# Patient Record
Sex: Female | Born: 2000 | Race: White | Hispanic: No | Marital: Married | Smoking: Never smoker
Health system: Southern US, Community
[De-identification: ages and names within clinical notes are randomized; demographics above are authoritative.]

## PROBLEM LIST (undated history)

## (undated) ENCOUNTER — Inpatient Hospital Stay (HOSPITAL_COMMUNITY): Payer: Self-pay

## (undated) DIAGNOSIS — J45909 Unspecified asthma, uncomplicated: Secondary | ICD-10-CM

## (undated) DIAGNOSIS — K219 Gastro-esophageal reflux disease without esophagitis: Secondary | ICD-10-CM

## (undated) DIAGNOSIS — Z87440 Personal history of urinary (tract) infections: Secondary | ICD-10-CM

## (undated) DIAGNOSIS — B9689 Other specified bacterial agents as the cause of diseases classified elsewhere: Secondary | ICD-10-CM

## (undated) DIAGNOSIS — N760 Acute vaginitis: Secondary | ICD-10-CM

## (undated) HISTORY — DX: Other specified bacterial agents as the cause of diseases classified elsewhere: N76.0

## (undated) HISTORY — DX: Other specified bacterial agents as the cause of diseases classified elsewhere: B96.89

## (undated) HISTORY — DX: Gastro-esophageal reflux disease without esophagitis: K21.9

## (undated) HISTORY — DX: Unspecified asthma, uncomplicated: J45.909

## (undated) HISTORY — PX: NO PAST SURGERIES: SHX2092

## (undated) HISTORY — PX: WISDOM TOOTH EXTRACTION: SHX21

---
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
## 2020-01-03 ENCOUNTER — Telehealth: Payer: Self-pay

## 2020-01-03 NOTE — Telephone Encounter (Signed)
Called and spoke with patient.  She states that for the last 2 days, she has been feeling overall weak and having body aches.  Also reports that she has been feeling very warm and waking up around 4 in the morning drenched in sweat for the last 2 days.  No known sick contacts, no other symptoms.  States that she has been breathing well, able to eat and drink normally.  She does not have any known Covid exposure.  She has been vaccinated for Covid.  Advised that given current COVID-19 pandemic, it would be important to rule out Covid as the diagnosis.  We will have her get tested for this.  Offered her the information through [facility], but she states that she has her own test that she will be using.  Also advised that she can continue to take Motrin 400 mg every 6 hours and can alternate with Tylenol.  ED/return precautions given including worsening symptoms, no improvement over the next week, development of shortness of breath, decreased p.o. intake.  She voiced understanding.  She will let us know if her symptoms do not improve.

## 2020-01-03 NOTE — Telephone Encounter (Signed)
Patient calls nurse line reporting back pain for the past 2 days. Patients reports she has been feeling weak and has been waking up "hot," however unsure if she has a fever. Patient denies any cough, congestion, or SOB. Patient denies any sick contacts or exposures. Patient has been taking (2) 200mg  Motrin 2x daily with no relief and using a heating pad with minimal relief. I advised patient to call back if she develops a fever or any other symptoms. In the meantime, she is wondering if she can increase Motrin or take something else specifically for her back pain. Please advise.

## 2020-01-28 ENCOUNTER — Ambulatory Visit (INDEPENDENT_AMBULATORY_CARE_PROVIDER_SITE_OTHER): Payer: Medicaid Other | Admitting: Family Medicine

## 2020-01-28 ENCOUNTER — Other Ambulatory Visit: Payer: Self-pay

## 2020-01-28 VITALS — BP 100/68 | HR 77 | Ht 66.0 in | Wt 165.8 lb

## 2020-01-28 DIAGNOSIS — F32A Depression, unspecified: Secondary | ICD-10-CM

## 2020-01-28 DIAGNOSIS — F419 Anxiety disorder, unspecified: Secondary | ICD-10-CM | POA: Diagnosis not present

## 2020-01-28 DIAGNOSIS — E611 Iron deficiency: Secondary | ICD-10-CM | POA: Diagnosis not present

## 2020-01-28 DIAGNOSIS — M545 Low back pain, unspecified: Secondary | ICD-10-CM | POA: Insufficient documentation

## 2020-01-28 MED ORDER — PRENATAL/IRON PO TABS: 1.0000 | ORAL_TABLET | Freq: Every day | ORAL | 3 refills | Status: DC

## 2020-01-28 NOTE — Assessment & Plan Note (Signed)
Given that she has not been able to tolerate ferrous sulfate by mouth even when separated out and taken with food, will go ahead and change her to prenatal vitamins with iron.  When she is able to tolerate for at least 6 weeks, then can recheck levels.

## 2020-01-28 NOTE — Assessment & Plan Note (Signed)
No red flag symptoms.  Does not seem to have any red flag symptoms on exam.  This seems to be muscular pain.  Discussed strengthening of core muscles and continued stretching.  She will work on this and follow-up as needed.  Can continue to use heat.  Can also use ibuprofen as needed.

## 2020-01-28 NOTE — Assessment & Plan Note (Signed)
Overall, her symptoms do seem to be improving, however she is having a grief reaction secondary to the loss of her uncle.  No SI.  She does not want to make changes to her medications at this time.  She is thinking about therapy, continue to encourage this.  She reports that she has a list on her my chart and will use this.  Follow-up in 1 month.

## 2020-01-28 NOTE — Patient Instructions (Signed)
Thank you for coming to see me today. It was a pleasure. Today we talked about:   Keep taking your medications for anxiety.  You can take Buspar twice daily as needed, you don't have to schedule it every day.  Please call a therapist to schedule and appointment.  Take the prenatal vitamins for your iron.  Stretch your back every day and work on core strengthening.  Please follow-up with me in 1 month.  If you have any questions or concerns, please do not hesitate to call the office at [phone].  Best,   Luis Abed, DO

## 2020-01-28 NOTE — Progress Notes (Addendum)
SUBJECTIVE:   CHIEF COMPLAINT / HPI:   Follow-up anxiety Last seen on 12/25/2019 Has been on Zoloft 50 mg daily, added BuSpar 5 mg twice daily on 9/28 She reports that overall she feels that the medication is working, however she is having a grief reaction due to the loss of her uncle 2 months ago She states that she thought she would be better by now, but is starting to think about therapy given that this continues to be a problem for her and she continues to struggle with the loss She denies SI GAD 7 : Generalized Anxiety Score 01/28/2020 12/25/2019 11/12/2019 10/03/2019  Nervous, Anxious, on Edge 1 2 2 1   Control/stop worrying 1 2 3 3   Worry too much - different things 1 2 3 3   Trouble relaxing 1 2 2 2   Restless 0 0 0 0  Easily annoyed or irritable 2 2 1 1   Afraid - awful might happen 1 1 2 2   Total GAD 7 Score 7 11 13 12   Anxiety Difficulty - Very difficult Very difficult Extremely difficult      Office Visit from 01/28/2020 in [facility]  PHQ-9 Total Score 7     Iron deficiency Patient has been attempting to take ferrous sulfate every other day but continues to have abdominal pain and discomfort from this  Back Pain 1.5 months Feels tight on both sides of her back No radiation of pain, numbness and tingling in her legs, saddle anesthesias, bowel or bladder habit changes, fevers Reports that the pain feels very tight and sore, sometimes a stabbing pain in her back No injury that she can think of Tries to work on stretching, sometimes makes the pain feel a little bit worse when she stretches forward Has been using a heating pad which helps for a little bit, then the pain comes back  PERTINENT  PMH / PSH: Hashimoto's thyroiditis, chronic enteritis, vitamin D deficiency, anxiety and depression, iron deficiency  OBJECTIVE:   BP 100/68   Pulse 77   Ht 5\' 6"  (1.676 m)   Wt 165 lb 12.8 oz (75.2 kg)   LMP 01/08/2020   SpO2 99%   BMI 26.76 kg/m     Physical Exam:  General: 19 y.o. female in NAD Lungs: Breathing comfortably on room air Skin: warm and dry Psych: Intermittently tearful throughout exam when speaking about the loss of her uncle, appropriate dress, no SI, thought process linear and logical  Back exam: Inspection: No gross deformities of lumbar spine noted, no overlying skin changes. Palpation: No tenderness palpation along lumbar spinous processes, no step-off noted.  Mild tenderness to palpation of paraspinal musculature bilaterally in lumbar region. ROM: Full ROM in all fields of low back, some tightness with flexion, mild pain with extension, no radiating symptoms Strength: 5/5 strength BLE Special Tests: Negative seated straight leg raise and straight leg raise bilaterally, equivocal stork sign Neurovascular: Intact distally bilaterally    ASSESSMENT/PLAN:   Anxiety and depression Overall, her symptoms do seem to be improving, however she is having a grief reaction secondary to the loss of her uncle.  No SI.  She does not want to make changes to her medications at this time.  She is thinking about therapy, continue to encourage this.  She reports that she has a list on her my chart and will use this.  Follow-up in 1 month.  Iron deficiency Given that she has not been able to tolerate ferrous sulfate by  mouth even when separated out and taken with food, will go ahead and change her to prenatal vitamins with iron.  When she is able to tolerate for at least 6 weeks, then can recheck levels.  Acute bilateral low back pain without sciatica No red flag symptoms.  Does not seem to have any red flag symptoms on exam.  This seems to be muscular pain.  Discussed strengthening of core muscles and continued stretching.  She will work on this and follow-up as needed.  Can continue to use heat.  Can also use ibuprofen as needed.     Unknown Jim, DO [facility]

## 2020-01-29 ENCOUNTER — Other Ambulatory Visit: Payer: Self-pay | Admitting: *Deleted

## 2020-01-29 DIAGNOSIS — F32A Depression, unspecified: Secondary | ICD-10-CM

## 2020-01-29 DIAGNOSIS — F419 Anxiety disorder, unspecified: Secondary | ICD-10-CM

## 2020-01-29 MED ORDER — SERTRALINE HCL 50 MG PO TABS: 50.0000 mg | ORAL_TABLET | Freq: Every day | ORAL | 2 refills | Status: DC

## 2020-02-18 ENCOUNTER — Other Ambulatory Visit: Payer: Self-pay

## 2020-02-18 ENCOUNTER — Encounter: Payer: Self-pay | Admitting: Family Medicine

## 2020-02-18 ENCOUNTER — Ambulatory Visit (INDEPENDENT_AMBULATORY_CARE_PROVIDER_SITE_OTHER): Payer: Medicaid Other | Admitting: Family Medicine

## 2020-02-18 VITALS — BP 106/68 | HR 77 | Ht 66.0 in | Wt 168.2 lb

## 2020-02-18 DIAGNOSIS — F32A Depression, unspecified: Secondary | ICD-10-CM | POA: Diagnosis not present

## 2020-02-18 DIAGNOSIS — F419 Anxiety disorder, unspecified: Secondary | ICD-10-CM

## 2020-02-18 DIAGNOSIS — E611 Iron deficiency: Secondary | ICD-10-CM

## 2020-02-18 MED ORDER — BUSPIRONE HCL 5 MG PO TABS: 5.0000 mg | ORAL_TABLET | Freq: Two times a day (BID) | ORAL | 1 refills | Status: DC

## 2020-02-18 NOTE — Patient Instructions (Signed)
Thank you for coming to see me today. It was a pleasure. Today we talked about:   [facility] would likely be a good option.  Keep taking zoloft.  You can use buspar as needed.  We will recheck your blood levels at the next visit.  Please follow-up with me in 4-6 weeks.  If you have any questions or concerns, please do not hesitate to call the office at [phone].  Best,   Luis Abed, DO   Therapy and Counseling Resources Most providers on this list will take Medicaid. Patients with commercial insurance or Medicare should contact their insurance company to get a list of in network providers.  BestDay:Psychiatry and Counseling [address] [place] [place], [place] [address] [phone]  [facility]  [address] [place], [place] [address]      [phone]  [facility] [address]  [place], [place] [address] [phone]  [facility] [address], [address]  [place], [place] [address]       [phone]      Jovita Kussmaul [facility] [address] [place], [place] [phone]  [facility]:  [address] [place] [phone]  [facility]:  [address] [place]ie Diener [phone]  [facility] (under & uninsured) [address]   [place] [phone]    [email or web address]    [facility] [address] . [place]    [phone]  [facility] -[address]     Phone:  [phone]     [facility]-  [address]  [phone]   [facility] [address]      [place], [place] [phone]  [facility]: [address] [place], [place], [place]  [phone]   [facility] (Spanish therapist) [email or web address]  [address] [place]  [address]   [place]  [place] [address]    [phone]    [facility]   [address],  [place], [place]  [phone]   [facility]  [address] [place]  [phone]  [facility]  [address] [place], [place]        [phone]  Open Access/Walk In Clinic under & uninsured  [facility]  [address] [place], [place] [phone] Crisis [phone]  [facility] [place],  (Spanish)   [address], [place]: [phone]) 8:30 - 12; 1 - 2:30  [facility] [employer],  [address] [place], [place]    [phone]):8:30 - 12; 2 - 3PM  [facility] [employer],  [address]  [place]; [phone]):   Mon - Fri 8 AM - 5 PM  [facility] [address] [place]  MWF 12:30 to 3:00 or call to schedule an appointment  [phone]  Specific Provider options Psychology Today  [email or web address] 1. click on find a therapist  2. enter your zip code 3. left side and select or tailor a therapist for your specific need.   [facility] Provider Directory [email or web address]  (Medicaid)   Follow all drop down to find a provider  Social Support program [facility] [phone] or PhotoSolver.pl [address], [place], [place] Recovery support and educational   24- Hour  Availability:  .  Marland Kitchen [facility]  . [address] [place], [place] [employer] [phone] Crisis [phone]  . [facility] [employer] [phone]  [facility] Service  [phone]   . [facility] [employer]  [phone] (after hours)  . Therapeutic Alternative/Mobile Crisis   [phone]  . [place] National Suicide Hotline  430-533-8144 (TALK)  . Call 911 or go to emergency room  . [employer]  [phone]);  [place] and [employer]    . [facility]  [phone]); [place], [place], [place], [place], [place], [place], [place]

## 2020-02-18 NOTE — Progress Notes (Signed)
    SUBJECTIVE:   CHIEF COMPLAINT / HPI:   Follow-up anxiety and depression Seen on 11/1 Current regimen: Zoloft 50 mg daily, BuSpar 5 mg has used as needed She has used Buspar about 5 times and it helps in the moment when she gets anxious Wanted to schedule therapy, but forgot the list Overall, feels like she is improving No SI    Office Visit from 02/18/2020 in [facility]  PHQ-9 Total Score 6     GAD 7 : Generalized Anxiety Score 02/18/2020 01/28/2020 12/25/2019 11/12/2019  Nervous, Anxious, on Edge 1 1 2 2   Control/stop worrying 1 1 2 3   Worry too much - different things 1 1 2 3   Trouble relaxing 2 1 2 2   Restless 0 0 0 0  Easily annoyed or irritable 1 2 2 1   Afraid - awful might happen 1 1 1 2   Total GAD 7 Score 7 7 11 13   Anxiety Difficulty - - Very difficult Very difficult      Iron deficiency At last visit, she was advised to start prenatal vitamins with iron given that she has been unable to tolerate ferrous sulfate even at every other day dosing She is tolerating this better   PERTINENT  PMH / PSH: Hashimoto's thyroiditis, vitamin D deficiency, anxiety depression, iron deficiency  OBJECTIVE:   BP 106/68   Pulse 77   Ht 5\' 6"  (1.676 m)   Wt 168 lb 3.2 oz (76.3 kg)   LMP 02/03/2020 (Exact Date)   SpO2 100%   BMI 27.15 kg/m    Physical Exam:  General: 19 y.o. female in NAD Lungs: Breathing comfortably on room air Skin: warm and dry Extremities: No edema Psych: Mood and affect appropriate for circumstance, thought process linear and logical, no SI   ASSESSMENT/PLAN:   Anxiety and depression Overall, patient is doing well. She has been using BuSpar as needed sparingly and does well with this. Continue with Zoloft and BuSpar as needed. We will have her call to get scheduled for therapy as well, as she is now open to this. No SI. We will have her follow-up in 4 to 6 weeks.  Iron deficiency Now able to tolerate prenatal vitamins with  iron. When she has been on this for 6 weeks, recheck ferritin.     , DO [facility]

## 2020-02-18 NOTE — Assessment & Plan Note (Signed)
Now able to tolerate prenatal vitamins with iron. When she has been on this for 6 weeks, recheck ferritin.

## 2020-02-18 NOTE — Assessment & Plan Note (Signed)
Overall, patient is doing well. She has been using BuSpar as needed sparingly and does well with this. Continue with Zoloft and BuSpar as needed. We will have her call to get scheduled for therapy as well, as she is now open to this. No SI. We will have her follow-up in 4 to 6 weeks.

## 2020-03-26 ENCOUNTER — Telehealth: Payer: Medicaid Other | Admitting: Family Medicine

## 2020-04-09 ENCOUNTER — Ambulatory Visit: Payer: Medicaid Other | Admitting: Family Medicine

## 2020-04-22 NOTE — Progress Notes (Signed)
SUBJECTIVE:   CHIEF COMPLAINT / HPI:   Anxiety and depression Current regimen: Zoloft 50 mg daily, BuSpar 5 mg as needed Had talked about therapy at last visit and forgot about scheduling Feels like she has been tired the last few weeks and struggles to get out of bed Is working at [employer] for the last few months Feels like mood is doing okay and happy with medicine where it is No SI  Back Pain Doing heavy lifting at work, started in the last few weeks  Pain in lower back Uses a heating pad, but not helping as much as it used to Working on a hard floor in a Scientist, water quality toed shoes with insoles No radiation down her legs Feels a soreness and a tightness No Saddle anaesthesia, changes in bowel or bladder habits No IV drug use, no fevers Has had back pain before, but has been more frequent   Headaches Frontal Wants to shut her eyes Has nausea with it Has blurry vision with this Photophobia, phonophobia Usually happen at night Tries to eat and throws up her food Takes tylenol and it doesn't help because she vomits again Happening every other day the last two weeks Before was a few times a month Usually lasts about an hour, just lays down and closes her eyes, but doesn't sleep because it is too early  Iron deficiency Patient has been on prenatal vitamins since November for iron supplementation as she has been unable to tolerate ferrous sulfate She feels nauseous when she takes them because they are so big She has been taking them though  Covid booster - hasn't gotten it, had COVID over Christmas   PERTINENT  PMH / PSH: Hashimoto's thyroiditis, vitamin D deficiency, iron deficiency  OBJECTIVE:   BP 108/72   Pulse 74   Ht 5\' 6"  (1.676 m)   Wt 170 lb 6.4 oz (77.3 kg)   LMP 04/03/2020   SpO2 99%   BMI 27.50 kg/m    Physical Exam:  General: 20 y.o. female in NAD HEENT: PERRL, EOMI Neck: Supple, full ROM in all fields without pain Lungs: Breathing  comfortably on room air Skin: warm and dry Neuro: CN II through XII grossly intact, sensation intact throughout, 5/5 strength to BUE/BLE  Lumbar spine: - Inspection: no gross deformity or asymmetry, swelling or ecchymosis - Palpation: No TTP over the spinous processes, pain with palpation of bilateral paraspinal musculature and lumbar spine - ROM: full active ROM of the lumbar spine in flexion and extension, pain with extreme of flexion - Strength: 5/5 strength of lower extremity in L4-S1 nerve root distributions b/l; normal gait - Neuro: sensation intact in the L4-S1 nerve root distribution b/l, 2+ L4 and S1 reflexes - Special testing: Negative straight leg raise, negative Stork test    ASSESSMENT/PLAN:   Anxiety and depression No SI.  Seems to be stable, does not want to make any changes in her medications.  She has still not proceeded with therapy, continue to encourage.  We will have her follow-up in 2 months.  Continue Zoloft, BuSpar as needed.  Acute bilateral low back pain without sciatica No red flags on exam or history.  This is likely paraspinal musculature pain secondary to working on hard floors and heavy lifting.  Advised continued stretching which she does at work, also discussed Williams flexion exercises with the patient and to work on core strengthening.  She will follow-up in 2 months if no improvement.  Can use ibuprofen  or Tylenol for her pain.  Also advised continuing heat.  Migraine without aura and without status migrainosus, not intractable Headaches appear consistent with migraines, no aura.  She is neurologically intact on exam.  Given that these have been occurring more frequently in the last few weeks, will start with an abortive therapy, trial Imitrex 50 mg every 2 hours as needed for migraine with no more than use of 2 in 1 day.  If this does not keep her migraines well controlled, could consider controller medication for her.  Follow-up in 2 months.  Iron  deficiency Patient states that she thinks she would be able to tolerate her prenatal vitamins.  We will go ahead and check a ferritin level today to see where we are at.  She has been unable to tolerate iron supplementation in the past.     Unknown Jim, DO [facility]

## 2020-04-23 ENCOUNTER — Ambulatory Visit (INDEPENDENT_AMBULATORY_CARE_PROVIDER_SITE_OTHER): Payer: Medicaid Other | Admitting: Family Medicine

## 2020-04-23 ENCOUNTER — Encounter: Payer: Self-pay | Admitting: Family Medicine

## 2020-04-23 ENCOUNTER — Other Ambulatory Visit: Payer: Self-pay

## 2020-04-23 VITALS — BP 108/72 | HR 74 | Ht 66.0 in | Wt 170.4 lb

## 2020-04-23 DIAGNOSIS — F419 Anxiety disorder, unspecified: Secondary | ICD-10-CM

## 2020-04-23 DIAGNOSIS — G43009 Migraine without aura, not intractable, without status migrainosus: Secondary | ICD-10-CM

## 2020-04-23 DIAGNOSIS — E611 Iron deficiency: Secondary | ICD-10-CM | POA: Diagnosis not present

## 2020-04-23 DIAGNOSIS — F32A Depression, unspecified: Secondary | ICD-10-CM

## 2020-04-23 DIAGNOSIS — M545 Low back pain, unspecified: Secondary | ICD-10-CM

## 2020-04-23 HISTORY — DX: Migraine without aura, not intractable, without status migrainosus: G43.009

## 2020-04-23 MED ORDER — SUMATRIPTAN SUCCINATE 50 MG PO TABS: 50.0000 mg | ORAL_TABLET | ORAL | 0 refills | Status: DC | PRN

## 2020-04-23 NOTE — Assessment & Plan Note (Signed)
Patient states that she thinks she would be able to tolerate her prenatal vitamins.  We will go ahead and check a ferritin level today to see where we are at.  She has been unable to tolerate iron supplementation in the past.

## 2020-04-23 NOTE — Assessment & Plan Note (Signed)
No red flags on exam or history.  This is likely paraspinal musculature pain secondary to working on hard floors and heavy lifting.  Advised continued stretching which she does at work, also discussed Williams flexion exercises with the patient and to work on core strengthening.  She will follow-up in 2 months if no improvement.  Can use ibuprofen or Tylenol for her pain.  Also advised continuing heat.

## 2020-04-23 NOTE — Assessment & Plan Note (Signed)
Headaches appear consistent with migraines, no aura.  She is neurologically intact on exam.  Given that these have been occurring more frequently in the last few weeks, will start with an abortive therapy, trial Imitrex 50 mg every 2 hours as needed for migraine with no more than use of 2 in 1 day.  If this does not keep her migraines well controlled, could consider controller medication for her.  Follow-up in 2 months.

## 2020-04-23 NOTE — Assessment & Plan Note (Signed)
No SI.  Seems to be stable, does not want to make any changes in her medications.  She has still not proceeded with therapy, continue to encourage.  We will have her follow-up in 2 months.  Continue Zoloft, BuSpar as needed.

## 2020-04-23 NOTE — Patient Instructions (Signed)
Thank you for coming to see me today. It was a pleasure. Today we talked about:   Consider getting your COVID booster.  Consider therapy if you would like.    For your back pain, incorporate more stretching with bending forward, laying down and bringing your knees to your chest, stretching your hamstrings, and strengthening your core.  For your headaches, take imitrex at the first sign of a headache.  You can repeat in 2 hrs if not better.  Don't take more than 2 in one day.  If your headaches continue, we can start you on a daily controller medication.  We will get some labs today.  If they are abnormal or we need to do something about them, I will call you.  If they are normal, I will send you a message on MyChart (if it is active) or a letter in the mail.  If you don't hear from [place] in 2 weeks, please call the office at the number below.  Please follow-up with me in 2 months.  If you have any questions or concerns, please do not hesitate to call the office at [phone].  Best,   Luis Abed, DO

## 2020-04-24 LAB — FERRITIN: Ferritin: 19 ng/mL (ref 15–150)

## 2020-04-30 ENCOUNTER — Other Ambulatory Visit: Payer: Self-pay | Admitting: Pulmonary Disease

## 2020-05-08 ENCOUNTER — Other Ambulatory Visit: Payer: Self-pay | Admitting: Family Medicine

## 2020-05-08 DIAGNOSIS — F419 Anxiety disorder, unspecified: Secondary | ICD-10-CM

## 2020-05-08 DIAGNOSIS — F32A Depression, unspecified: Secondary | ICD-10-CM

## 2020-05-12 ENCOUNTER — Other Ambulatory Visit: Payer: Self-pay | Admitting: *Deleted

## 2020-05-12 DIAGNOSIS — F419 Anxiety disorder, unspecified: Secondary | ICD-10-CM

## 2020-05-12 MED ORDER — SERTRALINE HCL 50 MG PO TABS: 50.0000 mg | ORAL_TABLET | Freq: Every day | ORAL | 2 refills | Status: DC

## 2020-05-13 IMAGING — US US RENAL
1 series · 14 of 25 positions shown · non-contrast
Comparison: None.

CLINICAL DATA: 17-year-old female with RIGHT flank pain for 2 days.

EXAM:
RENAL / URINARY TRACT ULTRASOUND COMPLETE

[Series 1: us renal · 0.18mm/px · 14 of 34 slices shown]
[im 1/34]
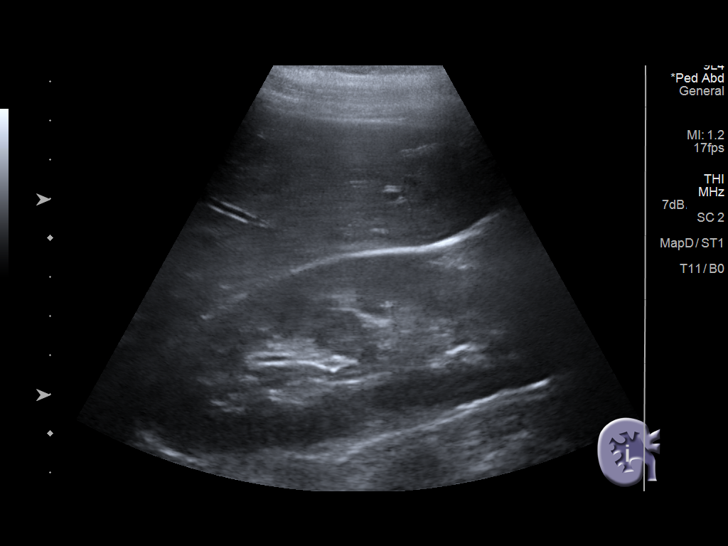
[im 3/34]
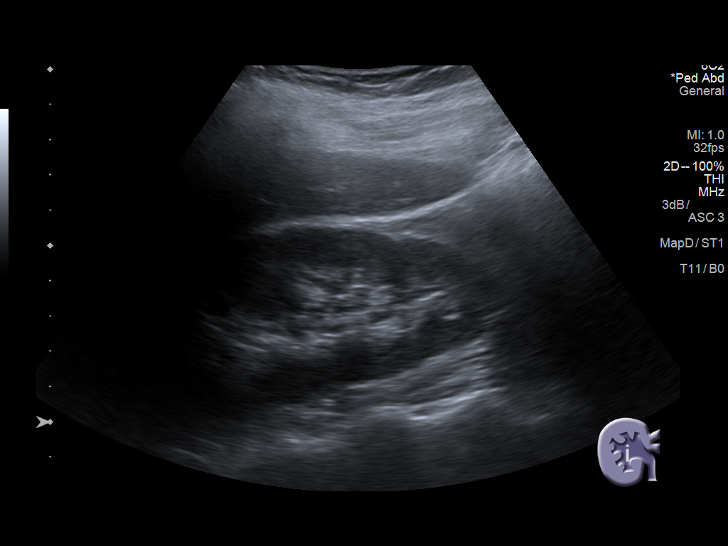
[im 6/34]
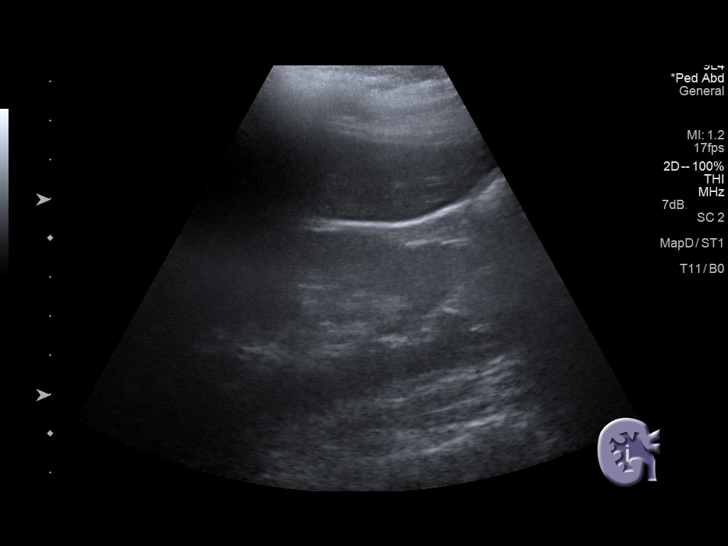
[im 9/34]
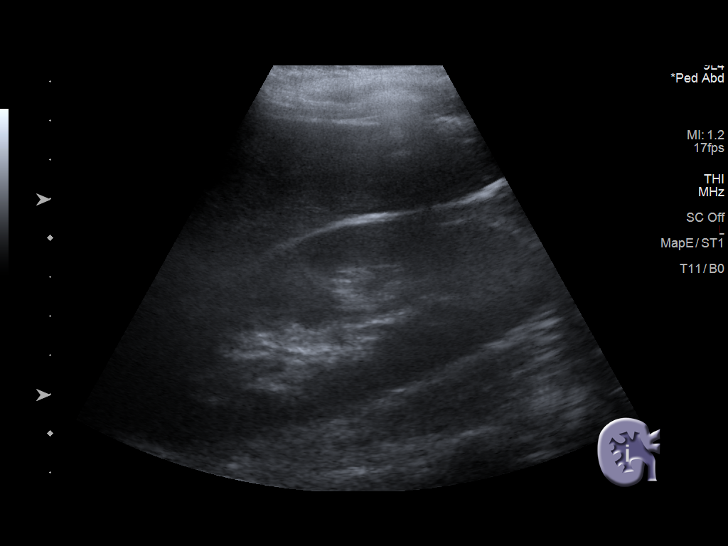
[im 12/34]
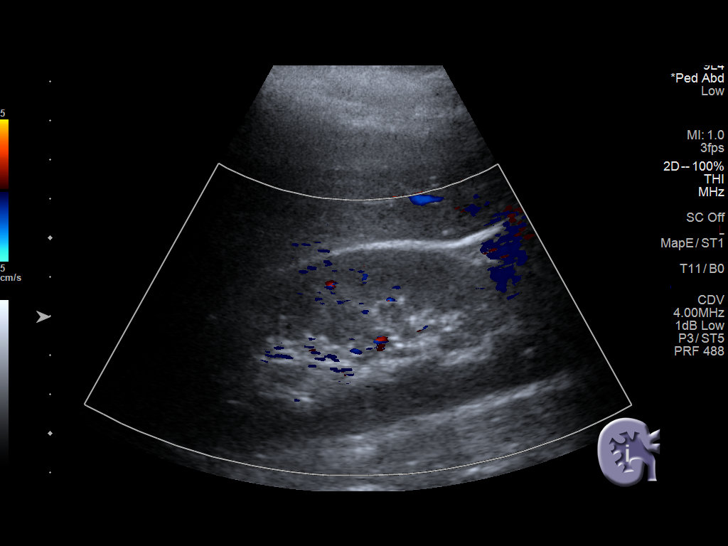
[im 13/34]
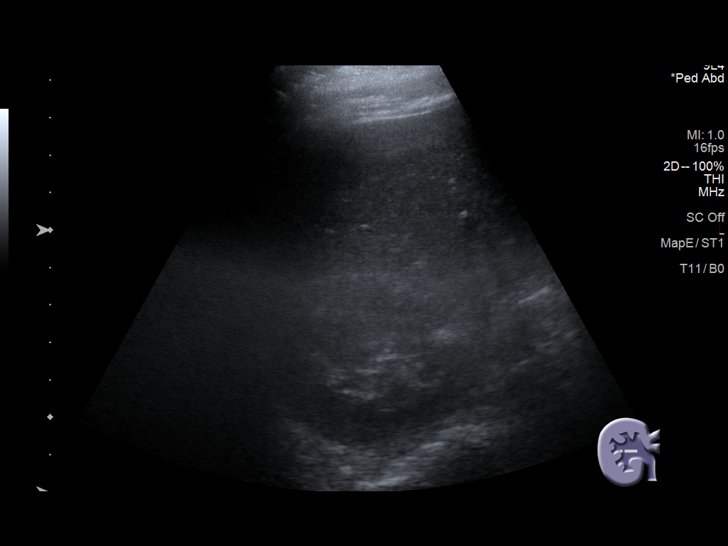
[im 16/34]
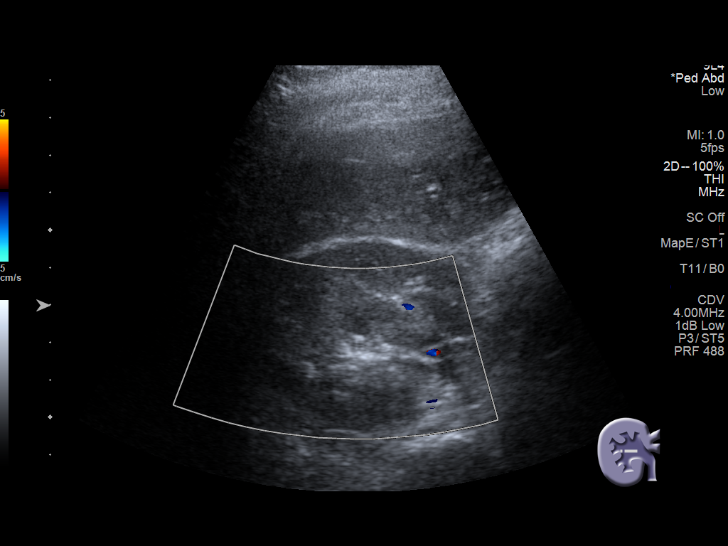
[im 18/34]
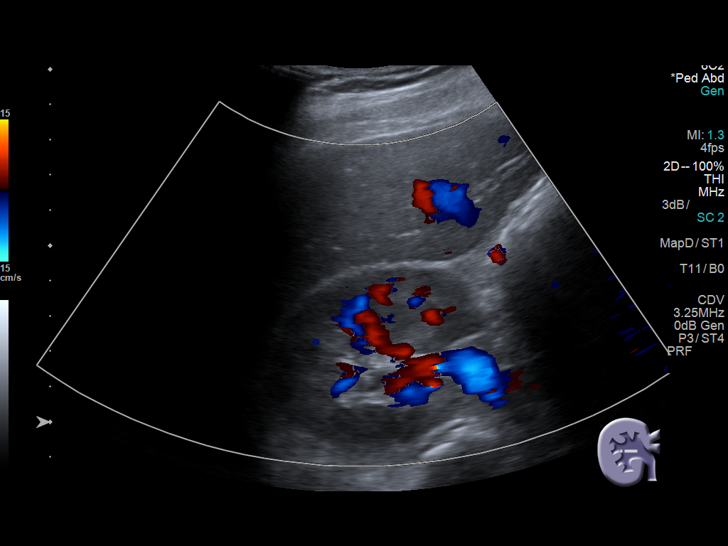
[im 21/34]
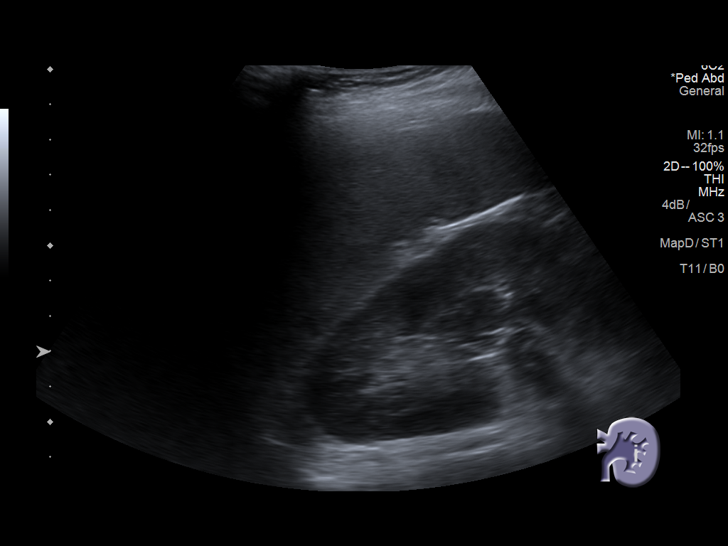
[im 23/34]
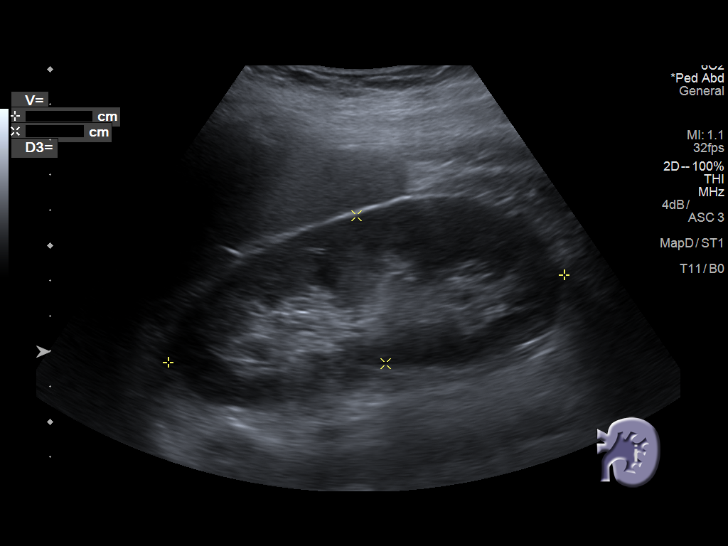
[im 25/34]
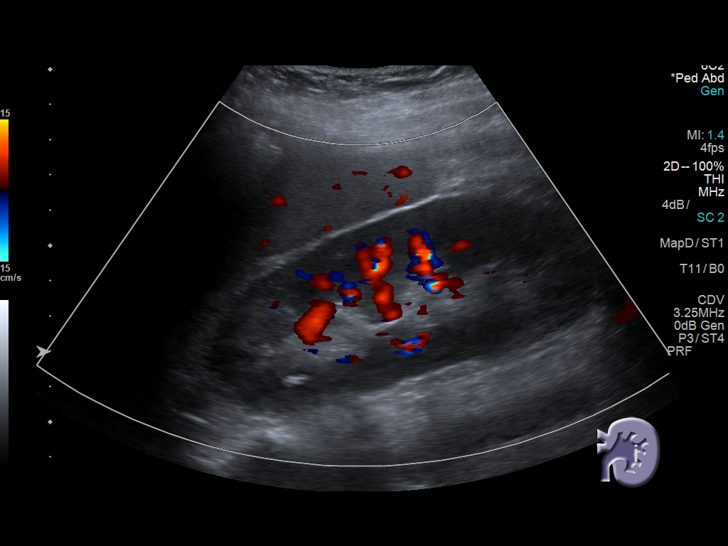
[im 28/34]
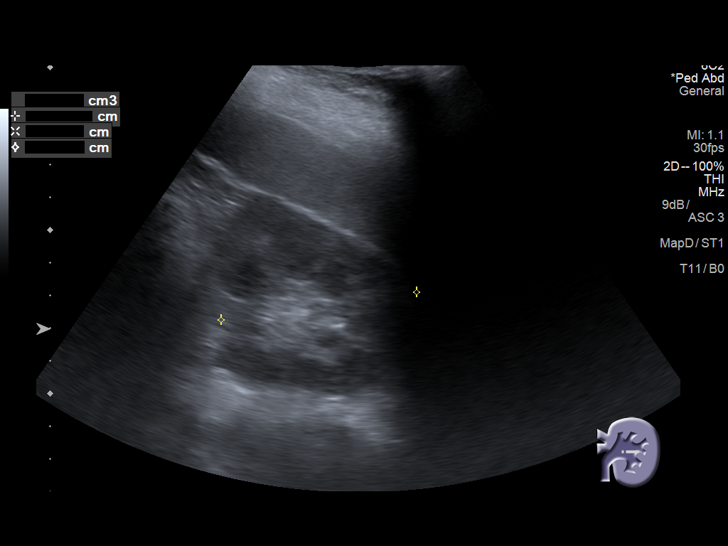
[im 31/34]
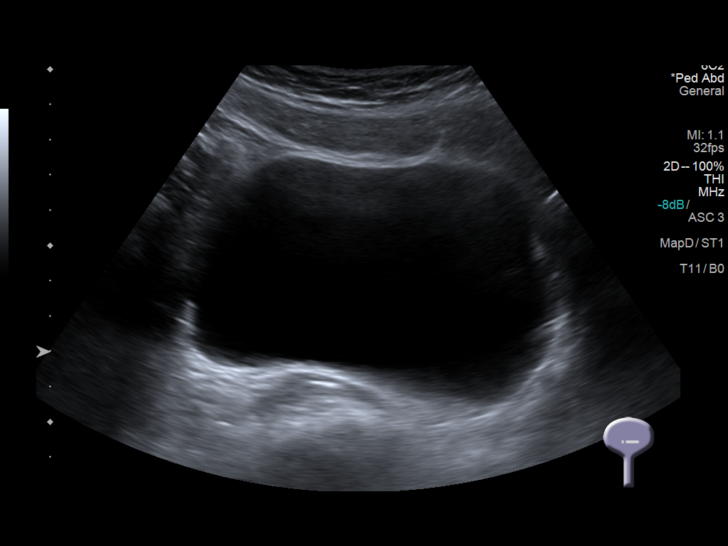
[im 34/34]
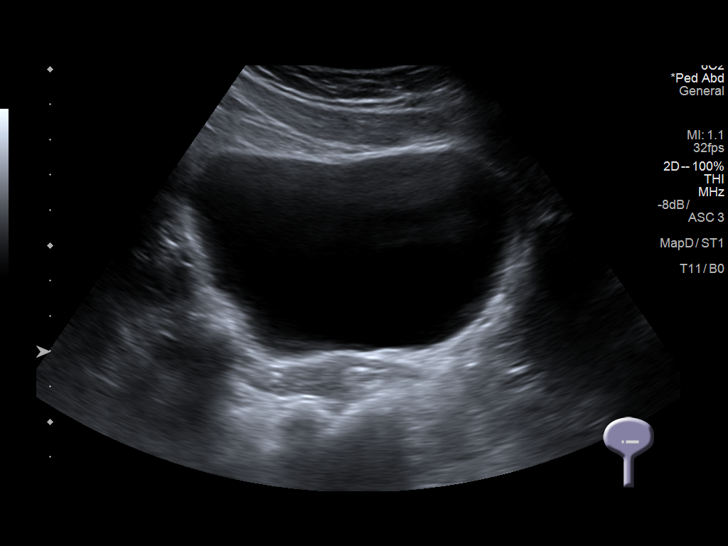

[14 of 25 positions shown; findings below may reference images not displayed]

FINDINGS: Right Kidney:

Renal measurements: 11.4 x 4.4 x 5.7 cm = volume: 149 mL .
Echogenicity within normal limits. No mass or hydronephrosis
visualized.

Left Kidney:

Renal measurements: 11.5 x 4.3 x 6 cm = volume: 155 mL. Echogenicity
within normal limits. No mass or hydronephrosis visualized.

Bladder:

Appears normal for degree of bladder distention.
IMPRESSION: Normal renal ultrasound.

## 2020-06-11 ENCOUNTER — Ambulatory Visit (INDEPENDENT_AMBULATORY_CARE_PROVIDER_SITE_OTHER): Payer: Medicaid Other | Admitting: Family Medicine

## 2020-06-11 ENCOUNTER — Other Ambulatory Visit: Payer: Self-pay

## 2020-06-11 ENCOUNTER — Encounter: Payer: Self-pay | Admitting: Family Medicine

## 2020-06-11 VITALS — BP 105/75 | HR 74 | Ht 65.0 in | Wt 169.2 lb

## 2020-06-11 DIAGNOSIS — Z833 Family history of diabetes mellitus: Secondary | ICD-10-CM | POA: Diagnosis not present

## 2020-06-11 DIAGNOSIS — F32A Depression, unspecified: Secondary | ICD-10-CM

## 2020-06-11 DIAGNOSIS — F419 Anxiety disorder, unspecified: Secondary | ICD-10-CM

## 2020-06-11 DIAGNOSIS — G43009 Migraine without aura, not intractable, without status migrainosus: Secondary | ICD-10-CM | POA: Diagnosis not present

## 2020-06-11 LAB — POCT GLYCOSYLATED HEMOGLOBIN (HGB A1C): HbA1c, POC (controlled diabetic range): 4.6 % (ref 0.0–7.0)

## 2020-06-11 NOTE — Assessment & Plan Note (Signed)
Doing well with abortive therapy.  Does not seem to have symptoms that are frequent enough to warrant a controller.  We will continue with Imitrex as needed.

## 2020-06-11 NOTE — Patient Instructions (Signed)
Thank you for coming to see me today. It was a pleasure. Today we talked about:   We will get the A1c today for diabetes screening.    Continue all of your other medications.  Please follow-up with me in 2-3 months.  If you have any questions or concerns, please do not hesitate to call the office at [phone].  Best,   Luis Abed, DO

## 2020-06-11 NOTE — Progress Notes (Signed)
    SUBJECTIVE:   CHIEF COMPLAINT / HPI:   Follow-up anxiety and depression Last seen in January Current regimen: Zoloft 50 mg daily, BuSpar 5 mg as needed No SI Doing well Has stopped worrying about things Feels like she is okay with current treatment She and her boyfriend recently moved out of her parents' house She is now in a supervising role at [employer]  GAD 7 : Generalized Anxiety Score 06/11/2020 04/23/2020 02/18/2020 01/28/2020  Nervous, Anxious, on Edge 1 1 1 1   Control/stop worrying 2 1 1 1   Worry too much - different things 2 1 1 1   Trouble relaxing 1 2 2 1   Restless 0 0 0 0  Easily annoyed or irritable 2 1 1 2   Afraid - awful might happen 2 2 1 1   Total GAD 7 Score 10 8 7 7   Anxiety Difficulty - Very difficult - -      Migraine follow-up Patient was started on Imitrex at last visit Has used three times in the last two months with good improvement Has also had improvement in her symptoms with decreased anxiety and worrying  Family history of DM Would like testing for this Uncle died from DM No polyuria, polydipsia   PERTINENT  PMH / PSH: Migraine, Hashimoto's thyroiditis, vitamin D deficiency, anxiety and depression, iron deficiency  OBJECTIVE:   BP 105/75   Pulse 74   Ht 5\' 5"  (1.651 m)   Wt 169 lb 3.2 oz (76.7 kg)   LMP 05/27/2020   SpO2 98%   BMI 28.16 kg/m    Physical Exam:  General: 20 y.o. female in NAD Lungs: Breathing comfortably on room air Skin: warm and dry Psych: Mood and affect appropriate for circumstance, appropriate dress, thought process linear and logical, good insight, no SI  Results for orders placed or performed in visit on 06/11/20 (from the past 24 hour(s))  HgB A1c     Status: None   Collection Time: 06/11/20  2:09 PM  Result Value Ref Range   Hemoglobin A1C     HbA1c POC (<> result, manual entry)     HbA1c, POC (prediabetic range)     HbA1c, POC (controlled diabetic range) 4.6 0.0 - 7.0 %      ASSESSMENT/PLAN:    Anxiety and depression Stable.  Overall doing well.  No changes to her current regimen.  No SI.  Continue Zoloft and BuSpar as needed.  Migraine without aura and without status migrainosus, not intractable Doing well with abortive therapy.  Does not seem to have symptoms that are frequent enough to warrant a controller.  We will continue with Imitrex as needed.  Family history of diabetes mellitus Patient would like to be tested for diabetes, will order an A1c today.  She has a significant family history of diabetes, states "everyone in my family except my father has diabetes."  Has had prior CBGs that are elevated, one at 145.  Do not think that these were fasting, but unsure.   She will follow-up in 2 to 3 months.  , DO [facility]

## 2020-06-11 NOTE — Assessment & Plan Note (Signed)
Patient would like to be tested for diabetes, will order an A1c today.  She has a significant family history of diabetes, states "everyone in my family except my father has diabetes."  Has had prior CBGs that are elevated, one at 145.  Do not think that these were fasting, but unsure.

## 2020-06-11 NOTE — Assessment & Plan Note (Signed)
Stable.  Overall doing well.  No changes to her current regimen.  No SI.  Continue Zoloft and BuSpar as needed.

## 2020-06-14 ENCOUNTER — Other Ambulatory Visit: Payer: Self-pay | Admitting: Pulmonary Disease

## 2020-06-20 ENCOUNTER — Encounter: Payer: Self-pay | Admitting: Family Medicine

## 2020-07-02 ENCOUNTER — Ambulatory Visit (INDEPENDENT_AMBULATORY_CARE_PROVIDER_SITE_OTHER): Payer: Medicaid Other | Admitting: Family Medicine

## 2020-07-02 ENCOUNTER — Other Ambulatory Visit: Payer: Self-pay | Admitting: Family Medicine

## 2020-07-02 ENCOUNTER — Other Ambulatory Visit: Payer: Self-pay

## 2020-07-02 ENCOUNTER — Ambulatory Visit
Admission: RE | Admit: 2020-07-02 | Discharge: 2020-07-02 | Disposition: A | Discharge status: Home / Self Care | Payer: Medicaid Other | Source: Ambulatory Visit | Attending: Family Medicine | Admitting: Family Medicine

## 2020-07-02 VITALS — BP 100/60 | HR 73 | Ht 65.0 in | Wt 169.0 lb

## 2020-07-02 DIAGNOSIS — M25562 Pain in left knee: Secondary | ICD-10-CM | POA: Diagnosis not present

## 2020-07-02 DIAGNOSIS — M25561 Pain in right knee: Secondary | ICD-10-CM

## 2020-07-02 DIAGNOSIS — R1013 Epigastric pain: Secondary | ICD-10-CM

## 2020-07-02 MED ORDER — PANTOPRAZOLE SODIUM 40 MG PO TBEC: 40.0000 mg | DELAYED_RELEASE_TABLET | Freq: Every day | ORAL | 3 refills | Status: DC

## 2020-07-02 MED ORDER — MELOXICAM 7.5 MG PO TABS: 7.5000 mg | ORAL_TABLET | Freq: Every day | ORAL | 0 refills | Status: DC

## 2020-07-02 NOTE — Assessment & Plan Note (Addendum)
Patient with acute pain of both knees.  Denies history of trauma, accidents, falls.  Most likely due to patellofemoral syndrome.  Potentially some LCL laxity of the left knee.  No concern for mixed connective tissue disease or hypermobility. -X-rays ordered of patient's knees with standing view -Physical therapy ordered for patient -Mobic also ordered for patient 7.5 mg daily, as patient is currently taking Zoloft we will asked patient to take Protonix 40 mg daily while taking Mobic.  Recommend discontinuing Protonix within 8 weeks. -Review x-rays, patient to follow-up with PCP May 17

## 2020-07-02 NOTE — Patient Instructions (Addendum)
Thank you for coming in to see [place] today! Please see below to review our plan for today's visit:  1. Get Knee xrays ASAP at [facility]. 2. Take Mobic once daily for pain. Also be sure to take Protonix/Pantoprazole as Mobic can potentially increase risk for GI bleed when taken with Zoloft. Protonix helps to decrease that risk.  3. Continue to rest and ice your knee when you're able.  4. Look out for calls from Physical therapy.   Follow up in 4-6 weeks.   Please call the clinic at [phone] if your symptoms worsen or you have any concerns. It was our pleasure to serve you!   Dr. Peggyann Shoals [facility]

## 2020-07-02 NOTE — Progress Notes (Signed)
    SUBJECTIVE:   CHIEF COMPLAINT / HPI:   Knee pain, swelling: Patient presents to clinic today with history over the last 1-2 months of knee pain and swelling.  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  She rested, elevated her legs, was given a knee brace.  She went home shortly after that to continue resting her lower extremities and her knee pain improved after about an hour.  She does not do any physical exercise other than walking around at work.  She denies any falls or trauma to her knee.  She reports that the pain is worse in the left knee than in the right, feels sometimes as if the bone is grinding together.  My chart message was sent to patient's PCP who recommended referral to physical therapy.     PERTINENT  PMH / PSH:  Patient Active Problem List   Diagnosis Date Noted  . Acute pain of both knees 07/02/2020  . Family history of diabetes mellitus 06/11/2020  . Migraine without aura and without status migrainosus, not intractable 04/23/2020  . Acute bilateral low back pain without sciatica 01/28/2020  . Iron deficiency 12/26/2019  . Low mean corpuscular volume (MCV) 11/12/2019  . Anxiety and depression 10/04/2019  . Hashimoto's thyroiditis 08/30/2019  . Vitamin D deficiency 08/30/2019  . Difficulty breathing 08/30/2019  . Dyspepsia 08/30/2019  . Encounter for counseling regarding contraception 08/30/2019     OBJECTIVE:   BP 100/60   Pulse 73   Ht 5\' 5"  (1.651 m)   Wt 169 lb (76.7 kg)   LMP 06/25/2020   SpO2 99%   BMI 28.12 kg/m    Physical exam: General: Well-appearing, no acute distress Respiratory: CTA bilaterally, comfortable work of breathing Hips: normal ROM, negative FABER and FADIR bilaterally Bilateral knees: - Inspection: no gross deformity. No swelling/effusion, erythema or bruising. Skin intact - Palpation: Mild TTP surrounding  patella - ROM: full active ROM with flexion and extension in knee and hip - Strength: 5/5 strength - Neuro/vasc: NV intact - Special Tests: - LIGAMENTS: negative anterior and posterior drawer, negative Lachman's, no MCL laxity, questionable LCL laxity of left knee (no LCL laxity of right knee) -- MENISCUS: negative Thessaly  -- PF JOINT: nml patellar mobility bilaterally.  Pain with patellar apprehension   ASSESSMENT/PLAN:   Acute pain of both knees Patient with acute pain of both knees.  Denies history of trauma, accidents, falls.  Most likely due to patellofemoral syndrome.  Potentially some LCL laxity of the left knee.  No concern for mixed connective tissue disease or hypermobility. -X-rays ordered of patient's knees with standing view -Physical therapy ordered for patient -Mobic also ordered for patient 7.5 mg daily, as patient is currently taking Zoloft we will asked patient to take Protonix 40 mg daily while taking Mobic.  Recommend discontinuing Protonix within 8 weeks. -Review x-rays, patient to follow-up with PCP May 17     May 19, DO [facility]

## 2020-07-04 ENCOUNTER — Other Ambulatory Visit: Payer: Self-pay | Admitting: Pulmonary Disease

## 2020-07-08 ENCOUNTER — Encounter: Payer: Self-pay | Admitting: Family Medicine

## 2020-07-09 ENCOUNTER — Encounter: Payer: Self-pay | Admitting: Family Medicine

## 2020-07-23 ENCOUNTER — Ambulatory Visit: Payer: Medicaid Other | Attending: Family Medicine | Admitting: Physical Therapy

## 2020-07-23 ENCOUNTER — Other Ambulatory Visit: Payer: Self-pay

## 2020-07-23 ENCOUNTER — Encounter: Payer: Self-pay | Admitting: Physical Therapy

## 2020-07-23 DIAGNOSIS — M25561 Pain in right knee: Secondary | ICD-10-CM | POA: Diagnosis present

## 2020-07-23 DIAGNOSIS — M25562 Pain in left knee: Secondary | ICD-10-CM | POA: Diagnosis present

## 2020-07-23 DIAGNOSIS — G8929 Other chronic pain: Secondary | ICD-10-CM | POA: Diagnosis present

## 2020-07-23 DIAGNOSIS — M6281 Muscle weakness (generalized): Secondary | ICD-10-CM | POA: Diagnosis present

## 2020-07-23 NOTE — Therapy (Signed)
[facility] [address] [place], [place], [address] Phone: [phone]   Fax:  [phone]  Physical Therapy Evaluation  Patient Details  Name: Joan Kim MRN: [id number] Date of Birth: 28-Apr-2000 Referring Provider (PT): Janit Pagan T   Encounter Date: 07/23/2020   PT End of Session - 07/23/20 1136    Visit Number 1    Number of Visits 12    Date for PT Re-Evaluation 09/03/20    Authorization Type UHC MCD    PT Start Time 1017    PT Stop Time 1100    PT Time Calculation (min) 43 min    Activity Tolerance Patient tolerated treatment well    Behavior During Therapy [facility] for tasks assessed/performed           Past Medical History:  Diagnosis Date  . Asthma   . Bacterial vaginitis   . GERD (gastroesophageal reflux disease)   . H/O bladder infections   . Helicobacter pylori antibody positive 03/2019    Past Surgical History:  Procedure Laterality Date  . NO PAST SURGERIES      There were no vitals filed for this visit.    Subjective Assessment - 07/23/20 1028    Subjective Pt reports that she started having knee pain about a month and a half ago.  She works at [employer] and [employer] and is on her feet all day.  Sudden onset which has increased in severity over the last 1.5 months.  Denies any trauma.  She endorses clicking and popping with some locking.  Her knee pain can be in either knee or both and follows no clear pattern.  Activity does not neccessarily make it worse.  Describes grinding under knee under knee cap intermittently.  She notes some swelling after working.    Limitations Standing;Walking;Sitting    How long can you sit comfortably? <1 hr    How long can you stand comfortably? 1.5 hrs    How long can you walk comfortably? 1 hrs    Patient Stated Goals Reduce knee pain, stand for 5 hours standing and lifting    Currently in Pain? Yes    Pain Score 5     Pain Location Knee    Pain  Orientation Other (Comment)   bil   Pain Descriptors / Indicators Throbbing;Other (Comment)   grinding             [facility] PT Assessment - 07/23/20 0001      Assessment   Medical Diagnosis bil knee pain    Referring Provider (PT) Janit Pagan T    Onset Date/Surgical Date 05/26/20      Precautions   Precautions None      Restrictions   Weight Bearing Restrictions No      Balance Screen   Has the patient fallen in the past 6 months No      Home Environment   Living Environment Private residence    Living Arrangements Parent    Home Access Stairs to enter      Prior Function   Level of Independence Independent    Vocation Full time employment    Education administrator - lifting, standing, walking    Leisure playing with baby brother      Functional Tests   Functional tests Other   Weakness in bil plantar flexion noted with toe walking, squat is painful and limited to 87 degreees with minor valgus colapse noted     ROM /  Strength   AROM / PROM / Strength AROM;PROM;Strength      AROM   AROM Assessment Site Knee;Ankle    Right/Left Knee --   WNL   Right Knee Extension --    Right/Left Ankle Right;Left    Right Ankle Dorsiflexion 13   in WB   Left Ankle Dorsiflexion 20   in WB     Strength   Strength Assessment Site Knee;Hip    Right/Left Hip Right;Left    Right Hip Flexion 5/5    Right Hip Extension 3+/5    Right Hip ABduction 4/5    Left Hip Flexion 5/5    Left Hip Extension 3+/5    Left Hip ABduction 3+/5    Right/Left Knee Right;Left    Right Knee Flexion 4/5    Right Knee Extension 3+/5    Left Knee Flexion 4/5   w/ pain   Left Knee Extension 3+/5   w/pain     Flexibility   Hamstrings WNL    Quadriceps WNL    ITB WNL      Special Tests    Special Tests Knee Special Tests    Other special tests McMurrays (-) bil, ligamentous instability (-) bil      Ambulation/Gait   Ambulation/Gait --   Reduced heel strike bil, bil ankle pronation during  gait, mild knee valgus during gait                     Objective measurements completed on examination: See above findings.               PT Education - 07/23/20 1123    Education Details Pt educated on PT/POC and improtance of LE strengthening    Person(s) Educated Patient    Methods Explanation;Demonstration;Handout    Comprehension Verbalized understanding            PT Short Term Goals - 07/23/20 1131      PT SHORT TERM GOAL #1   Title Pt will be 80% compliant with HEP    Baseline No HEP    Time 3    Period Weeks    Status New    Target Date 08/13/20             PT Long Term Goals - 07/23/20 1132      PT LONG TERM GOAL #1   Title Pt will demonstrate MMT of >=4/5 for bil knee ext, hip ext, and hip abductotion    Baseline see flowsheet    Time 6    Period Weeks    Status New    Target Date 09/03/20      PT LONG TERM GOAL #2   Title Pt will be able to work for a full day, not limited by pain    Time 6    Period Weeks    Status New    Target Date 09/03/20      PT LONG TERM GOAL #3   Title LEFS Goal next session    Time 6    Period Weeks    Status New    Target Date 09/03/20                  Plan - 07/23/20 1125    Clinical Impression Statement Pt presents to clinic with signs and sxs consistent with bil PFPS secondary to LE muscle weakness.  She has pain and deficits in hip extensor strength, knee extensor strength, hip abduction strength, and  gait.  She is limited functionally in squatting, lifting, stairs, and working.  She demonstrates moderate pronation during gait and orthotics may be considered if strengthening is not effective.  She will benefit from skilled PT to address pain and her listed deficits in order to improve function.    Personal Factors and Comorbidities Fitness    Examination-Activity Limitations Lift;Stand;Squat;Stairs    Examination-Participation Restrictions Occupation    Stability/Clinical Decision  Making Stable/Uncomplicated    Clinical Decision Making Low    Rehab Potential Good    PT Frequency 2x / week    PT Duration 4 weeks    PT Treatment/Interventions ADLs/Self Care Home Management;Gait training;Therapeutic activities;Therapeutic exercise;Manual techniques;Neuromuscular re-education;Iontophoresis 4mg /ml Dexamethasone    PT Next Visit Plan work on:  R DF ROM; hip abduction, hip ext, hip abduction strength    PT Home Exercise Plan [email or web address]    Consulted and Agree with Plan of Care Patient           Patient will benefit from skilled therapeutic intervention in order to improve the following deficits and impairments:  Abnormal gait,Decreased activity tolerance,Pain,Decreased strength  Visit Diagnosis: Chronic pain of both knees  Muscle weakness (generalized)     Problem List Patient Active Problem List   Diagnosis Date Noted  . Acute pain of both knees 07/02/2020  . Family history of diabetes mellitus 06/11/2020  . Migraine without aura and without status migrainosus, not intractable 04/23/2020  . Acute bilateral low back pain without sciatica 01/28/2020  . Iron deficiency 12/26/2019  . Low mean corpuscular volume (MCV) 11/12/2019  . Anxiety and depression 10/04/2019  . Hashimoto's thyroiditis 08/30/2019  . Vitamin D deficiency 08/30/2019  . Difficulty breathing 08/30/2019  . Dyspepsia 08/30/2019  . Encounter for counseling regarding contraception 08/30/2019    10/30/2019 PT, DPT 07/23/20 11:47 AM  [facility] [address] [place], [place], [place] Phone: [phone]   Fax:  [phone]  Name: NILAH BELCOURT MRN: Brand Males Date of Birth: [birth year]  Check all possible CPT codes: 97110- Therapeutic Exercise, [phone]- Neuro Re-education, [phone] - Gait Training, [phone] - Manual Therapy, [id number] - Therapeutic Activities and [phone] - Self Care

## 2020-07-23 NOTE — Patient Instructions (Addendum)
    HOLDING ON CLAMSHELL UNTIL NEXT VISIT

## 2020-07-30 ENCOUNTER — Ambulatory Visit: Payer: Medicaid Other | Attending: Family Medicine

## 2020-07-30 ENCOUNTER — Other Ambulatory Visit: Payer: Self-pay

## 2020-07-30 DIAGNOSIS — M6281 Muscle weakness (generalized): Secondary | ICD-10-CM | POA: Insufficient documentation

## 2020-07-30 DIAGNOSIS — M25562 Pain in left knee: Secondary | ICD-10-CM | POA: Diagnosis present

## 2020-07-30 DIAGNOSIS — G8929 Other chronic pain: Secondary | ICD-10-CM | POA: Diagnosis present

## 2020-07-30 DIAGNOSIS — M25561 Pain in right knee: Secondary | ICD-10-CM | POA: Diagnosis present

## 2020-07-30 NOTE — Therapy (Signed)
[facility] [address] [place], [place], [address] Phone: [phone]   Fax:  [phone]  Physical Therapy Treatment  Patient Details  Name: Joan Kim MRN: [id number] Date of Birth: Nov 21, 2000 Referring Provider (PT): Janit Pagan T   Encounter Date: 07/30/2020   PT End of Session - 07/30/20 1358    Visit Number 2    Number of Visits 12    Date for PT Re-Evaluation 09/03/20    Authorization Type UHC MCD    PT Start Time 1359    PT Stop Time 1438    PT Time Calculation (min) 39 min    Activity Tolerance Patient tolerated treatment well    Behavior During Therapy [facility] for tasks assessed/performed           Past Medical History:  Diagnosis Date  . Asthma   . Bacterial vaginitis   . GERD (gastroesophageal reflux disease)   . H/O bladder infections   . Helicobacter pylori antibody positive 03/2019    Past Surgical History:  Procedure Laterality Date  . NO PAST SURGERIES      There were no vitals filed for this visit.   Subjective Assessment - 07/30/20 1358    Subjective Pt presents to PT with no current reports of bilateral knee pain, although she states "pain has been getting worse". Pt has been compliant with her HEP and notes some increased pain. Pt is ready to begin PT treatment at this time.    Currently in Pain? No/denies    Pain Score 0-No pain                             [facility] Adult PT Treatment/Exercise - 07/30/20 0001      Exercises   Exercises Knee/Hip      Knee/Hip Exercises: Aerobic   Recumbent Bike lvl 3 x 3 min while taking subjective      Knee/Hip Exercises: Standing   Hip Abduction 2 sets;10 reps;Both    Wall Squat 2 sets;10 reps    Wall Squat Limitations with exercise ball    Other Standing Knee Exercises calf stretch 2x30 sec ea      Knee/Hip Exercises: Seated   Long Arc Quad 2 sets;15 reps;Both    Long Arc Quad Weight 2 lbs.    Sit to Sand 2 sets;15  reps;without UE support      Knee/Hip Exercises: Supine   Bridges 2 sets;10 reps    Straight Leg Raises 2 sets;10 reps;Both    Other Supine Knee/Hip Exercises supine clam 2x10 red tband      Knee/Hip Exercises: Sidelying   Clams 2x10 red tband ea      Knee/Hip Exercises: Prone   Hip Extension 2 sets;10 reps;Both                  PT Education - 07/30/20 1440    Education Details HEP    Person(s) Educated Patient    Methods Explanation;Demonstration;Handout            PT Short Term Goals - 07/23/20 1131      PT SHORT TERM GOAL #1   Title Pt will be 80% compliant with HEP    Baseline No HEP    Time 3    Period Weeks    Status New    Target Date 08/13/20             PT Long Term Goals -  07/23/20 1132      PT LONG TERM GOAL #1   Title Pt will demonstrate MMT of >=4/5 for bil knee ext, hip ext, and hip abductotion    Baseline see flowsheet    Time 6    Period Weeks    Status New    Target Date 09/03/20      PT LONG TERM GOAL #2   Title Pt will be able to work for a full day, not limited by pain    Time 6    Period Weeks    Status New    Target Date 09/03/20      PT LONG TERM GOAL #3   Title LEFS Goal next session    Time 6    Period Weeks    Status New    Target Date 09/03/20                 Plan - 07/30/20 1409    Clinical Impression Statement Pt was able to complete all prescribed exercises with no adverse effect, but she continues to show bilateral LE weakness and muscle fatigue. She is progressing as expected thus far and continues to benefit from skilled PT services. PT will continue to progress exercises as tolerated per POC.    Personal Factors and Comorbidities Fitness    Examination-Activity Limitations Lift;Stand;Squat;Stairs    Examination-Participation Restrictions Occupation    Stability/Clinical Decision Making Stable/Uncomplicated    Rehab Potential Good    PT Frequency 2x / week    PT Duration 4 weeks    PT  Treatment/Interventions ADLs/Self Care Home Management;Gait training;Therapeutic activities;Therapeutic exercise;Manual techniques;Neuromuscular re-education;Iontophoresis 4mg /ml Dexamethasone    PT Next Visit Plan work on:  R DF ROM; hip abduction, hip ext, hip abduction strength    PT Home Exercise Plan [email or web address]    Consulted and Agree with Plan of Care Patient           Patient will benefit from skilled therapeutic intervention in order to improve the following deficits and impairments:  Abnormal gait,Decreased activity tolerance,Pain,Decreased strength  Visit Diagnosis: Chronic pain of both knees  Muscle weakness (generalized)     Problem List Patient Active Problem List   Diagnosis Date Noted  . Acute pain of both knees 07/02/2020  . Family history of diabetes mellitus 06/11/2020  . Migraine without aura and without status migrainosus, not intractable 04/23/2020  . Acute bilateral low back pain without sciatica 01/28/2020  . Iron deficiency 12/26/2019  . Low mean corpuscular volume (MCV) 11/12/2019  . Anxiety and depression 10/04/2019  . Hashimoto's thyroiditis 08/30/2019  . Vitamin D deficiency 08/30/2019  . Difficulty breathing 08/30/2019  . Dyspepsia 08/30/2019  . Encounter for counseling regarding contraception 08/30/2019    10/30/2019, PT, DPT 07/30/20 2:42 PM  [facility] [address] [place], [place], [place] Phone: [phone]   Fax:  [phone]  Name: Joan Kim MRN: Brand Males Date of Birth: [birth year]

## 2020-08-01 ENCOUNTER — Other Ambulatory Visit: Payer: Self-pay | Admitting: Family Medicine

## 2020-08-01 DIAGNOSIS — M25562 Pain in left knee: Secondary | ICD-10-CM

## 2020-08-01 DIAGNOSIS — M25561 Pain in right knee: Secondary | ICD-10-CM

## 2020-08-05 ENCOUNTER — Other Ambulatory Visit: Payer: Self-pay

## 2020-08-05 ENCOUNTER — Ambulatory Visit: Payer: Medicaid Other | Admitting: Physical Therapy

## 2020-08-05 ENCOUNTER — Encounter: Payer: Self-pay | Admitting: Physical Therapy

## 2020-08-05 DIAGNOSIS — M6281 Muscle weakness (generalized): Secondary | ICD-10-CM

## 2020-08-05 DIAGNOSIS — M25561 Pain in right knee: Secondary | ICD-10-CM | POA: Diagnosis not present

## 2020-08-05 DIAGNOSIS — G8929 Other chronic pain: Secondary | ICD-10-CM

## 2020-08-05 NOTE — Therapy (Signed)
[facility] [address] [place], [place], [address] Phone: [phone]   Fax:  [phone]  Physical Therapy Treatment  Patient Details  Name: Joan Kim MRN: [id number] Date of Birth: 2000/12/29 Referring Provider (PT): Janit Pagan T   Encounter Date: 08/05/2020   PT End of Session - 08/05/20 1020    Visit Number 3    Number of Visits 12    Date for PT Re-Evaluation 09/03/20    Authorization Type UHC MCD    Authorization Time Period 07/30/2020 - 09/25/2020    Authorization - Visit Number 2    Authorization - Number of Visits 8    PT Start Time 1015   patient arrived late   PT Stop Time 1045    PT Time Calculation (min) 30 min    Activity Tolerance Patient tolerated treatment well    Behavior During Therapy [facility] for tasks assessed/performed           Past Medical History:  Diagnosis Date  . Asthma   . Bacterial vaginitis   . GERD (gastroesophageal reflux disease)   . H/O bladder infections   . Helicobacter pylori antibody positive 03/2019    Past Surgical History:  Procedure Laterality Date  . NO PAST SURGERIES      There were no vitals filed for this visit.   Subjective Assessment - 08/05/20 1019    Subjective Patient reports knees are feeling good. Denies pain. Patient reports exercises are going well, she is having a hard time remembering them.    Patient Stated Goals Reduce knee pain, stand for 5 hours standing and lifting    Currently in Pain? No/denies              [facility] PT Assessment - 08/05/20 0001      Functional Tests   Functional tests Single leg stance      Single Leg Stance   Comments Bilaterally approximately 12 seconds, trendelenburg noted and increased sway      Strength   Right Hip Extension 4-/5    Left Hip Extension 4-/5      Flexibility   Hamstrings WNL    Quadriceps WNL                         [facility] Adult PT Treatment/Exercise - 08/05/20 0001       Exercises   Exercises Knee/Hip      Knee/Hip Exercises: Stretches   Gastroc Stretch 2 reps;30 seconds    Gastroc Stretch Limitations standing at wall      Knee/Hip Exercises: Standing   Wall Squat 2 sets;10 reps;5 seconds    Wall Squat Limitations cued to reduce depth if starts to cause knee pain    SLS 2 x 30 sec each      Knee/Hip Exercises: Supine   Bridges with Clamshell 2 sets;10 reps   green band   Straight Leg Raises 2 sets;10 reps    Straight Leg Raises Limitations focus on quad set and maintaining knee extension      Knee/Hip Exercises: Sidelying   Clams 2 x 15 with green                  PT Education - 08/05/20 1020    Education Details HEP update    Person(s) Educated Patient    Methods Explanation;Demonstration;Verbal cues;Handout    Comprehension Verbalized understanding;Returned demonstration;Verbal cues required;Need further instruction  PT Short Term Goals - 08/05/20 1027      PT SHORT TERM GOAL #1   Title Pt will be 80% compliant with HEP    Baseline updated HEP this visit    Time 3    Period Weeks    Status On-going    Target Date 08/13/20             PT Long Term Goals - 07/23/20 1132      PT LONG TERM GOAL #1   Title Pt will demonstrate MMT of >=4/5 for bil knee ext, hip ext, and hip abductotion    Baseline see flowsheet    Time 6    Period Weeks    Status New    Target Date 09/03/20      PT LONG TERM GOAL #2   Title Pt will be able to work for a full day, not limited by pain    Time 6    Period Weeks    Status New    Target Date 09/03/20      PT LONG TERM GOAL #3   Title LEFS Goal next session    Time 6    Period Weeks    Status New    Target Date 09/03/20                 Plan - 08/05/20 1022    Clinical Impression Statement Patient tolerated therapy well with no adverse effects. Therapy focused on progressing hip and quad strength this visit with good tolerance. Patient did report slight increase  in knee discomfort with wall squats so instructed to reduce depth. She did require cueing for proper squat form to reduce knee valgus. Patient also exhibits balance deficit bilaterally so inlcuded SLS in exercises. Update HEP this visit. Patient would benefit from continued skilled PT to progress strength and control in order to reduce pain and maximize functional ability.    PT Treatment/Interventions ADLs/Self Care Home Management;Gait training;Therapeutic activities;Therapeutic exercise;Manual techniques;Neuromuscular re-education;Iontophoresis 4mg /ml Dexamethasone    PT Next Visit Plan Review HEP and progress PRN, continue hip and quad strengthening    PT Home Exercise Plan [email or web address]    Consulted and Agree with Plan of Care Patient           Patient will benefit from skilled therapeutic intervention in order to improve the following deficits and impairments:  Abnormal gait,Decreased activity tolerance,Pain,Decreased strength  Visit Diagnosis: Chronic pain of both knees  Muscle weakness (generalized)     Problem List Patient Active Problem List   Diagnosis Date Noted  . Acute pain of both knees 07/02/2020  . Family history of diabetes mellitus 06/11/2020  . Migraine without aura and without status migrainosus, not intractable 04/23/2020  . Acute bilateral low back pain without sciatica 01/28/2020  . Iron deficiency 12/26/2019  . Low mean corpuscular volume (MCV) 11/12/2019  . Anxiety and depression 10/04/2019  . Hashimoto's thyroiditis 08/30/2019  . Vitamin D deficiency 08/30/2019  . Difficulty breathing 08/30/2019  . Dyspepsia 08/30/2019  . Encounter for counseling regarding contraception 08/30/2019    10/30/2019, PT, DPT, LAT, ATC 08/05/20  11:08 AM Phone: [phone] Fax: [phone]   [facility] [address] [place], [place], [place] Phone: [phone]   Fax:  [phone]  Name: Joan Kim MRN:  Brand Males Date of Birth: [birth year]

## 2020-08-05 NOTE — Patient Instructions (Signed)
Access Code: [email or web address] URL: [email or web address] Date: 08/05/2020 Prepared by: Rosana Hoes  Exercises Active Straight Leg Raise with Quad Set - 1 x daily - 5 x weekly - 2 sets - 10 reps Bridge with Hip Abduction and Resistance - Ground Touches - 1 x daily - 5 x weekly - 2 sets - 10 reps Clamshell with Resistance - 1 x daily - 5 x weekly - 2 sets - 15 reps Wall Squat - 1 x daily - 5 x weekly - 2 sets - 10 reps - 5 hold Gastroc Stretch on Wall - 1 x daily - 7 x weekly - 3 reps - 30 hold Single Leg Stance - 1 x daily - 7 x weekly - 3 reps - 30-60 hold

## 2020-08-07 ENCOUNTER — Ambulatory Visit: Payer: Medicaid Other

## 2020-08-12 ENCOUNTER — Other Ambulatory Visit: Payer: Self-pay

## 2020-08-12 ENCOUNTER — Ambulatory Visit (INDEPENDENT_AMBULATORY_CARE_PROVIDER_SITE_OTHER): Payer: Medicaid Other | Admitting: Family Medicine

## 2020-08-12 ENCOUNTER — Encounter: Payer: Self-pay | Admitting: Family Medicine

## 2020-08-12 ENCOUNTER — Ambulatory Visit: Payer: Medicaid Other

## 2020-08-12 VITALS — BP 100/72 | HR 82 | Ht 65.0 in | Wt 169.2 lb

## 2020-08-12 DIAGNOSIS — M25562 Pain in left knee: Secondary | ICD-10-CM

## 2020-08-12 DIAGNOSIS — M6281 Muscle weakness (generalized): Secondary | ICD-10-CM

## 2020-08-12 DIAGNOSIS — R5383 Other fatigue: Secondary | ICD-10-CM

## 2020-08-12 DIAGNOSIS — M25561 Pain in right knee: Secondary | ICD-10-CM

## 2020-08-12 DIAGNOSIS — F419 Anxiety disorder, unspecified: Secondary | ICD-10-CM | POA: Diagnosis not present

## 2020-08-12 DIAGNOSIS — F32A Depression, unspecified: Secondary | ICD-10-CM

## 2020-08-12 DIAGNOSIS — E611 Iron deficiency: Secondary | ICD-10-CM | POA: Diagnosis not present

## 2020-08-12 DIAGNOSIS — G8929 Other chronic pain: Secondary | ICD-10-CM

## 2020-08-12 HISTORY — DX: Other fatigue: R53.83

## 2020-08-12 NOTE — Therapy (Signed)
[facility] Outpatient Rehabilitation [facility] [address] [place], [place], [address] Phone: [phone]   Fax:  [phone]  Physical Therapy Treatment  Patient Details  Name: Joan Kim MRN: [id number] Date of Birth: 22-Sep-2000 Referring Provider (PT): Janit Pagan T   Encounter Date: 08/12/2020   PT End of Session - 08/12/20 1002    Visit Number 4    Number of Visits 12    Date for PT Re-Evaluation 09/03/20    Authorization Type UHC MCD    Authorization Time Period 07/30/2020 - 09/25/2020    Authorization - Visit Number 2    Authorization - Number of Visits 8    PT Start Time 1000    PT Stop Time 1038    PT Time Calculation (min) 38 min    Activity Tolerance Patient tolerated treatment well    Behavior During Therapy [facility] for tasks assessed/performed           Past Medical History:  Diagnosis Date  . Asthma   . Bacterial vaginitis   . GERD (gastroesophageal reflux disease)   . H/O bladder infections   . Helicobacter pylori antibody positive 03/2019    Past Surgical History:  Procedure Laterality Date  . NO PAST SURGERIES      There were no vitals filed for this visit.   Subjective Assessment - 08/12/20 1003    Subjective Pt presents to PT with no current reports of pain. Pt has been compliant with her HEP with no adverse effect. She does promote muscle soreness after last sessoin. Pt is ready to begin PT treatment at this time.    Currently in Pain? No/denies    Pain Score 0-No pain                             [facility] Adult PT Treatment/Exercise - 08/12/20 0001      Knee/Hip Exercises: Stretches   Gastroc Stretch 2 reps;30 seconds    Gastroc Stretch Limitations at counter      Knee/Hip Exercises: Aerobic   Recumbent Bike lvl 4 x 3 min while taking subective      Knee/Hip Exercises: Machines for Strengthening   Cybex Leg Press 2x10 65lb      Knee/Hip Exercises: Standing   Hip Abduction 2 sets;10 reps;Both     Abduction Limitations green tband    Hip Extension 2 sets;10 reps;Both    Extension Limitations green tband    Wall Squat 2 sets;10 reps    Other Standing Knee Exercises lateral walk x 4 laps at counter      Knee/Hip Exercises: Supine   Bridges with Clamshell 3 sets;10 reps   blue tband   Straight Leg Raises 2 sets;15 reps;Both    Straight Leg Raises Limitations focus on quad set and maintaining knee extension      Knee/Hip Exercises: Sidelying   Clams 2x10 blue tband                    PT Short Term Goals - 08/12/20 1036      PT SHORT TERM GOAL #1   Title Pt will be 80% compliant with HEP    Baseline updated HEP this visit    Time 3    Period Weeks    Status Achieved    Target Date 08/13/20             PT Long Term Goals - 07/23/20 1132  PT LONG TERM GOAL #1   Title Pt will demonstrate MMT of >=4/5 for bil knee ext, hip ext, and hip abductotion    Baseline see flowsheet    Time 6    Period Weeks    Status New    Target Date 09/03/20      PT LONG TERM GOAL #2   Title Pt will be able to work for a full day, not limited by pain    Time 6    Period Weeks    Status New    Target Date 09/03/20      PT LONG TERM GOAL #3   Title LEFS Goal next session    Time 6    Period Weeks    Status New    Target Date 09/03/20                 Plan - 08/12/20 1032    Clinical Impression Statement Pt was able to complete prescribed exercises with no adverse effect. Today again focused on increasing proximal hip and quad strength. HEP updated to include continued standing strengthening exercises. She is progressing well with therapy overall and responding well to general LE strengthening. PT will assess response to new HEP and readiness for discharge at next.    PT Treatment/Interventions ADLs/Self Care Home Management;Gait training;Therapeutic activities;Therapeutic exercise;Manual techniques;Neuromuscular re-education;Iontophoresis 4mg /ml Dexamethasone     PT Next Visit Plan assess response to new HEP and readiness for discharge at next    PT Home Exercise Plan [facility]    Consulted and Agree with Plan of Care Patient           Patient will benefit from skilled therapeutic intervention in order to improve the following deficits and impairments:  Abnormal gait,Decreased activity tolerance,Pain,Decreased strength  Visit Diagnosis: Chronic pain of both knees  Muscle weakness (generalized)     Problem List Patient Active Problem List   Diagnosis Date Noted  . Acute pain of both knees 07/02/2020  . Family history of diabetes mellitus 06/11/2020  . Migraine without aura and without status migrainosus, not intractable 04/23/2020  . Acute bilateral low back pain without sciatica 01/28/2020  . Iron deficiency 12/26/2019  . Low mean corpuscular volume (MCV) 11/12/2019  . Anxiety and depression 10/04/2019  . Hashimoto's thyroiditis 08/30/2019  . Vitamin D deficiency 08/30/2019  . Difficulty breathing 08/30/2019  . Dyspepsia 08/30/2019  . Encounter for counseling regarding contraception 08/30/2019    10/30/2019, PT, DPT 08/12/20 10:38 AM  [facility] [address] [place], [place], [place] Phone: [phone]   Fax:  [phone]  Name: Joan Kim MRN: Brand Males Date of Birth: 01/24/2001

## 2020-08-12 NOTE — Progress Notes (Signed)
SUBJECTIVE:   CHIEF COMPLAINT / HPI:   Knee Pain Improving  Going to PT Takes meloxicam daily, advised can decrease  Anxiety and depression Insomnia Hasn't been sleeping Goes to bed at 3am and wakes up at [employer] like she wakes up still tired She doesn't think she snores, but isn't sure Sometimes wakes up with a morning headache, sometimes gets better throughout the day Throat hurts when she wakes up as well Gets in bed by 9:30pm, when her boyfriend goes to bed She will lay in bed, but states that it doesn't work The fan will be on, keeps the TV on and can't sleep  She has been exercising with PT and going to the pool Doesn't feel like she is staying up worrying about things Feels sluggish even if she goes to bed early Feels irritable with not sleeping, but anxiety has been okay Takes melatonin, but it doesn't help Taking buspar as needed, but not at night time, still taking zoloft in the morning around 9AM She sees her endocrinologist in July She has a history of low ferritin, still tolerating prenatal vitamins with iron  GAD 7 : Generalized Anxiety Score 08/12/2020 06/11/2020 04/23/2020 02/18/2020  Nervous, Anxious, on Edge 1 1 1 1   Control/stop worrying 1 2 1 1   Worry too much - different things 2 2 1 1   Trouble relaxing 2 1 2 2   Restless 0 0 0 0  Easily annoyed or irritable 1 2 1 1   Afraid - awful might happen 2 2 2 1   Total GAD 7 Score 9 10 8 7   Anxiety Difficulty - - Very difficult -    Flowsheet Row Office Visit from 08/12/2020 in [facility]  PHQ-9 Total Score 9      PERTINENT  PMH / PSH: Hx migraines, Hashimoto's thyroiditis, vitamin D deficiency, anxiety and depression, iron deficiency  OBJECTIVE:   BP 100/72   Pulse 82   Ht 5\' 5"  (1.651 m)   Wt 169 lb 3.2 oz (76.7 kg)   LMP 07/22/2020   SpO2 97%   BMI 28.16 kg/m    Physical Exam:  General: 20 y.o. female in NAD HEENT: NCAT Neck: Supple, no thyromegaly Cardio: RRR no  m/r/g Lungs: CTAB, no wheezing, no rhonchi, no crackles, no IWOB on RA Skin: warm and dry Extremities: No edema   ASSESSMENT/PLAN:   Fatigue Unclear cause.  Could consider nonrestful sleep at night.  Does not report good sleep hygiene.  Discussed stopping TV use and instead using a night light or sound machine.  She is exercising, which she was congratulated on.  Does not seem to have a change in her diet.  Could consider sleep apnea, although this is less likely.  She does have anxiety and depression, which is decently controlled, but continues to have symptoms.  We will try to switch her Zoloft from taking in the morning to taking at night.  Advised to take at 3 AM tonight, 12 AM the next night, 9 PM the next night.  Given that this has some sedating qualities, this may be helpful.  Also encouraged BuSpar use at night.  Could trial hydroxyzine in the future at nighttime as well.  Will obtain BMP, CBC, ferritin, TSH given her history of iron deficiency and Hashimoto's thyroiditis.  Follow-up in 1 month.  Could consider a sleep study in the future if continues to have signs of fatigue.  Iron deficiency Hemoglobins have been within normal limits, had difficulty  tolerating iron supplementation, now on prenatal vitamin with iron.  Obtain ferritin and CBC today.  Anxiety and depression Stable.  No SI.  Continue on Zoloft and BuSpar.  Making changes in timing per above.  Acute pain of both knees Improving with physical therapy.  Advised that she no longer take needs to take Mobic daily.  Her last physical therapy appointment will be next week, advised her that she will still need to continue with her exercises from therapy to decrease pain and the risk of returning.     Unknown Jim, DO [facility]

## 2020-08-12 NOTE — Patient Instructions (Signed)
Thank you for coming to see me today. It was a pleasure. Today we talked about:   We will get some labs today.  If they are abnormal or we need to do something about them, I will call you.  If they are normal, I will send you a message on MyChart (if it is active) or a letter in the mail.  If you don't hear from [place] in 2 weeks, please call the office at the number below.  Instead of the TV, try a sound machine (or app on your phone) and a night light.  Also try to move your Zoloft to nighttime and take your buspar before bed.  If things still aren't getting better, we can consider a sleep study.  Keep up with your exercises.    Please follow-up with me in 1 month.  If you have any questions or concerns, please do not hesitate to call the office at [phone].  Best,   Luis Abed, DO

## 2020-08-12 NOTE — Assessment & Plan Note (Signed)
Stable.  No SI.  Continue on Zoloft and BuSpar.  Making changes in timing per above.

## 2020-08-12 NOTE — Assessment & Plan Note (Signed)
Unclear cause.  Could consider nonrestful sleep at night.  Does not report good sleep hygiene.  Discussed stopping TV use and instead using a night light or sound machine.  She is exercising, which she was congratulated on.  Does not seem to have a change in her diet.  Could consider sleep apnea, although this is less likely.  She does have anxiety and depression, which is decently controlled, but continues to have symptoms.  We will try to switch her Zoloft from taking in the morning to taking at night.  Advised to take at 3 AM tonight, 12 AM the next night, 9 PM the next night.  Given that this has some sedating qualities, this may be helpful.  Also encouraged BuSpar use at night.  Could trial hydroxyzine in the future at nighttime as well.  Will obtain BMP, CBC, ferritin, TSH given her history of iron deficiency and Hashimoto's thyroiditis.  Follow-up in 1 month.  Could consider a sleep study in the future if continues to have signs of fatigue.

## 2020-08-12 NOTE — Assessment & Plan Note (Signed)
Improving with physical therapy.  Advised that she no longer take needs to take Mobic daily.  Her last physical therapy appointment will be next week, advised her that she will still need to continue with her exercises from therapy to decrease pain and the risk of returning.

## 2020-08-12 NOTE — Assessment & Plan Note (Signed)
Hemoglobins have been within normal limits, had difficulty tolerating iron supplementation, now on prenatal vitamin with iron.  Obtain ferritin and CBC today.

## 2020-08-13 LAB — BASIC METABOLIC PANEL
BUN/Creatinine Ratio: 12 (ref 9–23)
BUN: 9 mg/dL (ref 6–20)
CO2: 24 mmol/L (ref 20–29)
Calcium: 9.1 mg/dL (ref 8.7–10.2)
Chloride: 99 mmol/L (ref 96–106)
Creatinine, Ser: 0.75 mg/dL (ref 0.57–1.00)
Glucose: 70 mg/dL (ref 65–99)
Potassium: 4.1 mmol/L (ref 3.5–5.2)
Sodium: 139 mmol/L (ref 134–144)
eGFR: 117 mL/min/{1.73_m2} (ref >59)

## 2020-08-13 LAB — CBC WITH DIFFERENTIAL/PLATELET
Basophils Absolute: 0 10*3/uL (ref 0.0–0.2)
Basos: 0 %
EOS (ABSOLUTE): 0.1 10*3/uL (ref 0.0–0.4)
Eos: 1 %
Hematocrit: 39.5 % (ref 34.0–46.6)
Hemoglobin: 13.1 g/dL (ref 11.1–15.9)
Immature Grans (Abs): 0 10*3/uL (ref 0.0–0.1)
Immature Granulocytes: 0 %
Lymphocytes Absolute: 2 10*3/uL (ref 0.7–3.1)
Lymphs: 29 %
MCH: 27.6 pg (ref 26.6–33.0)
MCHC: 33.2 g/dL (ref 31.5–35.7)
MCV: 83 fL (ref 79–97)
Monocytes Absolute: 0.5 10*3/uL (ref 0.1–0.9)
Monocytes: 8 %
Neutrophils Absolute: 4.4 10*3/uL (ref 1.4–7.0)
Neutrophils: 62 %
Platelets: 315 10*3/uL (ref 150–450)
RBC: 4.74 x10E6/uL (ref 3.77–5.28)
RDW: 12.2 % (ref 11.7–15.4)
WBC: 7.1 10*3/uL (ref 3.4–10.8)

## 2020-08-13 LAB — TSH: TSH: 0.832 u[IU]/mL (ref 0.450–4.500)

## 2020-08-13 LAB — FERRITIN: Ferritin: 21 ng/mL (ref 15–150)

## 2020-08-17 ENCOUNTER — Other Ambulatory Visit: Payer: Self-pay | Admitting: Family Medicine

## 2020-08-17 DIAGNOSIS — F419 Anxiety disorder, unspecified: Secondary | ICD-10-CM

## 2020-08-19 ENCOUNTER — Other Ambulatory Visit: Payer: Self-pay

## 2020-08-19 ENCOUNTER — Ambulatory Visit: Payer: Medicaid Other

## 2020-08-19 DIAGNOSIS — M25562 Pain in left knee: Secondary | ICD-10-CM

## 2020-08-19 DIAGNOSIS — G8929 Other chronic pain: Secondary | ICD-10-CM

## 2020-08-19 DIAGNOSIS — M6281 Muscle weakness (generalized): Secondary | ICD-10-CM

## 2020-08-19 DIAGNOSIS — M25561 Pain in right knee: Secondary | ICD-10-CM | POA: Diagnosis not present

## 2020-08-19 NOTE — Therapy (Signed)
[facility] Outpatient Rehabilitation [facility] [address] [place], [place], [address] Phone: [phone]   Fax:  [phone]  Physical Therapy Treatment  Patient Details  Name: Joan Kim MRN: [id number] Date of Birth: 2001-02-06 Referring Provider (PT): Janit Pagan T   Encounter Date: 08/19/2020   PT End of Session - 08/19/20 1000    Visit Number 5    Number of Visits 12    Date for PT Re-Evaluation 09/03/20    Authorization Type UHC MCD    Authorization Time Period 07/30/2020 - 09/25/2020    Authorization - Visit Number 5    Authorization - Number of Visits 8    PT Start Time 1000    PT Stop Time 1039    PT Time Calculation (min) 39 min    Activity Tolerance Patient tolerated treatment well    Behavior During Therapy [facility] for tasks assessed/performed           Past Medical History:  Diagnosis Date  . Asthma   . Bacterial vaginitis   . GERD (gastroesophageal reflux disease)   . H/O bladder infections   . Helicobacter pylori antibody positive 03/2019    Past Surgical History:  Procedure Laterality Date  . NO PAST SURGERIES      There were no vitals filed for this visit.   Subjective Assessment - 08/19/20 1000    Subjective Pt presents to PT with reports of no current pain. Pt has been compliant with her HEP with no adverse effect. She is ready to begin PT treatment at this time.    Currently in Pain? No/denies    Pain Score 0-No pain                             [facility] Adult PT Treatment/Exercise - 08/19/20 0001      Knee/Hip Exercises: Stretches   ITB Stretch 2 reps;30 seconds;Both      Knee/Hip Exercises: Aerobic   Recumbent Bike lvl 4 x 3 min while taking subective      Knee/Hip Exercises: Machines for Strengthening   Cybex Knee Flexion 2x10 35lbs      Knee/Hip Exercises: Standing   Hip Abduction 2 sets;10 reps;Both    Abduction Limitations green tband    Hip Extension 2 sets;10 reps;Both    Extension  Limitations green tband    Step Down 3 sets;5 sets    Step Down Limitations eccentric heel tap    Functional Squat 2 sets;10 reps   holding freemotion handle   Wall Squat 2 sets;10 reps    Other Standing Knee Exercises lateral walk x 4 laps at counter      Knee/Hip Exercises: Supine   Bridges with Clamshell 2 sets;10 reps   green tband   Straight Leg Raises 15 reps;Both                  PT Education - 08/19/20 1036    Education Details HEP    Person(s) Educated Patient    Methods Explanation;Demonstration;Handout    Comprehension Verbalized understanding;Returned demonstration            PT Short Term Goals - 08/12/20 1036      PT SHORT TERM GOAL #1   Title Pt will be 80% compliant with HEP    Baseline updated HEP this visit    Time 3    Period Weeks    Status Achieved    Target Date 08/13/20  PT Long Term Goals - 07/23/20 1132      PT LONG TERM GOAL #1   Title Pt will demonstrate MMT of >=4/5 for bil knee ext, hip ext, and hip abductotion    Baseline see flowsheet    Time 6    Period Weeks    Status New    Target Date 09/03/20      PT LONG TERM GOAL #2   Title Pt will be able to work for a full day, not limited by pain    Time 6    Period Weeks    Status New    Target Date 09/03/20      PT LONG TERM GOAL #3   Title LEFS Goal next session    Time 6    Period Weeks    Status New    Target Date 09/03/20                 Plan - 08/19/20 1010    Clinical Impression Statement Pt was once again able to complete all prescribed exercises with no adverse effect or increase in pain, with exception of wall squat exercise. ITB stretch was added to HEP d/t subjective compliant of R ITB pain and TTP to R distal ITB. PT will continue to progress exercises as tolerated per POC as prescribed.    PT Treatment/Interventions ADLs/Self Care Home Management;Gait training;Therapeutic activities;Therapeutic exercise;Manual techniques;Neuromuscular  re-education;Iontophoresis 4mg /ml Dexamethasone    PT Next Visit Plan assess response to new HEP; progress as able    PT Home Exercise Plan [id number]    Consulted and Agree with Plan of Care Patient           Patient will benefit from skilled therapeutic intervention in order to improve the following deficits and impairments:  Abnormal gait,Decreased activity tolerance,Pain,Decreased strength  Visit Diagnosis: Chronic pain of both knees  Muscle weakness (generalized)     Problem List Patient Active Problem List   Diagnosis Date Noted  . Fatigue 08/12/2020  . Acute pain of both knees 07/02/2020  . Family history of diabetes mellitus 06/11/2020  . Migraine without aura and without status migrainosus, not intractable 04/23/2020  . Acute bilateral low back pain without sciatica 01/28/2020  . Iron deficiency 12/26/2019  . Low mean corpuscular volume (MCV) 11/12/2019  . Anxiety and depression 10/04/2019  . Hashimoto's thyroiditis 08/30/2019  . Vitamin D deficiency 08/30/2019  . Difficulty breathing 08/30/2019  . Dyspepsia 08/30/2019  . Encounter for counseling regarding contraception 08/30/2019    10/30/2019, PT, DPT 08/19/20 10:39 AM  [facility] [address] [place], [place], [place] Phone: [phone]   Fax:  [phone]  Name: Joan Kim MRN: Brand Males Date of Birth: 06/15/2000

## 2020-08-21 ENCOUNTER — Ambulatory Visit: Payer: Medicaid Other

## 2020-08-21 ENCOUNTER — Other Ambulatory Visit: Payer: Self-pay

## 2020-08-21 DIAGNOSIS — M25561 Pain in right knee: Secondary | ICD-10-CM | POA: Diagnosis not present

## 2020-08-21 DIAGNOSIS — G8929 Other chronic pain: Secondary | ICD-10-CM

## 2020-08-21 DIAGNOSIS — M6281 Muscle weakness (generalized): Secondary | ICD-10-CM

## 2020-08-21 DIAGNOSIS — M25562 Pain in left knee: Secondary | ICD-10-CM

## 2020-08-21 NOTE — Therapy (Signed)
[facility] Outpatient Rehabilitation [facility] [address] [place], [place], [address] Phone: [phone]   Fax:  [phone]  Physical Therapy Treatment  Patient Details  Name: Joan Kim MRN: [id number] Date of Birth: Aug 03, 2000 Referring Provider (PT): Janit Pagan T   Encounter Date: 08/21/2020   PT End of Session - 08/21/20 1057    Visit Number 6    Number of Visits 12    Date for PT Re-Evaluation 09/03/20    Authorization Type UHC MCD    Authorization Time Period 07/30/2020 - 09/25/2020    Authorization - Visit Number 6    Authorization - Number of Visits 8    PT Start Time 1055    PT Stop Time 1130    PT Time Calculation (min) 35 min    Activity Tolerance Patient tolerated treatment well    Behavior During Therapy [facility] for tasks assessed/performed           Past Medical History:  Diagnosis Date  . Asthma   . Bacterial vaginitis   . GERD (gastroesophageal reflux disease)   . H/O bladder infections   . Helicobacter pylori antibody positive 03/2019    Past Surgical History:  Procedure Laterality Date  . NO PAST SURGERIES      There were no vitals filed for this visit.   Subjective Assessment - 08/21/20 1055    Subjective Pt presents to PT with reports of no current pain in either knee. She has continued to be compliant with her HEP with adverse effect. Pt is ready to begin PT treatment at this time.    Currently in Pain? No/denies    Pain Score 0-No pain                             [facility] Adult PT Treatment/Exercise - 08/21/20 0001      Knee/Hip Exercises: Aerobic   Recumbent Bike lvl 4 x 2 min while taking subective      Knee/Hip Exercises: Machines for Strengthening   Cybex Knee Flexion 2x10 45lbs    Cybex Leg Press 2x10 65lb   slight L knee pain   Other Machine retro treadmill walk x 60 sec      Knee/Hip Exercises: Standing   Hip Abduction 2 sets;10 reps;Both    Abduction Limitations blue tband    Hip  Extension 2 sets;10 reps;Both    Extension Limitations blue tband    Step Down 3 sets;5 sets;Both;Step Height: 4"    Step Down Limitations eccentric heel tap    Other Standing Knee Exercises lateral walk x 2 laps 64ft blue tband    Other Standing Knee Exercises monster walk x 2 laps 22ft blue tband      Knee/Hip Exercises: Supine   Bridges with Clamshell 3 sets;10 reps   blue tband   Straight Leg Raises 15 reps;Both      Manual Therapy   Manual therapy comments tennis ball massage to R distal ITB                    PT Short Term Goals - 08/12/20 1036      PT SHORT TERM GOAL #1   Title Pt will be 80% compliant with HEP    Baseline updated HEP this visit    Time 3    Period Weeks    Status Achieved    Target Date 08/13/20  PT Long Term Goals - 07/23/20 1132      PT LONG TERM GOAL #1   Title Pt will demonstrate MMT of >=4/5 for bil knee ext, hip ext, and hip abductotion    Baseline see flowsheet    Time 6    Period Weeks    Status New    Target Date 09/03/20      PT LONG TERM GOAL #2   Title Pt will be able to work for a full day, not limited by pain    Time 6    Period Weeks    Status New    Target Date 09/03/20      PT LONG TERM GOAL #3   Title LEFS Goal next session    Time 6    Period Weeks    Status New    Target Date 09/03/20                 Plan - 08/21/20 1114    Clinical Impression Statement Pt was once again able to complete prescribed exercises with no adverse effect. She continues to show improving LE strength and functional activity tolerance, but does fatigue with more difficult exercises. She continues to progress well with therapy, although recent increase in R ITB discomfort decrease some tolerance to activity. PT scheduled additional visit for two weeks and will assess response after HEP compliance and readiness for discharge.    PT Treatment/Interventions ADLs/Self Care Home Management;Gait training;Therapeutic  activities;Therapeutic exercise;Manual techniques;Neuromuscular re-education;Iontophoresis 4mg /ml Dexamethasone    PT Next Visit Plan assess response to HEP and readiness for discharge    PT Home Exercise Plan [id number]    Consulted and Agree with Plan of Care Patient           Patient will benefit from skilled therapeutic intervention in order to improve the following deficits and impairments:  Abnormal gait,Decreased activity tolerance,Pain,Decreased strength  Visit Diagnosis: Chronic pain of both knees  Muscle weakness (generalized)     Problem List Patient Active Problem List   Diagnosis Date Noted  . Fatigue 08/12/2020  . Acute pain of both knees 07/02/2020  . Family history of diabetes mellitus 06/11/2020  . Migraine without aura and without status migrainosus, not intractable 04/23/2020  . Acute bilateral low back pain without sciatica 01/28/2020  . Iron deficiency 12/26/2019  . Low mean corpuscular volume (MCV) 11/12/2019  . Anxiety and depression 10/04/2019  . Hashimoto's thyroiditis 08/30/2019  . Vitamin D deficiency 08/30/2019  . Difficulty breathing 08/30/2019  . Dyspepsia 08/30/2019  . Encounter for counseling regarding contraception 08/30/2019    10/30/2019, PT, DPT 08/21/20 12:24 PM  [facility] Outpatient Rehabilitation [facility] [address] [place], [place], [place] Phone: [phone]   Fax:  [phone]  Name: JENSYN SHAVE MRN: Brand Males Date of Birth: 11/06/2000

## 2020-08-29 ENCOUNTER — Other Ambulatory Visit: Payer: Self-pay | Admitting: Family Medicine

## 2020-08-29 DIAGNOSIS — M25561 Pain in right knee: Secondary | ICD-10-CM

## 2020-09-03 ENCOUNTER — Ambulatory Visit: Payer: Medicaid Other | Attending: Family Medicine

## 2020-09-03 ENCOUNTER — Other Ambulatory Visit: Payer: Self-pay

## 2020-09-03 DIAGNOSIS — M25561 Pain in right knee: Secondary | ICD-10-CM | POA: Diagnosis present

## 2020-09-03 DIAGNOSIS — G8929 Other chronic pain: Secondary | ICD-10-CM | POA: Insufficient documentation

## 2020-09-03 DIAGNOSIS — M6281 Muscle weakness (generalized): Secondary | ICD-10-CM | POA: Diagnosis present

## 2020-09-03 DIAGNOSIS — M25562 Pain in left knee: Secondary | ICD-10-CM | POA: Diagnosis present

## 2020-09-03 NOTE — Therapy (Addendum)
[place], [place], [address] Phone: [phone]   Fax:  [phone]  Physical Therapy Treatment/Discharge  Patient Details  Name: Joan Kim MRN: [id number] Date of Birth: 03-11-2001 Referring Provider (PT): Andrena Mews T   Encounter Date: 09/03/2020   PT End of Session - 09/03/20 1833     Visit Number 7    Number of Visits 12    Date for PT Re-Evaluation 09/03/20    Authorization Type [employer] MCD    Authorization Time Period 07/30/2020 - 09/25/2020    Authorization - Visit Number 6    Authorization - Number of Visits 8    PT Start Time 5056    PT Stop Time 1908    PT Time Calculation (min) 38 min    Activity Tolerance Patient tolerated treatment well    Behavior During Therapy [facility] for tasks assessed/performed             Past Medical History:  Diagnosis Date   Asthma    Bacterial vaginitis    GERD (gastroesophageal reflux disease)    H/O bladder infections    Helicobacter pylori antibody positive 03/2019    Past Surgical History:  Procedure Laterality Date   NO PAST SURGERIES      There were no vitals filed for this visit.   Subjective Assessment - 09/03/20 1835     Subjective Pt presents to PT with no reports of current pain. She has been compliant with HEP with no adverse effect. Pt is ready to begin PT treatment at this time.    Currently in Pain? No/denies    Pain Score 0-No pain                [facility] PT Assessment - 09/03/20 0001       Strength   Right Hip Extension 5/5    Right Hip ABduction 5/5    Left Hip Extension 5/5    Left Hip ABduction 5/5    Right Knee Extension 5/5    Left Knee Extension 5/5                           [facility] Adult PT Treatment/Exercise - 09/03/20 0001       Knee/Hip Exercises: Stretches   Gastroc Stretch 30 seconds;Both      Knee/Hip Exercises: Standing   Hip Abduction 2 sets;10 reps;Both    Abduction  Limitations blue tband    Hip Extension 2 sets;10 reps;Both    Extension Limitations blue tband    Wall Squat 10 reps    SLS x 30 sec ea    Other Standing Knee Exercises lateral walk blue tband x 4 laps at counter    Other Standing Knee Exercises monster walk x 2 laps 53ft blue tband      Knee/Hip Exercises: Supine   Bridges with Clamshell 2 sets;10 reps   blue tband   Straight Leg Raises 15 reps;Both      Knee/Hip Exercises: Sidelying   Hip ABduction 15 reps;Both    Clams 2x15 blue tband                      PT Short Term Goals - 08/12/20 1036       PT SHORT TERM GOAL #1   Title Pt will be 80% compliant with HEP    Baseline updated HEP this visit    Time 3  Period Weeks    Status Achieved    Target Date 08/13/20               PT Long Term Goals - 09/03/20 1907       PT LONG TERM GOAL #1   Title Pt will demonstrate MMT of >=4/5 for bil knee ext, hip ext, and hip abductotion    Baseline see flowsheet    Time 6    Period Weeks    Status Achieved      PT LONG TERM GOAL #2   Title Pt will be able to work for a full day, not limited by pain    Time 6    Period Weeks    Status Achieved      PT LONG TERM GOAL #3   Title LEFS Goal next session    Time 6    Period Weeks    Status New                   Plan - 09/03/20 1908     Clinical Impression Statement Pt was able to complete prescribed exercises and demonstrated knowledge of HEP with no adverse effect. Over the course of PT treatment she was able to greatly increase bilateral LE strength to 5/5 for all tested motions. She also decresaed knee pain to 0/10 over last several weeks and noted improved tolerance during work. She should continue to improve with HEP compliance and is being discharged at this time with that in place.    PT Treatment/Interventions ADLs/Self Care Home Management;Gait training;Therapeutic activities;Therapeutic exercise;Manual techniques;Neuromuscular  re-education;Iontophoresis 4mg /ml Dexamethasone    PT Home Exercise Plan [facility]             Patient will benefit from skilled therapeutic intervention in order to improve the following deficits and impairments:  Abnormal gait,Decreased activity tolerance,Pain,Decreased strength  Visit Diagnosis: Chronic pain of both knees  Muscle weakness (generalized)     Problem List Patient Active Problem List   Diagnosis Date Noted   Fatigue 08/12/2020   Acute pain of both knees 07/02/2020   Family history of diabetes mellitus 06/11/2020   Migraine without aura and without status migrainosus, not intractable 04/23/2020   Acute bilateral low back pain without sciatica 01/28/2020   Iron deficiency 12/26/2019   Low mean corpuscular volume (MCV) 11/12/2019   Anxiety and depression 10/04/2019   Hashimoto's thyroiditis 08/30/2019   Vitamin D deficiency 08/30/2019   Difficulty breathing 08/30/2019   Dyspepsia 08/30/2019   Encounter for counseling regarding contraception 08/30/2019    Ward Chatters, PT, DPT 09/03/20 7:14 PM  [facility] [address] [place], [place], [address] Phone: [phone]   Fax:  [phone]  Name: Joan Kim MRN: [id number] Date of Birth: 02-Apr-2000   PHYSICAL THERAPY DISCHARGE SUMMARY  Visits from Start of Care: 7  Current functional level related to goals / functional outcomes: Independent; met all goals   Remaining deficits: LE strength increased; continues to have decreased endurance   Education / Equipment: HEP  Plan: Patient agrees to discharge.  Patient goals were met. Patient is being discharged due to meeting the stated rehab goals.

## 2020-09-18 ENCOUNTER — Other Ambulatory Visit: Payer: Self-pay

## 2020-09-18 ENCOUNTER — Ambulatory Visit (INDEPENDENT_AMBULATORY_CARE_PROVIDER_SITE_OTHER): Payer: Medicaid Other | Admitting: Family Medicine

## 2020-09-18 ENCOUNTER — Encounter: Payer: Self-pay | Admitting: Family Medicine

## 2020-09-18 VITALS — BP 108/72 | HR 85 | Ht 65.0 in | Wt 166.6 lb

## 2020-09-18 DIAGNOSIS — F32A Depression, unspecified: Secondary | ICD-10-CM | POA: Diagnosis not present

## 2020-09-18 DIAGNOSIS — E611 Iron deficiency: Secondary | ICD-10-CM | POA: Diagnosis not present

## 2020-09-18 DIAGNOSIS — G4700 Insomnia, unspecified: Secondary | ICD-10-CM

## 2020-09-18 DIAGNOSIS — F419 Anxiety disorder, unspecified: Secondary | ICD-10-CM

## 2020-09-18 HISTORY — DX: Insomnia, unspecified: G47.00

## 2020-09-18 MED ORDER — HYDROXYZINE HCL 10 MG PO TABS: 10.0000 mg | ORAL_TABLET | Freq: Every evening | ORAL | 0 refills | Status: DC | PRN

## 2020-09-18 MED ORDER — SERTRALINE HCL 100 MG PO TABS: 100.0000 mg | ORAL_TABLET | Freq: Every day | ORAL | 1 refills | Status: DC

## 2020-09-18 NOTE — Progress Notes (Signed)
    SUBJECTIVE:   CHIEF COMPLAINT / HPI:   Anxiety and depression Insomnia Current regimen: Zoloft 50 mg, advised to switch to nighttime use at last visit, also advised to use BuSpar at night, which she is doing every night around 9pm Her labs are within normal limits on 5/17 including BMP, CBC, TSH, ferritin She is still having trouble falling asleep, gets to sleep around 2am If she isn't working, gets about 10 hrs of sleep Doesn't feel like it is restful She wakes up and "feels like crap" She doesn't think that she snores or gasps for air while sleeping Has tried melatonin and it helps a little, but feels up groggy She is trying to stop watching TV at night Feels like her mood is doing okay except when she is tired  Iron deficiency ferritin levels increased to 21 Currently taking prenatal vitamins that she has been unable to tolerate other iron supplementation  PERTINENT  PMH / PSH: Migraine, hasimoto's, Vit D deficiency, anxiety and depression, iron deficiency  OBJECTIVE:   BP 108/72   Pulse 85   Ht 5\' 5"  (1.651 m)   Wt 166 lb 9.6 oz (75.6 kg)   LMP 08/29/2020 (Exact Date)   SpO2 99%   BMI 27.72 kg/m    Physical Exam:  General: 20 y.o. female in NAD HEENT: Nares bilaterally patent, tonsils grade 1-2 Lungs: Breathing comfortably on room air Skin: warm and dry Extremities: No edema Psych: Mood and affect appropriate for circumstance, appropriate dress, thought process linear and logical, good insight, no SI   ASSESSMENT/PLAN:   Insomnia Not improving.  She has changed Zoloft to nighttime use, will now increase to 100 mg daily.  We will also start her on hydroxyzine to use as needed.  Advised her that the BuSpar is not working, she does not need to take.  Follow-up in 2 months with new PCP.  Continue to work on sleep hygiene.  Anxiety and depression Stable.  Increasing Zoloft per above.  Iron deficiency Improving.  Continue prenatal vitamin with iron.      26, DO [facility]

## 2020-09-18 NOTE — Assessment & Plan Note (Signed)
Not improving.  She has changed Zoloft to nighttime use, will now increase to 100 mg daily.  We will also start her on hydroxyzine to use as needed.  Advised her that the BuSpar is not working, she does not need to take.  Follow-up in 2 months with new PCP.  Continue to work on sleep hygiene.

## 2020-09-18 NOTE — Patient Instructions (Signed)
Thank you for coming to see me today. It was a pleasure. Today we talked about:   We will increase your Zoloft to 100mg  nightly and you can try hydroxyzine as needed for your insomnia.  Please follow-up with new PCP in 2 months.  If you have any questions or concerns, please do not hesitate to call the office at [phone].  Best,   [phone], DO

## 2020-09-18 NOTE — Assessment & Plan Note (Signed)
Stable.  Increasing Zoloft per above.

## 2020-09-18 NOTE — Assessment & Plan Note (Signed)
Improving.  Continue prenatal vitamin with iron.

## 2020-11-03 ENCOUNTER — Other Ambulatory Visit: Payer: Self-pay | Admitting: Family Medicine

## 2020-11-03 DIAGNOSIS — R1013 Epigastric pain: Secondary | ICD-10-CM

## 2020-11-04 ENCOUNTER — Ambulatory Visit (INDEPENDENT_AMBULATORY_CARE_PROVIDER_SITE_OTHER): Payer: Medicaid Other | Admitting: Family Medicine

## 2020-11-04 ENCOUNTER — Encounter: Payer: Self-pay | Admitting: Family Medicine

## 2020-11-04 ENCOUNTER — Other Ambulatory Visit: Payer: Self-pay

## 2020-11-04 VITALS — BP 110/70 | HR 71 | Ht 65.0 in | Wt 162.0 lb

## 2020-11-04 DIAGNOSIS — M79671 Pain in right foot: Secondary | ICD-10-CM | POA: Diagnosis not present

## 2020-11-04 MED ORDER — DICLOFENAC SODIUM 1 % EX GEL: CUTANEOUS | 0 refills | Status: DC

## 2020-11-04 NOTE — Patient Instructions (Addendum)
It was great to meet you!  Things we discussed at today's visit: - For your foot pain, I suspect you have a muscle strain or a small bone bruise. These can take several weeks to heal. Keep taking the Tylenol every 6 hours and Meloxicam daily. You can continue using heat/ice. - I also sent a topical gel to your pharmacy which you can use up to 3 times daily for the pain - If your pain is not improving in 2-3 weeks, or if you're unable to walk due to pain, please let me know and we will get an xray.   Take care and seek immediate care sooner if you develop any concerns.  Dr. [facility]

## 2020-11-04 NOTE — Progress Notes (Signed)
    SUBJECTIVE:   CHIEF COMPLAINT / HPI:   R Foot Pain Patient reports the top of her right foot hurts. Symptoms started one week ago. Has not gotten worse but is not improving either. Pain is more or less constant, but is worse with walking and with any pressure on top of foot. Taking Tylenol, Meloxicam, and using ice/heat. These are minimally helpful. No known injury or trauma. No prior hx of foot issues. Works at [employer] and is on her feet a lot.  PERTINENT  PMH / PSH: chronic knee pain, anxiety, insomnia, vit D deficiency  OBJECTIVE:   BP 110/70   Pulse 71   Ht 5\' 5"  (1.651 m)   Wt 162 lb (73.5 kg)   LMP 10/21/2020   SpO2 98%   BMI 26.96 kg/m   General: NAD, pleasant, able to participate in exam Respiratory: No respiratory distress Skin: warm and dry, no rashes noted Psych: Normal affect and mood Neuro: grossly intact Right Foot: no obvious swelling or deformity. No skin changes. Full ROM. Mild pain to palpation of dorsal aspect of foot at the distal portion of 2nd metatarsal. 2+ DP pulses. Normal gait.  ASSESSMENT/PLAN:   Right Foot Pain Patient with pain to the dorsal aspect of her right foot x1 week and no known injury. Physical exam overall unremarkable other than mild tenderness to palpation over the distal portion of 2nd metatarsal. I suspect she may have some extensor tendonitis vs muscle strain vs bone bruise. Much lower suspicion for stress fracture at this time. No injury to suggest traumatic fracture. Gait is normal. Recommended ongoing supportive care (Meloxicam, Tylenol, heat/ice) and sent Rx for voltaren gel. If symptoms are worsening or if no improvement in a few weeks, consider obtaining plain films.   10/23/2020, MD [facility]

## 2020-12-12 ENCOUNTER — Other Ambulatory Visit: Payer: Self-pay | Admitting: Family Medicine

## 2020-12-12 DIAGNOSIS — M79671 Pain in right foot: Secondary | ICD-10-CM

## 2020-12-12 DIAGNOSIS — F419 Anxiety disorder, unspecified: Secondary | ICD-10-CM

## 2020-12-12 DIAGNOSIS — F32A Depression, unspecified: Secondary | ICD-10-CM

## 2020-12-12 DIAGNOSIS — G4700 Insomnia, unspecified: Secondary | ICD-10-CM

## 2020-12-12 DIAGNOSIS — M25561 Pain in right knee: Secondary | ICD-10-CM

## 2021-03-05 ENCOUNTER — Other Ambulatory Visit: Payer: Self-pay | Admitting: Family Medicine

## 2021-03-05 ENCOUNTER — Other Ambulatory Visit: Payer: Self-pay | Admitting: Pulmonary Disease

## 2021-03-05 DIAGNOSIS — F419 Anxiety disorder, unspecified: Secondary | ICD-10-CM

## 2021-03-05 DIAGNOSIS — M79671 Pain in right foot: Secondary | ICD-10-CM

## 2021-03-05 DIAGNOSIS — G4700 Insomnia, unspecified: Secondary | ICD-10-CM

## 2021-03-13 ENCOUNTER — Other Ambulatory Visit: Payer: Self-pay | Admitting: Family Medicine

## 2021-03-13 DIAGNOSIS — F419 Anxiety disorder, unspecified: Secondary | ICD-10-CM

## 2021-08-12 IMAGING — CT CT ABD-PELV W/ CM
2 of 4 series · 17 of 46 positions shown, 19 images · IV contrast (APPLIED)
Comparison: None.

CLINICAL DATA: Generalized abdomen pain

EXAM:
CT ABDOMEN AND PELVIS WITH CONTRAST
TECHNIQUE: Multidetector CT imaging of the abdomen and pelvis was performed
using the standard protocol following bolus administration of
intravenous contrast.
CONTRAST:  100mL OMNIPAQUE IOHEXOL 300 MG/ML  SOLN

[Series 3: abdomen 5.0 · axial · 0.94mm/px · z∈[-230,+225]mm · 14 of 103 slices shown, 16 images]
[im 6/103  soft-tissue]
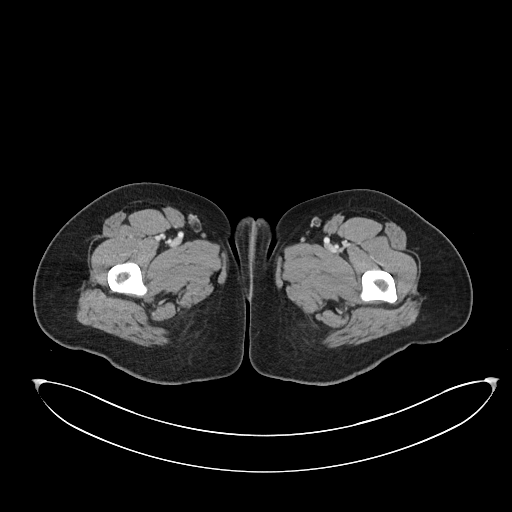
[im 6/103  bone]
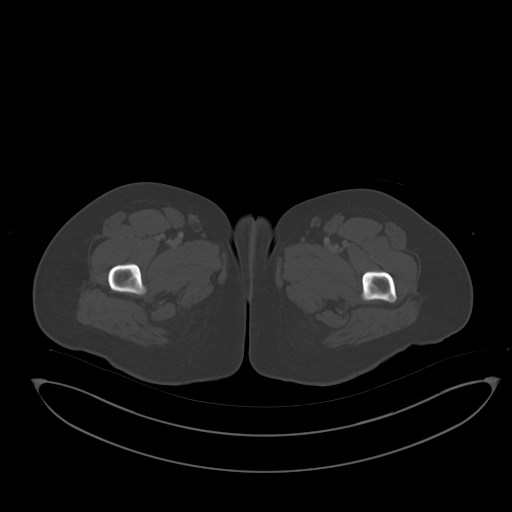
[im 12/103  soft-tissue]
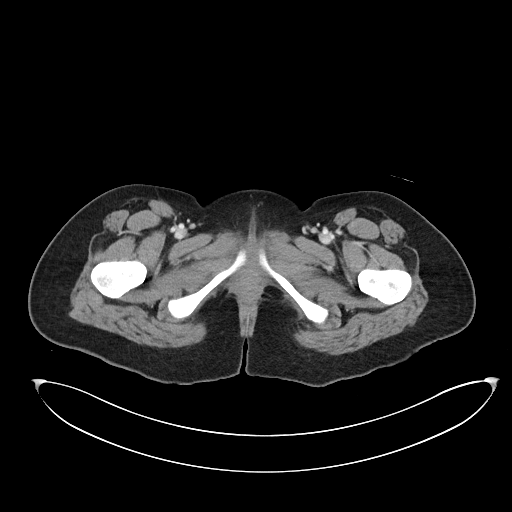
[im 23/103  soft-tissue]
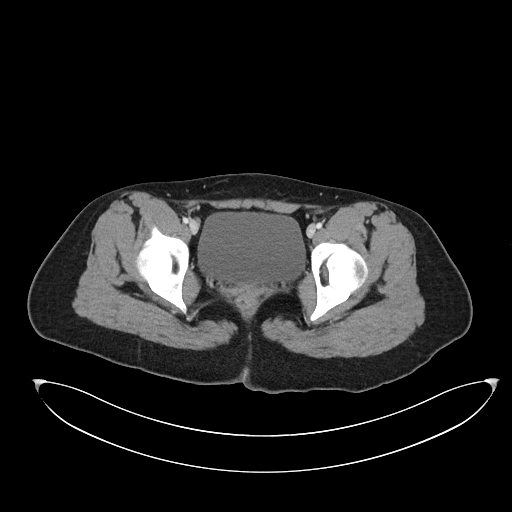
[im 29/103  soft-tissue]
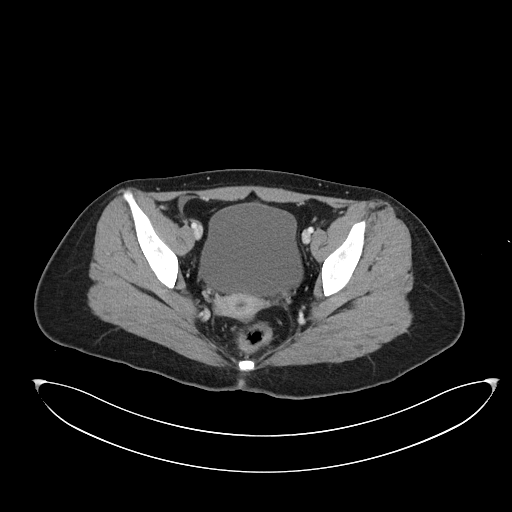
[im 35/103  soft-tissue]
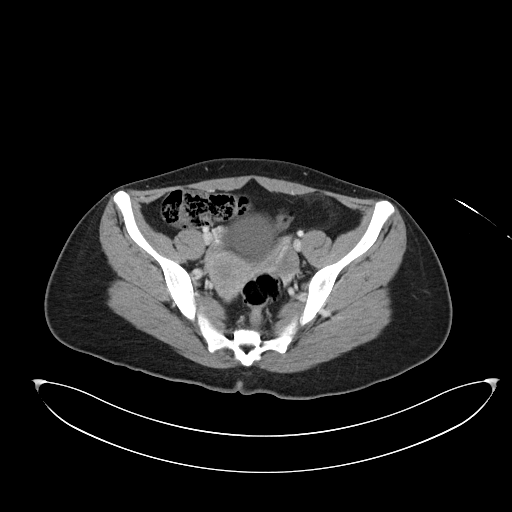
[im 40/103  soft-tissue]
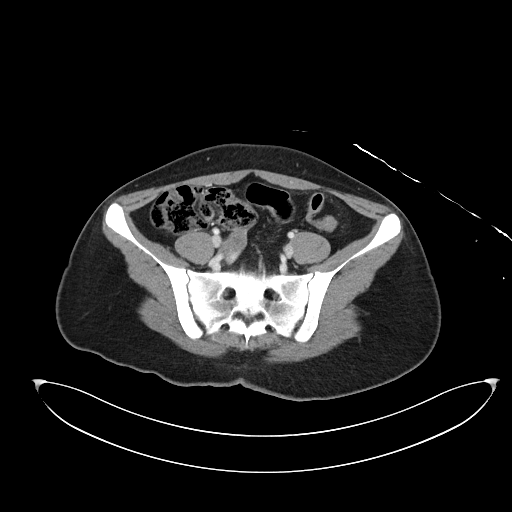
[im 46/103  soft-tissue]
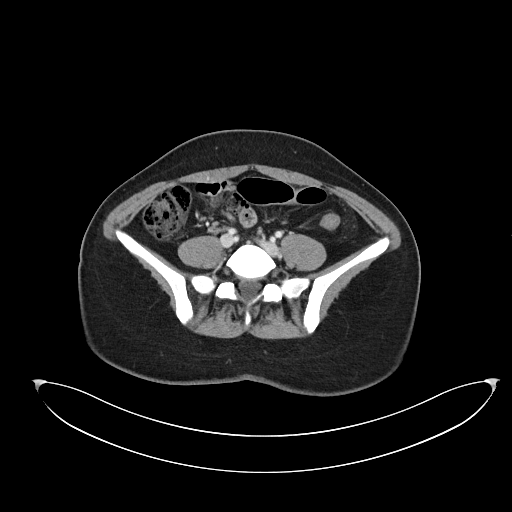
[im 57/103  soft-tissue]
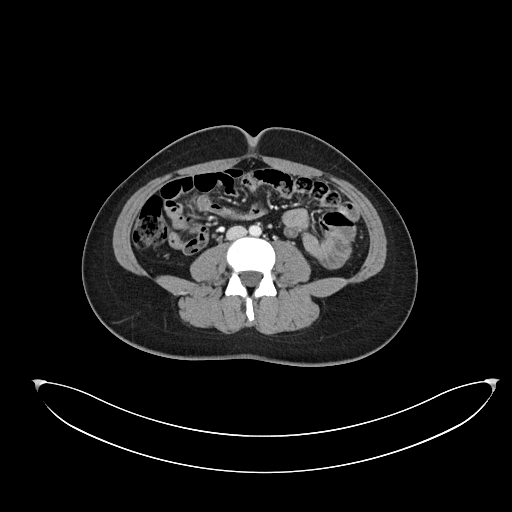
[im 63/103  soft-tissue]
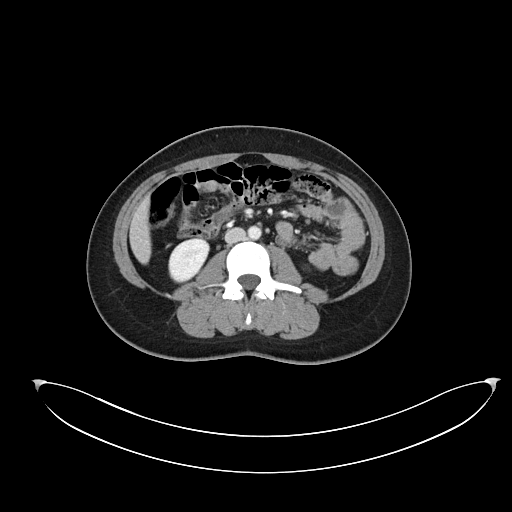
[im 63/103  bone]
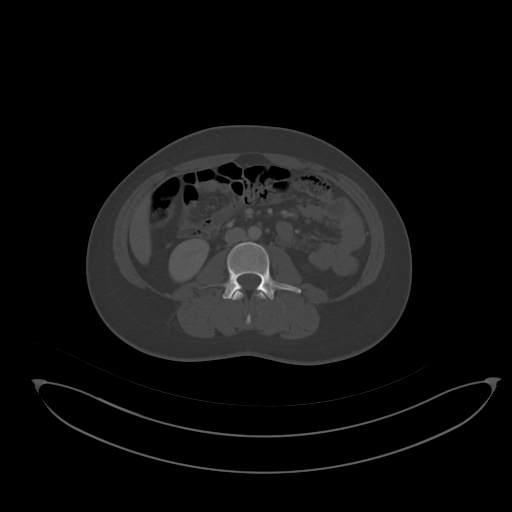
[im 69/103  soft-tissue]
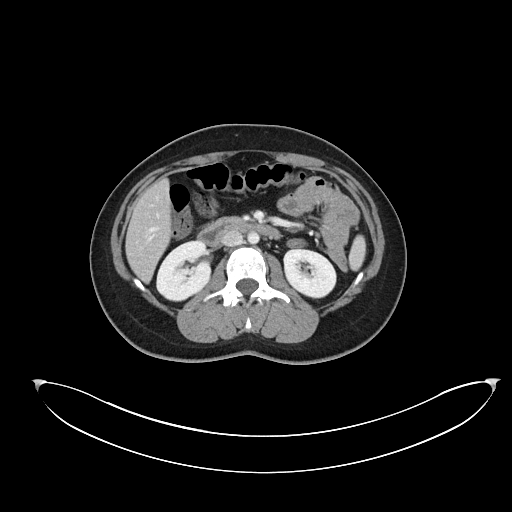
[im 74/103  soft-tissue]
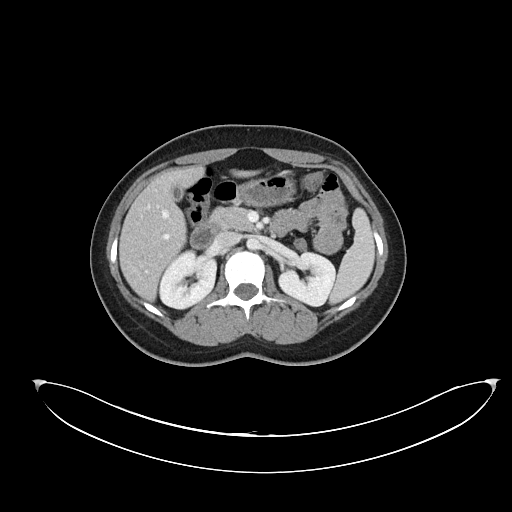
[im 80/103  soft-tissue]
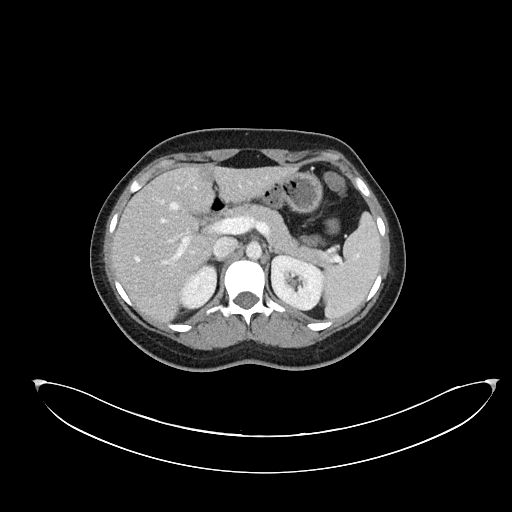
[im 91/103  soft-tissue]
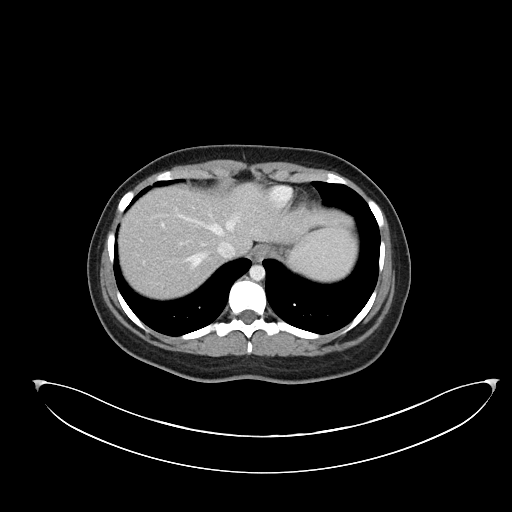
[im 97/103  soft-tissue]
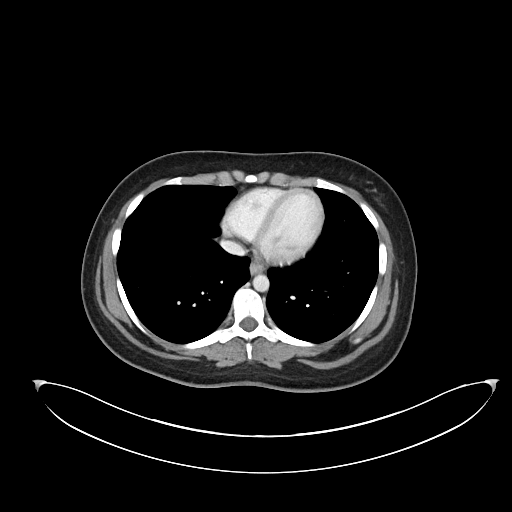

[Series 6: abdomen 3.0 mpr cor · coronal · 0.91mm/px · 3 of 105 slices shown]
[im 35/105  soft-tissue]
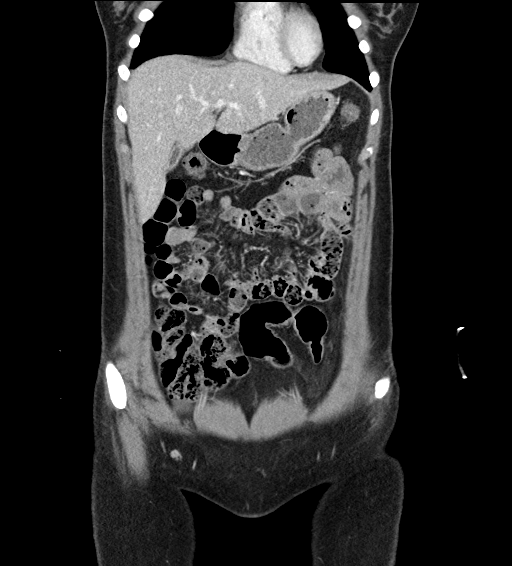
[im 47/105  soft-tissue]
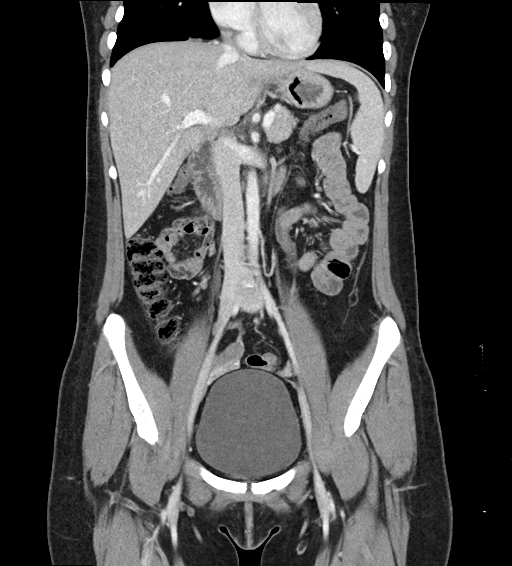
[im 58/105  soft-tissue]
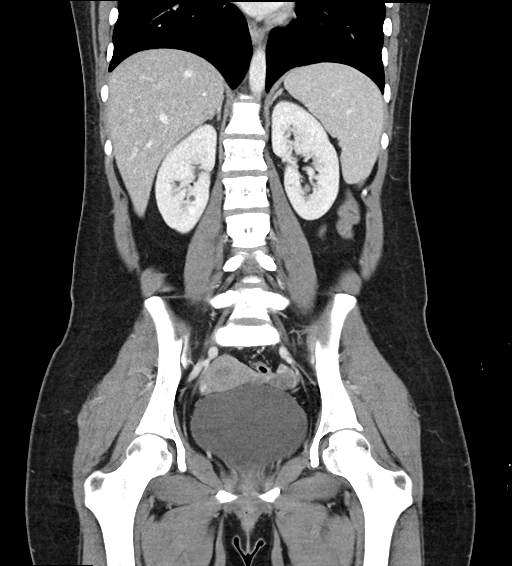

[17 of 46 positions shown; findings below may reference images not displayed]

FINDINGS: Lower chest: No acute abnormality.

Hepatobiliary: No focal liver abnormality is seen. No gallstones,
gallbladder wall thickening, or biliary dilatation.

Pancreas: Unremarkable. No pancreatic ductal dilatation or
surrounding inflammatory changes.

Spleen: Normal in size without focal abnormality.

Adrenals/Urinary Tract: Adrenal glands are unremarkable. Small right
kidney cyst is identified. The left kidney is normal. There is no
hydronephrosis bilaterally. The bladder is normal.

Stomach/Bowel: Stomach is within normal limits. Appendix appears
normal. No evidence of bowel wall thickening, distention, or
inflammatory changes.

Vascular/Lymphatic: No significant vascular findings are present. No
enlarged abdominal or pelvic lymph nodes.

Reproductive: Uterus and bilateral adnexa are unremarkable.

Other: Minimal free fluid is identified in the right inferior
paracolic gutter.

Musculoskeletal: No acute abnormality
IMPRESSION: No bowel obstruction.  The appendix is normal.

No acute abnormality identified in the abdomen and pelvis.

## 2021-09-01 ENCOUNTER — Encounter: Payer: Self-pay | Admitting: *Deleted

## 2021-10-21 DIAGNOSIS — E063 Autoimmune thyroiditis: Secondary | ICD-10-CM | POA: Diagnosis not present

## 2022-02-10 ENCOUNTER — Ambulatory Visit (HOSPITAL_COMMUNITY)
Admission: EM | Admit: 2022-02-10 | Discharge: 2022-02-10 | Disposition: A | Discharge status: Home / Self Care | Payer: Medicaid Other | Attending: Emergency Medicine | Admitting: Emergency Medicine

## 2022-02-10 ENCOUNTER — Encounter (HOSPITAL_COMMUNITY): Payer: Self-pay

## 2022-02-10 DIAGNOSIS — Z3202 Encounter for pregnancy test, result negative: Secondary | ICD-10-CM | POA: Diagnosis not present

## 2022-02-10 DIAGNOSIS — Z20822 Contact with and (suspected) exposure to covid-19: Secondary | ICD-10-CM | POA: Insufficient documentation

## 2022-02-10 DIAGNOSIS — J0140 Acute pansinusitis, unspecified: Secondary | ICD-10-CM | POA: Insufficient documentation

## 2022-02-10 DIAGNOSIS — J069 Acute upper respiratory infection, unspecified: Secondary | ICD-10-CM | POA: Diagnosis not present

## 2022-02-10 DIAGNOSIS — J4521 Mild intermittent asthma with (acute) exacerbation: Secondary | ICD-10-CM | POA: Insufficient documentation

## 2022-02-10 LAB — BASIC METABOLIC PANEL
Anion gap: 10 (ref 5–15)
BUN: 6 mg/dL (ref 6–20)
CO2: 24 mmol/L (ref 22–32)
Calcium: 9.2 mg/dL (ref 8.9–10.3)
Chloride: 105 mmol/L (ref 98–111)
Creatinine, Ser: 0.7 mg/dL (ref 0.44–1.00)
GFR, Estimated: >60 mL/min (ref >60)
Glucose, Bld: 77 mg/dL (ref 70–99)
Potassium: 3.6 mmol/L (ref 3.5–5.1)
Sodium: 139 mmol/L (ref 135–145)

## 2022-02-10 LAB — POC URINE PREG, ED: Preg Test, Ur: NEGATIVE

## 2022-02-10 LAB — RESP PANEL BY RT-PCR (RSV, FLU A&B, COVID)  RVPGX2
Influenza A by PCR: NEGATIVE
Influenza B by PCR: NEGATIVE
Resp Syncytial Virus by PCR: NEGATIVE
SARS Coronavirus 2 by RT PCR: NEGATIVE

## 2022-02-10 MED ORDER — ALBUTEROL SULFATE HFA 108 (90 BASE) MCG/ACT IN AERS: 1.0000 | INHALATION_SPRAY | RESPIRATORY_TRACT | 0 refills | Status: DC | PRN

## 2022-02-10 MED ORDER — AEROCHAMBER MV MISC: 1 refills | Status: DC

## 2022-02-10 MED ORDER — FLUTICASONE PROPIONATE 50 MCG/ACT NA SUSP: 2.0000 | Freq: Every day | NASAL | 0 refills | Status: DC

## 2022-02-10 MED ORDER — PREDNISONE 20 MG PO TABS: 40.0000 mg | ORAL_TABLET | Freq: Every day | ORAL | 0 refills | Status: AC

## 2022-02-10 MED ORDER — IBUPROFEN 600 MG PO TABS: 600.0000 mg | ORAL_TABLET | Freq: Four times a day (QID) | ORAL | 0 refills | Status: DC | PRN

## 2022-02-10 NOTE — Discharge Instructions (Signed)
Your urine pregnancy is negative.  2 puffs from your albuterol inhaler using your spacer every 4 hours for 2 days, then every 6 hours for 2 days, then as needed.  Finish prednisone, unless a healthcare provider tells you to stop.  You can back off on the albuterol if you start to improve sooner.  Saline nasal irrigation with a Lloyd Huger Med rinse and distilled water as often as you want, Flonase, Mucinex D to help prevent a bacterial sinus infection.  600 mg of ibuprofen, 1000 mg Tylenol 3 times a day as needed.  Someone will contact you if your COVID, flu, RSV, are positive and I will prescribe Paxlovid if COVID is positive, Tamiflu if influenza is positive.

## 2022-02-10 NOTE — ED Provider Notes (Signed)
HPI  SUBJECTIVE:  Joan Kim is a 21 y.o. female who presents with fevers Tmax 100, body aches, clear rhinorrhea, postnasal drip, sinus pain and pressure, wheezing, shortness of breath, dyspnea on exertion, nausea starting yesterday.  No facial swelling, upper dental pain, loss of sense of smell taste, sore throat, cough, vomiting, diarrhea, abdominal pain.  She was exposed to RSV by a coworker earlier last week.  No known COVID or flu exposure.  She got 2 doses of the COVID-vaccine.  She did not get this years flu vaccine.  No antibiotics in the past month.  No antipyretic in the past 6 hours.  She tried an allergy medication without improvement in her symptoms.  Symptoms are worse with standing up.  She has a past medical history of asthma, and is compliant with her Advair.  Is needing a refill her albuterol.  She has not used her albuterol over the past 2 days.  She also has a history of Hashimoto's thyroiditis, GERD, anxiety.  No history of chronic kidney disease.  LMP: 10/24.  Not sure if she could be pregnant.  PCP: [facility]    Past Medical History:  Diagnosis Date   Asthma    Bacterial vaginitis    GERD (gastroesophageal reflux disease)    H/O bladder infections    Helicobacter pylori antibody positive 03/2019    Past Surgical History:  Procedure Laterality Date   NO PAST SURGERIES      Family History  Problem Relation Age of Onset   Diabetes Paternal Grandmother    Irritable bowel syndrome Other    Emphysema Maternal Uncle    Colon cancer Neg Hx    Esophageal cancer Neg Hx    Rectal cancer Neg Hx    Stomach cancer Neg Hx     Social History   Tobacco Use   Smoking status: Never    Passive exposure: Yes   Smokeless tobacco: Never  Vaping Use   Vaping Use: Never used  Substance Use Topics   Alcohol use: Never   Drug use: Never    No current facility-administered medications for this encounter.  Current Outpatient Medications:    albuterol  (VENTOLIN HFA) 108 (90 Base) MCG/ACT inhaler, Inhale 1-2 puffs into the lungs every 4 (four) hours as needed for wheezing or shortness of breath., Disp: 1 each, Rfl: 0   fluticasone (FLONASE) 50 MCG/ACT nasal spray, Place 2 sprays into both nostrils daily., Disp: 16 g, Rfl: 0   ibuprofen (ADVIL) 600 MG tablet, Take 1 tablet (600 mg total) by mouth every 6 (six) hours as needed., Disp: 30 tablet, Rfl: 0   predniSONE (DELTASONE) 20 MG tablet, Take 2 tablets (40 mg total) by mouth daily with breakfast for 5 days., Disp: 10 tablet, Rfl: 0   Spacer/Aero-Holding Chambers (AEROCHAMBER MV) inhaler, Use as instructed, Disp: 1 each, Rfl: 1   ADVAIR DISKUS 250-50 MCG/DOSE AEPB, INHALE 1 PUFF INTO THE LUNGS IN THE MORNING AND AT BEDTIME., Disp: 60 each, Rfl: 6   busPIRone (BUSPAR) 5 MG tablet, TAKE 1 TABLET BY MOUTH TWICE A DAY, Disp: 60 tablet, Rfl: 1   diclofenac Sodium (VOLTAREN) 1 % GEL, APPLY TO RIGHT FOOT UP TO THREE TIMES DAILY AS NEEDED FOR PAIN, Disp: 100 g, Rfl: 1   hydrOXYzine (ATARAX) 10 MG tablet, TAKE 1 TABLET (10 MG TOTAL) BY MOUTH AT BEDTIME AS NEEDED (INSOMNIA)., Disp: 30 tablet, Rfl: 1   pantoprazole (PROTONIX) 40 MG tablet, TAKE 1 TABLET BY MOUTH EVERY  DAY, Disp: 30 tablet, Rfl: 3   Prenatal Multivit-Min-Fe-FA (PRENATAL/IRON) TABS, Take 1 tablet by mouth daily., Disp: 90 tablet, Rfl: 3   sertraline (ZOLOFT) 100 MG tablet, TAKE 1 TABLET BY MOUTH EVERY DAY, Disp: 90 tablet, Rfl: 1   SUMAtriptan (IMITREX) 50 MG tablet, Take 1 tablet (50 mg total) by mouth every 2 (two) hours as needed for migraine. May repeat in 2 hours if headache persists or recurs.  Do not take more than 2 in 24 hours., Disp: 10 tablet, Rfl: 0   VITAMIN D PO, Take by mouth daily., Disp: , Rfl:   Allergies  Allergen Reactions   Latex Rash     ROS  As noted in HPI.   Physical Exam  BP 118/76 (BP Location: Left Arm)   Pulse 92   Temp 98.4 F (36.9 C) (Oral)   Resp 16   LMP 01/19/2022 (Exact Date)   SpO2 100%    Constitutional: Well developed, well nourished, no acute distress Eyes:  EOMI, conjunctiva normal bilaterally HENT: Normocephalic, atraumatic,mucus membranes moist.  Mucoid nasal congestion.  Erythematous, swollen turbinates.  Positive maxillary, frontal sinusitis.  Normal oropharynx.  Positive postnasal drip. Neck: No appreciable cervical lymphadenopathy Respiratory: Normal inspiratory effort, lungs clear bilaterally, fair air movement.  Positive anterior chest wall tenderness. Cardiovascular: Normal rate, regular rhythm, no murmurs, rubs, gallops GI: nondistended skin: No rash, skin intact Musculoskeletal: no deformities Neurologic: Alert & oriented x 3, no focal neuro deficits Psychiatric: Speech and behavior appropriate   ED Course   Medications - No data to display  Orders Placed This Encounter  Procedures   Resp panel by RT-PCR (RSV, Flu A&B, Covid) Anterior Nasal Swab    Standing Status:   Standing    Number of Occurrences:   1   Basic metabolic panel    Standing Status:   Standing    Number of Occurrences:   1   POC urine pregnancy    Standing Status:   Standing    Number of Occurrences:   1    Results for orders placed or performed during the hospital encounter of 02/10/22 (from the past 24 hour(s))  POC urine pregnancy     Status: None   Collection Time: 02/10/22  6:05 PM  Result Value Ref Range   Preg Test, Ur NEGATIVE NEGATIVE   No results found.  ED Clinical Impression  1. Upper respiratory tract infection, unspecified type   2. Mild intermittent asthma with acute exacerbation   3. Encounter for laboratory testing for COVID-19 virus   4. Acute non-recurrent pansinusitis      ED Assessment/Plan     Checking urine pregnancy, COVID, flu, RSV because of exposure to RSV.    Urine pregnancy negative.  Presentation consistent with a URI/viral sinusitis.  I am also concerned that she may have an asthma exacerbation.  Home with regularly scheduled  albuterol with a spacer for 4 days, then as needed, prednisone 40 mg for 5 days, saline nasal irrigation, Flonase, Mucinex D, Tylenol/ibuprofen.  We discussed indications for antibiotics for sinus infections.  She does not meet any of the ISDA criteria yet.  Work note.  Follow-up with PCP as needed.  Again BMP.  Will prescribe Paxlovid if COVID is positive since patient is not on any birth control and is not sure if she would like to become pregnant.  Tamiflu if influenza is positive.  Discussed labs, MDM, treatment plan, and plan for follow-up with patient.  patient agrees with plan.  Meds ordered this encounter  Medications   fluticasone (FLONASE) 50 MCG/ACT nasal spray    Sig: Place 2 sprays into both nostrils daily.    Dispense:  16 g    Refill:  0   predniSONE (DELTASONE) 20 MG tablet    Sig: Take 2 tablets (40 mg total) by mouth daily with breakfast for 5 days.    Dispense:  10 tablet    Refill:  0   Spacer/Aero-Holding Chambers (AEROCHAMBER MV) inhaler    Sig: Use as instructed    Dispense:  1 each    Refill:  1   albuterol (VENTOLIN HFA) 108 (90 Base) MCG/ACT inhaler    Sig: Inhale 1-2 puffs into the lungs every 4 (four) hours as needed for wheezing or shortness of breath.    Dispense:  1 each    Refill:  0   ibuprofen (ADVIL) 600 MG tablet    Sig: Take 1 tablet (600 mg total) by mouth every 6 (six) hours as needed.    Dispense:  30 tablet    Refill:  0      *This clinic note was created using Scientist, clinical (histocompatibility and immunogenetics). Therefore, there may be occasional mistakes despite careful proofreading.  ?    Domenick Gong, MD 02/10/22 1810

## 2022-02-10 NOTE — ED Triage Notes (Signed)
bodyache/cough/congestion/chills/headache/fatigue/ nausea/dizzy onset yesterday, worse today upon waking.   No known sick exposure.

## 2022-02-10 NOTE — ED Notes (Signed)
Discharged by Diane M, RN.  

## 2022-04-23 DIAGNOSIS — E063 Autoimmune thyroiditis: Secondary | ICD-10-CM | POA: Diagnosis not present

## 2022-08-03 DIAGNOSIS — O99281 Endocrine, nutritional and metabolic diseases complicating pregnancy, first trimester: Secondary | ICD-10-CM | POA: Diagnosis not present

## 2022-08-03 DIAGNOSIS — Z3201 Encounter for pregnancy test, result positive: Secondary | ICD-10-CM | POA: Diagnosis not present

## 2022-08-03 DIAGNOSIS — E063 Autoimmune thyroiditis: Secondary | ICD-10-CM | POA: Diagnosis not present

## 2022-08-16 ENCOUNTER — Encounter: Payer: Self-pay | Admitting: Family Medicine

## 2022-08-17 ENCOUNTER — Encounter: Payer: Self-pay | Admitting: Family Medicine

## 2022-08-17 ENCOUNTER — Ambulatory Visit (INDEPENDENT_AMBULATORY_CARE_PROVIDER_SITE_OTHER): Payer: Medicaid Other | Admitting: Family Medicine

## 2022-08-17 VITALS — BP 122/86 | HR 71 | Wt 175.8 lb

## 2022-08-17 DIAGNOSIS — Z3A01 Less than 8 weeks gestation of pregnancy: Secondary | ICD-10-CM

## 2022-08-17 DIAGNOSIS — Z0289 Encounter for other administrative examinations: Secondary | ICD-10-CM | POA: Diagnosis present

## 2022-08-17 DIAGNOSIS — Z5689 Other problems related to employment: Secondary | ICD-10-CM | POA: Diagnosis not present

## 2022-08-17 DIAGNOSIS — Z331 Pregnant state, incidental: Secondary | ICD-10-CM

## 2022-08-17 NOTE — Patient Instructions (Signed)
It was great to see you!  Congratulations on your pregnancy. I have written a temporary work note for lifting accommodations and avoidance of power machinery. If your employer has any issues with this, they should reach out to our office directly.  The disability paperwork will need to be completed by your OB provider. Be sure to attend your prenatal appointment on June 14th as scheduled.  Take care, Dr Anner Crete

## 2022-08-17 NOTE — Progress Notes (Unsigned)
    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  Almost 6 weeks by LMP. 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  Her employer requires documentation of specific accommodations before she can return.  Patient requesting restrictions on heavy lifting and operating power machinery.  Brings disability form from her employer today.  She will be getting [facility]. Initial OB appt June 14th   PERTINENT  PMH / PSH: Reviewed  OBJECTIVE:   BP 122/86   Pulse 71   Wt 175 lb 12.8 oz (79.7 kg)   LMP 07/07/2022   SpO2 100%   BMI 29.25 kg/m   General: NAD, pleasant, able to participate in exam Respiratory: No respiratory distress Skin: warm and dry, no rashes noted Psych: Normal affect and mood Neuro: grossly intact  ASSESSMENT/PLAN:   Pregnancy, Employment Concern Per request, letter provided stating patient should not operate power machinery or lift >25lbs for the time being. Follow-up with her OB office as planned on 09/10/2022. Continue PNV. Remainder of meds reviewed and appropriate in pregnancy.   Maury Dus, MD [facility]

## 2022-08-18 ENCOUNTER — Encounter (HOSPITAL_COMMUNITY): Payer: Self-pay

## 2022-08-18 ENCOUNTER — Inpatient Hospital Stay (HOSPITAL_COMMUNITY)
Admission: AD | Admit: 2022-08-18 | Discharge: 2022-08-18 | Disposition: A | Discharge status: Home / Self Care | Payer: Medicaid Other | Attending: Obstetrics and Gynecology | Admitting: Obstetrics and Gynecology

## 2022-08-18 ENCOUNTER — Inpatient Hospital Stay (HOSPITAL_COMMUNITY): Payer: Medicaid Other

## 2022-08-18 DIAGNOSIS — O209 Hemorrhage in early pregnancy, unspecified: Secondary | ICD-10-CM | POA: Diagnosis not present

## 2022-08-18 DIAGNOSIS — O26891 Other specified pregnancy related conditions, first trimester: Secondary | ICD-10-CM | POA: Diagnosis not present

## 2022-08-18 DIAGNOSIS — Z3A01 Less than 8 weeks gestation of pregnancy: Secondary | ICD-10-CM | POA: Diagnosis not present

## 2022-08-18 LAB — CBC
HCT: 39.1 % (ref 36.0–46.0)
Hemoglobin: 13 g/dL (ref 12.0–15.0)
MCH: 27.1 pg (ref 26.0–34.0)
MCHC: 33.2 g/dL (ref 30.0–36.0)
MCV: 81.6 fL (ref 80.0–100.0)
Platelets: 271 10*3/uL (ref 150–400)
RBC: 4.79 MIL/uL (ref 3.87–5.11)
RDW: 12.2 % (ref 11.5–15.5)
WBC: 5.5 10*3/uL (ref 4.0–10.5)
nRBC: 0 % (ref 0.0–0.2)

## 2022-08-18 LAB — URINALYSIS, ROUTINE W REFLEX MICROSCOPIC
Bilirubin Urine: NEGATIVE
Glucose, UA: NEGATIVE mg/dL
Ketones, ur: NEGATIVE mg/dL
Leukocytes,Ua: NEGATIVE
Nitrite: NEGATIVE
Protein, ur: NEGATIVE mg/dL
Specific Gravity, Urine: 1.004 — ABNORMAL LOW (ref 1.005–1.030)
pH: 7 (ref 5.0–8.0)

## 2022-08-18 LAB — ABO/RH: ABO/RH(D): A POS

## 2022-08-18 LAB — WET PREP, GENITAL
Clue Cells Wet Prep HPF POC: NONE SEEN
Sperm: NONE SEEN
Trich, Wet Prep: NONE SEEN
WBC, Wet Prep HPF POC: <10 (ref <10)
Yeast Wet Prep HPF POC: NONE SEEN

## 2022-08-18 LAB — COMPREHENSIVE METABOLIC PANEL
ALT: 15 U/L (ref 0–44)
AST: 16 U/L (ref 15–41)
Albumin: 4 g/dL (ref 3.5–5.0)
Alkaline Phosphatase: 52 U/L (ref 38–126)
Anion gap: 9 (ref 5–15)
BUN: 6 mg/dL (ref 6–20)
CO2: 22 mmol/L (ref 22–32)
Calcium: 8.8 mg/dL — ABNORMAL LOW (ref 8.9–10.3)
Chloride: 105 mmol/L (ref 98–111)
Creatinine, Ser: 0.59 mg/dL (ref 0.44–1.00)
GFR, Estimated: >60 mL/min (ref >60)
Glucose, Bld: 96 mg/dL (ref 70–99)
Potassium: 3.5 mmol/L (ref 3.5–5.1)
Sodium: 136 mmol/L (ref 135–145)
Total Bilirubin: 1.1 mg/dL (ref 0.3–1.2)
Total Protein: 7.3 g/dL (ref 6.5–8.1)

## 2022-08-18 LAB — HCG, QUANTITATIVE, PREGNANCY: hCG, Beta Chain, Quant, S: 1242 m[IU]/mL — ABNORMAL HIGH (ref <5)

## 2022-08-18 NOTE — Discharge Instructions (Signed)
Your ultrasound shows you have a normal pregnancy. We discussed that vaginal bleeding in the first trimester is common, and that 80-90% of patients will go on to have a normal pregnancy with a live delivery. The remainder are at increased risk for miscarriage, unfortunately there are no known interventions to mitigate this risk. We discussed return precautions including crescendo abdominal pain, heavy vaginal bleeding soaking >1 pad/hour, and fever.  Make sure to keep your ultrasound appointment at the [facility] office on September 10, 2022.

## 2022-08-18 NOTE — MAU Provider Note (Signed)
History     [id number]  Arrival date and time: 08/18/22 4098    Chief Complaint  Patient presents with   Abdominal Pain   Vaginal Bleeding     HPI Jodel D Leiss is a 22 y.o. at [redacted]w[redacted]d by sure LMP with PMHx notable for hashimoto's, who presents for vaginal bleeding.   Patient seen by endocrinologist recently who got an hcg level in the 70's This morning she woke up with vaginal bleeding No intercourse for several weeks Denies vaginal itching or discharge Bleeding was only when wiping using the bathroom but has continued Also having some crampy lower abdominal pain, not localized to one side or the other  --/--/A POS (05/22 1108)  OB History     Gravida  1   Para  0   Term  0   Preterm  0   AB  0   Living  0      SAB  0   IAB  0   Ectopic  0   Multiple  0   Live Births  0           Past Medical History:  Diagnosis Date   Asthma    Bacterial vaginitis    GERD (gastroesophageal reflux disease)    H/O bladder infections    Helicobacter pylori antibody positive 03/2019    Past Surgical History:  Procedure Laterality Date   NO PAST SURGERIES      Family History  Problem Relation Age of Onset   Diabetes Paternal Grandmother    Irritable bowel syndrome Other    Emphysema Maternal Uncle    Colon cancer Neg Hx    Esophageal cancer Neg Hx    Rectal cancer Neg Hx    Stomach cancer Neg Hx     Social History   Socioeconomic History   Marital status: Single    Spouse name: Not on file   Number of children: Not on file   Years of education: Not on file   Highest education level: Not on file  Occupational History   Occupation: Student  Tobacco Use   Smoking status: Never    Passive exposure: Yes   Smokeless tobacco: Never  Vaping Use   Vaping Use: Never used  Substance and Sexual Activity   Alcohol use: Never   Drug use: Never   Sexual activity: Yes    Partners: Male    Birth control/protection: Condom  Other Topics Concern    Not on file  Social History Narrative   Single lives with her parents splits time between each layer divorced.   Graduate [place] [employer] 2020   Working for [employer]   No EtOH, tobacco, caffeine, drugs   Social Determinants of Corporate investment banker Strain: Not on file  Food Insecurity: Not on file  Transportation Needs: Not on file  Physical Activity: Not on file  Stress: Not on file  Social Connections: Not on file  Intimate Partner Violence: Not on file    Allergies  Allergen Reactions   Latex Rash    No current facility-administered medications on file prior to encounter.   Current Outpatient Medications on File Prior to Encounter  Medication Sig Dispense Refill   ADVAIR DISKUS 250-50 MCG/DOSE AEPB INHALE 1 PUFF INTO THE LUNGS IN THE MORNING AND AT BEDTIME. 60 each 6   albuterol (VENTOLIN HFA) 108 (90 Base) MCG/ACT inhaler Inhale 1-2 puffs into the lungs every 4 (four) hours as needed for wheezing or shortness  of breath. 1 each 0   busPIRone (BUSPAR) 5 MG tablet TAKE 1 TABLET BY MOUTH TWICE A DAY (Patient not taking: Reported on 08/18/2022) 60 tablet 1   diclofenac Sodium (VOLTAREN) 1 % GEL APPLY TO RIGHT FOOT UP TO THREE TIMES DAILY AS NEEDED FOR PAIN 100 g 1   fluticasone (FLONASE) 50 MCG/ACT nasal spray Place 2 sprays into both nostrils daily. 16 g 0   hydrOXYzine (ATARAX) 10 MG tablet TAKE 1 TABLET (10 MG TOTAL) BY MOUTH AT BEDTIME AS NEEDED (INSOMNIA). (Patient not taking: Reported on 08/18/2022) 30 tablet 1   ibuprofen (ADVIL) 600 MG tablet Take 1 tablet (600 mg total) by mouth every 6 (six) hours as needed. 30 tablet 0   pantoprazole (PROTONIX) 40 MG tablet TAKE 1 TABLET BY MOUTH EVERY DAY (Patient not taking: Reported on 08/18/2022) 30 tablet 3   Prenatal Multivit-Min-Fe-FA (PRENATAL/IRON) TABS Take 1 tablet by mouth daily. 90 tablet 3   sertraline (ZOLOFT) 100 MG tablet TAKE 1 TABLET BY MOUTH EVERY DAY (Patient not taking: Reported on 08/18/2022) 90 tablet 1    Spacer/Aero-Holding Chambers (AEROCHAMBER MV) inhaler Use as instructed 1 each 1   SUMAtriptan (IMITREX) 50 MG tablet Take 1 tablet (50 mg total) by mouth every 2 (two) hours as needed for migraine. May repeat in 2 hours if headache persists or recurs.  Do not take more than 2 in 24 hours. (Patient not taking: Reported on 08/18/2022) 10 tablet 0   VITAMIN D PO Take by mouth daily.       ROS Pertinent positives and negative per HPI, all others reviewed and negative  Physical Exam   BP 130/86 (BP Location: Right Arm)   Pulse 86   Temp 99.2 F (37.3 C) (Oral)   Resp 15   Ht 5\' 7"  (1.702 m)   Wt 80.1 kg   LMP 07/07/2022 (Exact Date)   SpO2 100%   BMI 27.64 kg/m   Patient Vitals for the past 24 hrs:  BP Temp Temp src Pulse Resp SpO2 Height Weight  08/18/22 1011 130/86 99.2 F (37.3 C) Oral 86 15 100 % 5\' 7"  (1.702 m) 80.1 kg    Physical Exam Vitals reviewed.  Constitutional:      General: She is not in acute distress.    Appearance: She is well-developed. She is not diaphoretic.  Eyes:     General: No scleral icterus. Pulmonary:     Effort: Pulmonary effort is normal. No respiratory distress.  Abdominal:     General: There is no distension.     Palpations: Abdomen is soft.     Tenderness: There is no abdominal tenderness. There is no guarding or rebound.  Skin:    General: Skin is warm and dry.  Neurological:     Mental Status: She is alert.     Coordination: Coordination normal.      Cervical Exam    Bedside Ultrasound Pt informed that the ultrasound is considered a limited OB ultrasound and is not intended to be a complete ultrasound exam.  Patient also informed that the ultrasound is not being completed with the intent of assessing for fetal or placental anomalies or any pelvic abnormalities.  Explained that the purpose of today's ultrasound is to assess for  viability.  Patient acknowledges the purpose of the exam and the limitations of the study.      My  interpretation: First trimester findings: Intrauterine gestational sac seen: yes Gestational sac summary: possible yolk sac visualized. Bilateral adnexa  unremarkable.   Labs Results for orders placed or performed during the hospital encounter of 08/18/22 (from the past 24 hour(s))  Urinalysis, Routine w reflex microscopic -Urine, Clean Catch     Status: Abnormal   Collection Time: 08/18/22 10:19 AM  Result Value Ref Range   Color, Urine YELLOW YELLOW   APPearance HAZY (A) CLEAR   Specific Gravity, Urine 1.004 (L) 1.005 - 1.030   pH 7.0 5.0 - 8.0   Glucose, UA NEGATIVE NEGATIVE mg/dL   Hgb urine dipstick LARGE (A) NEGATIVE   Bilirubin Urine NEGATIVE NEGATIVE   Ketones, ur NEGATIVE NEGATIVE mg/dL   Protein, ur NEGATIVE NEGATIVE mg/dL   Nitrite NEGATIVE NEGATIVE   Leukocytes,Ua NEGATIVE NEGATIVE   RBC / HPF 21-50 0 - 5 RBC/hpf   WBC, UA 6-10 0 - 5 WBC/hpf   Bacteria, UA RARE (A) NONE SEEN   Squamous Epithelial / HPF 6-10 0 - 5 /HPF  CBC     Status: None   Collection Time: 08/18/22 11:08 AM  Result Value Ref Range   WBC 5.5 4.0 - 10.5 K/uL   RBC 4.79 3.87 - 5.11 MIL/uL   Hemoglobin 13.0 12.0 - 15.0 g/dL   HCT 16.1 09.6 - 04.5 %   MCV 81.6 80.0 - 100.0 fL   MCH 27.1 26.0 - 34.0 pg   MCHC 33.2 30.0 - 36.0 g/dL   RDW 40.9 81.1 - 91.4 %   Platelets 271 150 - 400 K/uL   nRBC 0.0 0.0 - 0.2 %  Comprehensive metabolic panel     Status: Abnormal   Collection Time: 08/18/22 11:08 AM  Result Value Ref Range   Sodium 136 135 - 145 mmol/L   Potassium 3.5 3.5 - 5.1 mmol/L   Chloride 105 98 - 111 mmol/L   CO2 22 22 - 32 mmol/L   Glucose, Bld 96 70 - 99 mg/dL   BUN 6 6 - 20 mg/dL   Creatinine, Ser 7.82 0.44 - 1.00 mg/dL   Calcium 8.8 (L) 8.9 - 10.3 mg/dL   Total Protein 7.3 6.5 - 8.1 g/dL   Albumin 4.0 3.5 - 5.0 g/dL   AST 16 15 - 41 U/L   ALT 15 0 - 44 U/L   Alkaline Phosphatase 52 38 - 126 U/L   Total Bilirubin 1.1 0.3 - 1.2 mg/dL   GFR, Estimated >95 >62 mL/min   Anion gap 9 5 -  15  hCG, quantitative, pregnancy     Status: Abnormal   Collection Time: 08/18/22 11:08 AM  Result Value Ref Range   hCG, Beta Chain, Quant, S 1,242 (H) <5 mIU/mL  ABO/Rh     Status: None   Collection Time: 08/18/22 11:08 AM  Result Value Ref Range   ABO/RH(D) A POS    No rh immune globuloin      NOT A RH IMMUNE GLOBULIN CANDIDATE, PT RH POSITIVE Performed at [facility], [address], [place], [place] [address]   Wet prep, genital     Status: None   Collection Time: 08/18/22 11:13 AM  Result Value Ref Range   Yeast Wet Prep HPF POC NONE SEEN NONE SEEN   Trich, Wet Prep NONE SEEN NONE SEEN   Clue Cells Wet Prep HPF POC NONE SEEN NONE SEEN   WBC, Wet Prep HPF POC <10 <10   Sperm NONE SEEN     Imaging US OB LESS THAN 14 WEEKS WITH OB TRANSVAGINAL  Result Date: 08/18/2022 CLINICAL DATA:  First trimester  pregnancy with vaginal bleeding and pelvic pain today. LMP 07/07/2022. EXAM: OBSTETRIC <14 WK [place] AND TRANSVAGINAL OB US TECHNIQUE: Both transabdominal and transvaginal ultrasound examinations were performed for complete evaluation of the gestation as well as the maternal uterus, adnexal regions, and pelvic cul-de-sac. Transvaginal technique was performed to assess early pregnancy. COMPARISON:  None Available. FINDINGS: Intrauterine gestational sac: Probable small intrauterine gestational sac near the fundus. Yolk sac:  Probable small yolk sac measuring 4 mm Embryo:  Not visualized Cardiac Activity: Not visualized Subchorionic hemorrhage:  None visualized. Maternal uterus/adnexae: No evidence of adnexal mass or free pelvic fluid. IMPRESSION: Probable early intrauterine gestational sac and small yolk sac, but no fetal pole or cardiac activity yet visualized. Recommend follow-up quantitative B-HCG levels and follow-up US in 14 days to assess viability. This recommendation follows SRU consensus guidelines: Diagnostic Criteria for Nonviable Pregnancy Early in the First Trimester. Malva Limes  Med 2013; [id number]. No suspicious adnexal findings. Electronically Signed   By: Carey Bullocks M.D.   On: 08/18/2022 12:23    MAU Course  Procedures Lab Orders         Wet prep, genital         Urinalysis, Routine w reflex microscopic -Urine, Clean Catch         CBC         Comprehensive metabolic panel         hCG, quantitative, pregnancy    No orders of the defined types were placed in this encounter.  Imaging Orders         US OB LESS THAN 14 WEEKS WITH OB TRANSVAGINAL     MDM Moderate (Level 3-4)  Assessment and Plan  #Vaginal bleeding in pregnancy, first trimester #[redacted] weeks gestation of pregnancy US shows IUP with yolk sac. We discussed that vaginal bleeding in the first trimester is common, and that 80-90% of patients will go on to have a normal pregnancy with a live delivery. The remainder are at increased risk for miscarriage, unfortunately there are no known interventions to mitigate this risk. --/--/A POS (05/22 1108), rhogam not indicated. We discussed return precautions including crescendo abdominal pain, heavy vaginal bleeding soaking >1 pad/hour, and fever.  Patient already scheduled for viability scan at [facility] office on 09/10/2022.     Dispo: discharged to home in stable condition    Venora Maples, MD/MPH 08/18/22 1:44 PM  Allergies as of 08/18/2022       Reactions   Latex Rash        Medication List     STOP taking these medications    busPIRone 5 MG tablet Commonly known as: BUSPAR   diclofenac Sodium 1 % Gel Commonly known as: VOLTAREN   hydrOXYzine 10 MG tablet Commonly known as: ATARAX   ibuprofen 600 MG tablet Commonly known as: ADVIL   pantoprazole 40 MG tablet Commonly known as: PROTONIX   sertraline 100 MG tablet Commonly known as: ZOLOFT   SUMAtriptan 50 MG tablet Commonly known as: Imitrex       TAKE these medications    Advair Diskus 250-50 MCG/ACT Aepb Generic drug: fluticasone-salmeterol INHALE 1 PUFF  INTO THE LUNGS IN THE MORNING AND AT BEDTIME.   AeroChamber MV inhaler Use as instructed   albuterol 108 (90 Base) MCG/ACT inhaler Commonly known as: VENTOLIN HFA Inhale 1-2 puffs into the lungs every 4 (four) hours as needed for wheezing or shortness of breath.   fluticasone 50 MCG/ACT nasal spray Commonly known as: FLONASE Place  2 sprays into both nostrils daily.   Prenatal/Iron Tabs Take 1 tablet by mouth daily.   VITAMIN D PO Take by mouth daily.

## 2022-08-18 NOTE — MAU Note (Signed)
...  Joan Kim is a 22 y.o. at Unknown here in MAU reporting: Vaginal bleeding that began this morning around 30-45 minutes ago. She reports she saw the blood when she wiped but did not see any in the toilet. She reports she is also experiencing intermittent lower abdominal pain. Denies seeing any blood clots. Denies vaginal itching and vaginal discharge but reports an odor. Denies recent IC.   Patient very tearful in triage. On 5/7 patient's endocrinologist collected an hCG - 71.93 mIU/mL - patient showed this RN results on her phone. Verified name and DOB.   LMP: 07/07/2022 Onset of complaint: This morning Pain score: 4/10 lower abdomen  Lab orders placed from triage: UA

## 2022-08-19 ENCOUNTER — Inpatient Hospital Stay (HOSPITAL_COMMUNITY)
Admission: AD | Admit: 2022-08-19 | Discharge: 2022-08-19 | Disposition: A | Discharge status: Home / Self Care | Payer: Medicaid Other | Attending: Obstetrics & Gynecology | Admitting: Obstetrics & Gynecology

## 2022-08-19 ENCOUNTER — Other Ambulatory Visit: Payer: Self-pay

## 2022-08-19 ENCOUNTER — Encounter (HOSPITAL_COMMUNITY): Payer: Self-pay | Admitting: Obstetrics & Gynecology

## 2022-08-19 ENCOUNTER — Inpatient Hospital Stay (HOSPITAL_COMMUNITY): Payer: Medicaid Other

## 2022-08-19 DIAGNOSIS — O039 Complete or unspecified spontaneous abortion without complication: Secondary | ICD-10-CM | POA: Insufficient documentation

## 2022-08-19 DIAGNOSIS — O26899 Other specified pregnancy related conditions, unspecified trimester: Secondary | ICD-10-CM

## 2022-08-19 DIAGNOSIS — Z3A01 Less than 8 weeks gestation of pregnancy: Secondary | ICD-10-CM

## 2022-08-19 DIAGNOSIS — O209 Hemorrhage in early pregnancy, unspecified: Secondary | ICD-10-CM

## 2022-08-19 DIAGNOSIS — Z3A00 Weeks of gestation of pregnancy not specified: Secondary | ICD-10-CM | POA: Diagnosis not present

## 2022-08-19 LAB — URINALYSIS, ROUTINE W REFLEX MICROSCOPIC
Bilirubin Urine: NEGATIVE
Glucose, UA: NEGATIVE mg/dL
Ketones, ur: NEGATIVE mg/dL
Leukocytes,Ua: NEGATIVE
Nitrite: NEGATIVE
Protein, ur: 100 mg/dL — AB
Specific Gravity, Urine: 1.025 (ref 1.005–1.030)
pH: 7.5 (ref 5.0–8.0)

## 2022-08-19 LAB — URINALYSIS, MICROSCOPIC (REFLEX)
Bacteria, UA: NONE SEEN
RBC / HPF: >50 RBC/hpf (ref 0–5)

## 2022-08-19 LAB — GC/CHLAMYDIA PROBE AMP (~~LOC~~) NOT AT ARMC
Chlamydia: NEGATIVE
Comment: NEGATIVE
Comment: NORMAL
Neisseria Gonorrhea: NEGATIVE

## 2022-08-19 MED ORDER — ACETAMINOPHEN-CODEINE 300-30 MG PO TABS: 1.0000 | ORAL_TABLET | Freq: Four times a day (QID) | ORAL | 0 refills | Status: AC | PRN

## 2022-08-19 MED ORDER — IBUPROFEN 600 MG PO TABS: 600.0000 mg | ORAL_TABLET | Freq: Four times a day (QID) | ORAL | 0 refills | Status: DC | PRN

## 2022-08-19 MED ORDER — PROMETHAZINE HCL 12.5 MG PO TABS: 12.5000 mg | ORAL_TABLET | Freq: Four times a day (QID) | ORAL | 0 refills | Status: DC | PRN

## 2022-08-19 NOTE — MAU Note (Signed)
Joan Kim is a 22 y.o. at [redacted]w[redacted]d here in MAU reporting: she was seen yesterday for VB and abdominal cramping.  Reports VB has increased today, bleeding is bright red and passing small clots.  Reports currently wearing a pad, RN assessed pad for amount of bleeding, small VB noted.  Reports currently has minimal pain on bilateral sides, mostly on right, but was worse earlier.  Reports hasn't taken meds to treat pain. LMP: NA Onset of complaint: today Pain score: 2 Vitals:   08/19/22 1535  BP: 124/61  Pulse: 77  Resp: 19  Temp: 98.1 F (36.7 C)  SpO2: 96%     FHT:NA Lab orders placed from triage:   UA

## 2022-08-19 NOTE — MAU Provider Note (Signed)
History     CSN: [id number]  Arrival date and time: 08/19/22 1431   Event Date/Time   First Provider Initiated Contact with Patient 08/19/22 1745      Chief Complaint  Patient presents with   Vaginal Bleeding   HPI  Ms. Joan Kim is a 22 y.o. year old G33P0000 female at [redacted]w[redacted]d weeks gestation who presents to MAU reporting seen yesterday for vaginal bleeding and abdominal cramping. She reports the VB has increased by "a lot" today. She called her OB office and was told to come here. She reported lower abdominal pain; R>L. She rates the pain 2/10, and has not taken anything for the pain. She has been wearing the pad she is currently wearing since about 1230 and there is a moderate amount of pinkish-red blood on it. She receives [facility] with [facility]; next appt is 09/10/2022. Her significant other is present and contributing to the history taking.    OB History     Gravida  1   Para  0   Term  0   Preterm  0   AB  0   Living  0      SAB  0   IAB  0   Ectopic  0   Multiple  0   Live Births  0           Past Medical History:  Diagnosis Date   Asthma    Bacterial vaginitis    GERD (gastroesophageal reflux disease)    H/O bladder infections    Helicobacter pylori antibody positive 03/2019    Past Surgical History:  Procedure Laterality Date   NO PAST SURGERIES      Family History  Problem Relation Age of Onset   Diabetes Paternal Grandmother    Irritable bowel syndrome Other    Emphysema Maternal Uncle    Colon cancer Neg Hx    Esophageal cancer Neg Hx    Rectal cancer Neg Hx    Stomach cancer Neg Hx     Social History   Tobacco Use   Smoking status: Never    Passive exposure: Yes   Smokeless tobacco: Never  Vaping Use   Vaping Use: Never used  Substance Use Topics   Alcohol use: Never   Drug use: Never    Allergies:  Allergies  Allergen Reactions   Latex Rash    Medications Prior to Admission  Medication Sig Dispense  Refill Last Dose   ADVAIR DISKUS 250-50 MCG/DOSE AEPB INHALE 1 PUFF INTO THE LUNGS IN THE MORNING AND AT BEDTIME. 60 each 6    albuterol (VENTOLIN HFA) 108 (90 Base) MCG/ACT inhaler Inhale 1-2 puffs into the lungs every 4 (four) hours as needed for wheezing or shortness of breath. 1 each 0    fluticasone (FLONASE) 50 MCG/ACT nasal spray Place 2 sprays into both nostrils daily. 16 g 0    Prenatal Multivit-Min-Fe-FA (PRENATAL/IRON) TABS Take 1 tablet by mouth daily. 90 tablet 3    Spacer/Aero-Holding Chambers (AEROCHAMBER MV) inhaler Use as instructed 1 each 1    VITAMIN D PO Take by mouth daily.       Review of Systems  Constitutional: Negative.   HENT: Negative.    Eyes: Negative.   Respiratory: Negative.    Cardiovascular: Negative.   Gastrointestinal: Negative.   Endocrine: Negative.   Genitourinary:  Positive for vaginal bleeding.  Musculoskeletal: Negative.   Skin: Negative.   Allergic/Immunologic: Negative.   Neurological: Negative.   Hematological:  Negative.   Psychiatric/Behavioral: Negative.     Physical Exam   Blood pressure 124/61, pulse 77, temperature 98.1 F (36.7 C), temperature source Oral, resp. rate 19, height 5\' 6"  (1.676 m), weight 79.6 kg, last menstrual period 07/07/2022, SpO2 96 %.  Physical Exam Vitals and nursing note reviewed. Exam conducted with a chaperone present.  Constitutional:      Appearance: Normal appearance. She is normal weight.  Cardiovascular:     Rate and Rhythm: Normal rate.  Pulmonary:     Effort: Pulmonary effort is normal.  Musculoskeletal:        General: Normal range of motion.  Neurological:     Mental Status: She is alert and oriented to person, place, and time.  Psychiatric:        Mood and Affect: Mood normal.        Behavior: Behavior normal.        Thought Content: Thought content normal.        Judgment: Judgment normal.    MAU Course  Procedures  MDM OB <14 wks U/S  US OB Transvaginal  Result Date:  08/19/2022 CLINICAL DATA:  Vaginal bleeding, cramping, beta HCG 1,242 on 08/18/2022 EXAM: TRANSVAGINAL OB ULTRASOUND TECHNIQUE: Transvaginal ultrasound was performed for complete evaluation of the gestation as well as the maternal uterus, adnexal regions, and pelvic cul-de-sac. COMPARISON:  08/18/2022 FINDINGS: Intrauterine gestational sac: None Yolk sac:  Not Visualized. Embryo:  Not Visualized. Maternal uterus/adnexae: The probable intrauterine gestational sac seen previously is no longer identified, consistent with spontaneous abortion. Trace pelvic free fluid. Left ovary measures 1.9 x 2.4 x 1.8 cm in the right ovary measures 1.9 x 3.3 x 2.4 cm. No adnexal masses. IMPRESSION: 1. No evidence of intrauterine pregnancy on this exam. The gestational sac seen within the uterus on previous exam is no longer identified, consistent with spontaneous abortion. 2. Trace pelvic free fluid.  No adnexal masses. Electronically Signed   By: Sharlet Salina M.D.   On: 08/19/2022 19:56    Assessment and Plan  1. Miscarriage - Information provided on SAB - F/U HCG in 1 week  2. Vaginal bleeding affecting early pregnancy - Information provided on VB in pregnancy - Return to MAU: If you have heavier bleeding that soaks through more that 2 pads per hour for an hour or more If you bleed so much that you feel like you might pass out or you do pass out If you have significant abdominal pain that is not improved with Tylenol 1000 mg every 8 hours as needed for pain If you develop a fever > 100.5    3. Abdominal cramping affecting pregnancy, antepartum - Rx: Tylenol #3 1-2 po every 6 hours prn pain - Rx: Ibuprofen 600 mg every 6 hrs pain - Rx: Phenergan 12.5 mg po every 6 hrs prn N/V  4. [redacted] weeks gestation of pregnancy   - Discharge patient - F/U with [facility] in 1 week  Message sent to admin pool to get pt scheduled - Patient verbalized an understanding of the plan of care and agrees.    Raelyn Mora,  CNM 08/19/2022, 5:45 PM

## 2022-08-19 NOTE — Discharge Instructions (Signed)
Return to MAU: °If you have heavier bleeding that soaks through more that 2 pads per hour for an hour or more °If you bleed so much that you feel like you might pass out or you do pass out °If you have significant abdominal pain that is not improved with Tylenol 1000 mg every 8 hours as needed for pain °If you develop a fever > 100.5 °

## 2022-08-19 NOTE — Telephone Encounter (Signed)
TC to pt regarding triage vm message [facility].  Pt reports increase in vaginal bleeding and very uncomfortable notes pain 6/10x, notes now passing clots.  states in using the bathroom she is filling up the toilet with blood.  advised to go back to the hospital for an evaluation . Pt agreeable and voiced understanding.

## 2022-08-25 ENCOUNTER — Encounter: Payer: Self-pay | Admitting: *Deleted

## 2022-08-26 ENCOUNTER — Other Ambulatory Visit: Payer: Medicaid Other

## 2022-08-26 ENCOUNTER — Encounter: Payer: Self-pay | Admitting: *Deleted

## 2022-08-26 DIAGNOSIS — O039 Complete or unspecified spontaneous abortion without complication: Secondary | ICD-10-CM

## 2022-08-27 LAB — BETA HCG QUANT (REF LAB): hCG Quant: 12 m[IU]/mL

## 2022-09-02 ENCOUNTER — Encounter: Payer: Self-pay | Admitting: Family Medicine

## 2022-09-02 ENCOUNTER — Ambulatory Visit: Payer: Medicaid Other | Admitting: Family Medicine

## 2022-09-02 VITALS — BP 116/78 | HR 80 | Wt 177.0 lb

## 2022-09-02 DIAGNOSIS — O039 Complete or unspecified spontaneous abortion without complication: Secondary | ICD-10-CM | POA: Diagnosis not present

## 2022-09-02 DIAGNOSIS — Z3A01 Less than 8 weeks gestation of pregnancy: Secondary | ICD-10-CM | POA: Diagnosis not present

## 2022-09-02 NOTE — Progress Notes (Signed)
   Subjective:    Patient ID: Joan Kim is a 22 y.o. female presenting with Follow-up  on 09/02/2022  HPI: Patient is a G1P0010 who is s/p SAB on 08/19/22. She is Rh positive. She is no longer bleeding. Ready to try again.  Review of Systems  Constitutional:  Negative for chills and fever.  Respiratory:  Negative for shortness of breath.   Cardiovascular:  Negative for chest pain.  Gastrointestinal:  Negative for abdominal pain, nausea and vomiting.  Genitourinary:  Negative for dysuria.  Skin:  Negative for rash.      Objective:    BP 116/78   Pulse 80   Wt 177 lb (80.3 kg)   LMP 07/07/2022 (Exact Date)   BMI 28.57 kg/m  Physical Exam Exam conducted with a chaperone present.  Constitutional:      General: She is not in acute distress.    Appearance: She is well-developed.  HENT:     Head: Normocephalic and atraumatic.  Eyes:     General: No scleral icterus. Cardiovascular:     Rate and Rhythm: Normal rate.  Pulmonary:     Effort: Pulmonary effort is normal.  Abdominal:     Palpations: Abdomen is soft.  Musculoskeletal:     Cervical back: Neck supple.  Skin:    General: Skin is warm and dry.  Neurological:     Mental Status: She is alert and oriented to person, place, and time.         Assessment & Plan:   Problem List Items Addressed This Visit   None Visit Diagnoses     Miscarriage    -  Primary   Can attempt again right away. continue PNVs. Optimize thyroid.       Return if symptoms worsen or fail to improve.  Reva Bores, MD 09/02/2022 1:18 PM

## 2022-09-02 NOTE — Progress Notes (Signed)
Patient here for F/U SAB.  Denies any bleeding today none since last Sunday  CC: cramping.

## 2022-09-03 DIAGNOSIS — R7989 Other specified abnormal findings of blood chemistry: Secondary | ICD-10-CM | POA: Diagnosis not present

## 2022-09-03 DIAGNOSIS — R946 Abnormal results of thyroid function studies: Secondary | ICD-10-CM | POA: Diagnosis not present

## 2022-09-13 ENCOUNTER — Other Ambulatory Visit: Payer: Self-pay

## 2022-09-13 MED ORDER — ALBUTEROL SULFATE HFA 108 (90 BASE) MCG/ACT IN AERS: 1.0000 | INHALATION_SPRAY | RESPIRATORY_TRACT | 0 refills | Status: DC | PRN

## 2022-09-21 ENCOUNTER — Encounter: Payer: Self-pay | Admitting: Family Medicine

## 2022-09-28 DIAGNOSIS — E063 Autoimmune thyroiditis: Secondary | ICD-10-CM | POA: Diagnosis not present

## 2022-10-01 ENCOUNTER — Encounter: Payer: Medicaid Other | Admitting: Family Medicine

## 2022-10-08 ENCOUNTER — Other Ambulatory Visit (INDEPENDENT_AMBULATORY_CARE_PROVIDER_SITE_OTHER): Payer: Medicaid Other

## 2022-10-08 ENCOUNTER — Ambulatory Visit (INDEPENDENT_AMBULATORY_CARE_PROVIDER_SITE_OTHER): Payer: Medicaid Other | Admitting: *Deleted

## 2022-10-08 VITALS — BP 125/81 | HR 89 | Wt 180.0 lb

## 2022-10-08 DIAGNOSIS — Z3481 Encounter for supervision of other normal pregnancy, first trimester: Secondary | ICD-10-CM

## 2022-10-08 DIAGNOSIS — Z3A01 Less than 8 weeks gestation of pregnancy: Secondary | ICD-10-CM | POA: Diagnosis not present

## 2022-10-08 DIAGNOSIS — E063 Autoimmune thyroiditis: Secondary | ICD-10-CM | POA: Diagnosis not present

## 2022-10-08 DIAGNOSIS — O3680X0 Pregnancy with inconclusive fetal viability, not applicable or unspecified: Secondary | ICD-10-CM

## 2022-10-08 MED ORDER — SERTRALINE HCL 100 MG PO TABS: 100.0000 mg | ORAL_TABLET | Freq: Every day | ORAL | 6 refills | Status: DC

## 2022-10-08 NOTE — Progress Notes (Signed)
New OB Intake  I explained I am completing New OB Intake today. We discussed EDD of 06/01/2023, by Ultrasound. Pt is G2P0010. I reviewed her allergies, medications and Medical/Surgical/OB history.    Patient Active Problem List   Diagnosis Date Noted   Insomnia 09/18/2020   Fatigue 08/12/2020   Migraine without aura and without status migrainosus, not intractable 04/23/2020   Iron deficiency 12/26/2019   Anxiety and depression 10/04/2019   Hashimoto's thyroiditis 08/30/2019   Vitamin D deficiency 08/30/2019   Dyspepsia 08/30/2019    Concerns addressed today  Patient informed that the ultrasound is considered a limited obstetric ultrasound and is not intended to be a complete ultrasound exam.  Patient also informed that the ultrasound is not being completed with the intent of assessing for fetal or placental anomalies or any pelvic abnormalities. Explained that the purpose of today's ultrasound is to assess for viability.  Patient acknowledges the purpose of the exam and the limitations of the study.     First visit review  Will have patient return in 2 weeks for repeat scan due to history of SAB. Will complete new OB intake at that visit.    Scheryl Marten, RN 10/08/2022  8:54 AM

## 2022-10-22 ENCOUNTER — Other Ambulatory Visit (INDEPENDENT_AMBULATORY_CARE_PROVIDER_SITE_OTHER): Payer: Medicaid Other

## 2022-10-22 ENCOUNTER — Ambulatory Visit (INDEPENDENT_AMBULATORY_CARE_PROVIDER_SITE_OTHER): Payer: Medicaid Other | Admitting: *Deleted

## 2022-10-22 VITALS — BP 116/77 | HR 99 | Wt 181.0 lb

## 2022-10-22 DIAGNOSIS — O0990 Supervision of high risk pregnancy, unspecified, unspecified trimester: Secondary | ICD-10-CM | POA: Insufficient documentation

## 2022-10-22 DIAGNOSIS — O219 Vomiting of pregnancy, unspecified: Secondary | ICD-10-CM

## 2022-10-22 DIAGNOSIS — Z3A08 8 weeks gestation of pregnancy: Secondary | ICD-10-CM

## 2022-10-22 DIAGNOSIS — Z3400 Encounter for supervision of normal first pregnancy, unspecified trimester: Secondary | ICD-10-CM

## 2022-10-22 DIAGNOSIS — O3680X0 Pregnancy with inconclusive fetal viability, not applicable or unspecified: Secondary | ICD-10-CM

## 2022-10-22 DIAGNOSIS — Z3481 Encounter for supervision of other normal pregnancy, first trimester: Secondary | ICD-10-CM | POA: Diagnosis not present

## 2022-10-22 DIAGNOSIS — Z3401 Encounter for supervision of normal first pregnancy, first trimester: Secondary | ICD-10-CM | POA: Insufficient documentation

## 2022-10-22 DIAGNOSIS — Z3490 Encounter for supervision of normal pregnancy, unspecified, unspecified trimester: Secondary | ICD-10-CM | POA: Insufficient documentation

## 2022-10-22 HISTORY — DX: Vomiting of pregnancy, unspecified: O21.9

## 2022-10-22 MED ORDER — PROMETHAZINE HCL 12.5 MG PO TABS: 12.5000 mg | ORAL_TABLET | Freq: Four times a day (QID) | ORAL | 1 refills | Status: DC | PRN

## 2022-10-22 NOTE — Progress Notes (Signed)
New OB Intake  I explained I am completing New OB Intake and repeat scan today. We discussed EDD of 06/01/2023, by early Ultrasound. Pt is G2P0010. I reviewed her allergies, medications and Medical/Surgical/OB history.    Patient Active Problem List   Diagnosis Date Noted   Encounter for supervision of normal first pregnancy in first trimester 10/22/2022   Nausea/vomiting in pregnancy 10/22/2022   Migraine without aura and without status migrainosus, not intractable 04/23/2020   Iron deficiency 12/26/2019   Anxiety and depression 10/04/2019   Hashimoto's thyroiditis 08/30/2019   Vitamin D deficiency 08/30/2019   Dyspepsia 08/30/2019    Concerns addressed today  Patient informed that the ultrasound is considered a limited obstetric ultrasound and is not intended to be a complete ultrasound exam.  Patient also informed that the ultrasound is not being completed with the intent of assessing for fetal or placental anomalies or any pelvic abnormalities. Explained that the purpose of today's ultrasound is to assess for viability.  Patient acknowledges the purpose of the exam and the limitations of the study.     Delivery Plans Plans to deliver at [facility]. Discussed the nature of our practice with multiple providers including residents and students. Due to the size of the practice, the delivering provider may not be the same as those providing prenatal care.   MyChart/Babyscripts MyChart access verified. I explained pt will have some visits in office and some virtually. Babyscripts app discussed and ordered.   Blood Pressure Cuff Blood pressure cuff discussed and given Discussed to be used for virtual visits and or if needed BP checks weekly.  Anatomy US Explained first scheduled [place] will be around 19 weeks.   Last Pap  NA-needs pap at [facility]  First visit review I reviewed new OB appt with patient. Explained pt will be seen by Dr Shawnie Pons at first visit. Discussed Avelina Laine genetic screening  with patient would like both [facility] and [facility].. Routine prenatal labs collected today.    Scheryl Marten, RN 10/22/2022  8:46 AM

## 2022-10-26 ENCOUNTER — Encounter: Payer: Self-pay | Admitting: Family Medicine

## 2022-10-29 ENCOUNTER — Encounter (HOSPITAL_COMMUNITY): Payer: Self-pay | Admitting: Obstetrics and Gynecology

## 2022-10-29 ENCOUNTER — Inpatient Hospital Stay (HOSPITAL_COMMUNITY)
Admission: AD | Admit: 2022-10-29 | Discharge: 2022-10-30 | Disposition: A | Discharge status: Home / Self Care | Payer: Medicaid Other | Source: Home / Self Care | Attending: Obstetrics and Gynecology | Admitting: Obstetrics and Gynecology

## 2022-10-29 DIAGNOSIS — J45909 Unspecified asthma, uncomplicated: Secondary | ICD-10-CM | POA: Diagnosis not present

## 2022-10-29 DIAGNOSIS — O99511 Diseases of the respiratory system complicating pregnancy, first trimester: Secondary | ICD-10-CM | POA: Diagnosis not present

## 2022-10-29 DIAGNOSIS — Z3401 Encounter for supervision of normal first pregnancy, first trimester: Secondary | ICD-10-CM

## 2022-10-29 DIAGNOSIS — Z3A09 9 weeks gestation of pregnancy: Secondary | ICD-10-CM | POA: Insufficient documentation

## 2022-10-29 DIAGNOSIS — R42 Dizziness and giddiness: Secondary | ICD-10-CM | POA: Diagnosis not present

## 2022-10-29 DIAGNOSIS — R0602 Shortness of breath: Secondary | ICD-10-CM | POA: Diagnosis not present

## 2022-10-29 DIAGNOSIS — O26891 Other specified pregnancy related conditions, first trimester: Secondary | ICD-10-CM | POA: Diagnosis present

## 2022-10-29 DIAGNOSIS — O219 Vomiting of pregnancy, unspecified: Secondary | ICD-10-CM

## 2022-10-29 LAB — URINALYSIS, ROUTINE W REFLEX MICROSCOPIC
Bilirubin Urine: NEGATIVE
Glucose, UA: NEGATIVE mg/dL
Hgb urine dipstick: NEGATIVE
Ketones, ur: NEGATIVE mg/dL
Leukocytes,Ua: NEGATIVE
Nitrite: NEGATIVE
Protein, ur: NEGATIVE mg/dL
Specific Gravity, Urine: 1.005 (ref 1.005–1.030)
pH: 8 (ref 5.0–8.0)

## 2022-10-29 LAB — GLUCOSE, CAPILLARY: Glucose-Capillary: 81 mg/dL (ref 70–99)

## 2022-10-29 MED ORDER — ALBUTEROL SULFATE HFA 108 (90 BASE) MCG/ACT IN AERS
2.0000 | INHALATION_SPRAY | Freq: Once | RESPIRATORY_TRACT | Status: AC
  Administered 2022-10-30: 2 via RESPIRATORY_TRACT
  Filled 2022-10-29: qty 6.7

## 2022-10-29 NOTE — MAU Note (Addendum)
Pt says she has trouble breathing - started at 9am [facility]- she called office- 630pm Has hx- asthma- did not feel like breathing was bad enough to use inhaler.  Breathing is worse now.  She feels hot-temp at home was 99.2- 99.7- at 630pm  Feels dizzy all day - had to sit -  No appetite - feels nausea- has meds-but has not been home all day- been to [facility] and family - when she got home at 630pm- not take any meds  Also has back pain- started Wed- took  reg Ty 2 tabs yesterday- 1pm - no meds  Today

## 2022-10-30 DIAGNOSIS — R0602 Shortness of breath: Secondary | ICD-10-CM | POA: Diagnosis not present

## 2022-10-30 NOTE — MAU Provider Note (Signed)
History     CSN: [id number]  Arrival date and time: 10/29/22 1911   None     Chief Complaint  Patient presents with   Shortness of Breath   HPI Joan Kim is a 22yo G2P0010 with IUP at 9wd2 who presents with complaints today of feeling light-headed, having decreased appetite, and feeling like it is difficult to take a deep breath. Denies vag bldg or pain; no vomiting today or UTI s/s. She has been seen by [facility] for her preg care. Has a hx of asthma and has seen a pulmonologist in [place] but 'it has probably been about 2 years.' In the past used daily Advair but her rx ran out.  OB History     Gravida  2   Para  0   Term  0   Preterm  0   AB  1   Living  0      SAB  1   IAB  0   Ectopic  0   Multiple  0   Live Births  0           Past Medical History:  Diagnosis Date   Asthma    Bacterial vaginitis    Fatigue 08/12/2020   GERD (gastroesophageal reflux disease)    H/O bladder infections    Helicobacter pylori antibody positive 03/2019   Insomnia 09/18/2020    Past Surgical History:  Procedure Laterality Date   NO PAST SURGERIES      Family History  Problem Relation Age of Onset   Emphysema Maternal Uncle    Diabetes Paternal Grandmother    Irritable bowel syndrome Other    Colon cancer Neg Hx    Esophageal cancer Neg Hx    Rectal cancer Neg Hx    Stomach cancer Neg Hx     Social History   Tobacco Use   Smoking status: Never    Passive exposure: Yes   Smokeless tobacco: Never  Vaping Use   Vaping status: Never Used  Substance Use Topics   Alcohol use: Never   Drug use: Never    Allergies:  Allergies  Allergen Reactions   Latex Rash    Medications Prior to Admission  Medication Sig Dispense Refill Last Dose   acetaminophen (TYLENOL) 325 MG tablet Take 650 mg by mouth every 6 (six) hours as needed.   10/27/2022   ADVAIR DISKUS 250-50 MCG/DOSE AEPB INHALE 1 PUFF INTO THE LUNGS IN THE MORNING AND AT BEDTIME. 60 each 6  10/19/2022   albuterol (VENTOLIN HFA) 108 (90 Base) MCG/ACT inhaler Inhale 1-2 puffs into the lungs every 4 (four) hours as needed for wheezing or shortness of breath. 1 each 0 More than a month   busPIRone (BUSPAR) 5 MG tablet Take 5 mg by mouth 3 (three) times daily.   More than a month   fluticasone (FLONASE) 50 MCG/ACT nasal spray Place 2 sprays into both nostrils daily. 16 g 0 More than a month   levothyroxine (SYNTHROID) 50 MCG tablet Take 50 mcg by mouth every morning.      Prenatal Multivit-Min-Fe-FA (PRENATAL/IRON) TABS Take 1 tablet by mouth daily. 90 tablet 3 More than a month   promethazine (PHENERGAN) 12.5 MG tablet Take 1 tablet (12.5 mg total) by mouth every 6 (six) hours as needed for nausea or vomiting. 30 tablet 1 not started yet   sertraline (ZOLOFT) 100 MG tablet Take 1 tablet (100 mg total) by mouth daily. 30 tablet 6 10/27/2022  Spacer/Aero-Holding Chambers (AEROCHAMBER MV) inhaler Use as instructed 1 each 1 More than a month   VITAMIN D PO Take by mouth daily.   More than a month    Review of Systems  Constitutional:  Positive for appetite change.  Respiratory:  Positive for shortness of breath. Negative for cough.    Physical Exam   Blood pressure 120/73, pulse 85, temperature 98.4 F (36.9 C), resp. rate 16, height 5\' 5"  (1.651 m), weight 82.4 kg, SpO2 100%, unknown if currently breastfeeding.  Physical Exam Constitutional:      Appearance: She is well-developed.  HENT:     Head: Normocephalic.  Cardiovascular:     Rate and Rhythm: Normal rate and regular rhythm.  Pulmonary:     Effort: Pulmonary effort is normal.     Breath sounds: Normal breath sounds.  Musculoskeletal:        General: Normal range of motion.     Cervical back: Normal range of motion.  Skin:    General: Skin is warm and dry.  Neurological:     General: No focal deficit present.     Mental Status: She is alert.  Psychiatric:        Mood and Affect: Mood normal.        Behavior:  Behavior normal.   CBG: 81  MAU Course  Procedures  MDM Discussed hypotension/hypoglycemia as causes for feeling light-headed in preg; currently her vitals are normal; has inhaler at home- didn't use today> after using inhaler, feels a little jittery with a little improvement in breathing.  Assessment and Plan  IUP@[redacted]w[redacted]d  Difficulty taking deep breaths with hx asthma Light-headed  -D/C home in light of normal exam -Recommended eat small amts of protein throughout the day as able to maintain stable blood glucose -Recommended to re-establish care with pulmonologist in [place] for recommendations -Keep next scheduled OB appt  Arabella Merles CNM 10/30/2022, 12:00 AM

## 2022-10-30 NOTE — MAU Provider Note (Incomplete)
History     CSN: [id number]  Arrival date and time: 10/29/22 1911   None     Chief Complaint  Patient presents with  . Shortness of Breath   HPI Ms Korzeniewski is a 22yo G2P0010 with IUP at 9wd2 who presents with complaints today of feeling light-headed, having decreased appetite, and feeling like it is difficult to take a deep breath. Denies vag bldg or pain; no vomiting today or UTI s/s. She has been seen by [facility] for her preg care.  OB History     Gravida  2   Para  0   Term  0   Preterm  0   AB  1   Living  0      SAB  1   IAB  0   Ectopic  0   Multiple  0   Live Births  0           Past Medical History:  Diagnosis Date  . Asthma   . Bacterial vaginitis   . Fatigue 08/12/2020  . GERD (gastroesophageal reflux disease)   . H/O bladder infections   . Helicobacter pylori antibody positive 03/2019  . Insomnia 09/18/2020    Past Surgical History:  Procedure Laterality Date  . NO PAST SURGERIES      Family History  Problem Relation Age of Onset  . Emphysema Maternal Uncle   . Diabetes Paternal Grandmother   . Irritable bowel syndrome Other   . Colon cancer Neg Hx   . Esophageal cancer Neg Hx   . Rectal cancer Neg Hx   . Stomach cancer Neg Hx     Social History   Tobacco Use  . Smoking status: Never    Passive exposure: Yes  . Smokeless tobacco: Never  Vaping Use  . Vaping status: Never Used  Substance Use Topics  . Alcohol use: Never  . Drug use: Never    Allergies:  Allergies  Allergen Reactions  . Latex Rash    Medications Prior to Admission  Medication Sig Dispense Refill Last Dose  . acetaminophen (TYLENOL) 325 MG tablet Take 650 mg by mouth every 6 (six) hours as needed.   10/27/2022  . ADVAIR DISKUS 250-50 MCG/DOSE AEPB INHALE 1 PUFF INTO THE LUNGS IN THE MORNING AND AT BEDTIME. 60 each 6 10/19/2022  . albuterol (VENTOLIN HFA) 108 (90 Base) MCG/ACT inhaler Inhale 1-2 puffs into the lungs every 4 (four) hours as needed for  wheezing or shortness of breath. 1 each 0 More than a month  . busPIRone (BUSPAR) 5 MG tablet Take 5 mg by mouth 3 (three) times daily.   More than a month  . fluticasone (FLONASE) 50 MCG/ACT nasal spray Place 2 sprays into both nostrils daily. 16 g 0 More than a month  . levothyroxine (SYNTHROID) 50 MCG tablet Take 50 mcg by mouth every morning.     . Prenatal Multivit-Min-Fe-FA (PRENATAL/IRON) TABS Take 1 tablet by mouth daily. 90 tablet 3 More than a month  . promethazine (PHENERGAN) 12.5 MG tablet Take 1 tablet (12.5 mg total) by mouth every 6 (six) hours as needed for nausea or vomiting. 30 tablet 1 not started yet  . sertraline (ZOLOFT) 100 MG tablet Take 1 tablet (100 mg total) by mouth daily. 30 tablet 6 10/27/2022  . Spacer/Aero-Holding Chambers (AEROCHAMBER MV) inhaler Use as instructed 1 each 1 More than a month  . VITAMIN D PO Take by mouth daily.   More than a month  Review of Systems  Constitutional:  Positive for appetite change.  Respiratory:  Positive for shortness of breath. Negative for cough.    Physical Exam   Blood pressure 120/73, pulse 85, temperature 98.4 F (36.9 C), resp. rate 16, height 5\' 5"  (1.651 m), weight 82.4 kg, SpO2 100%, unknown if currently breastfeeding.  Physical Exam Constitutional:      Appearance: She is well-developed.  HENT:     Head: Normocephalic.  Cardiovascular:     Rate and Rhythm: Normal rate and regular rhythm.  Musculoskeletal:     Cervical back: Normal range of motion.  Neurological:     Mental Status: She is alert.     MAU Course  Procedures  MDM ***  Assessment and Plan  ***  Arabella Merles 10/30/2022, 12:00 AM

## 2022-11-02 ENCOUNTER — Encounter: Payer: Self-pay | Admitting: Family Medicine

## 2022-11-03 ENCOUNTER — Ambulatory Visit (INDEPENDENT_AMBULATORY_CARE_PROVIDER_SITE_OTHER): Payer: Medicaid Other | Admitting: Family Medicine

## 2022-11-03 ENCOUNTER — Encounter: Payer: Self-pay | Admitting: Family Medicine

## 2022-11-03 VITALS — BP 119/79 | HR 82 | Wt 182.0 lb

## 2022-11-03 DIAGNOSIS — R55 Syncope and collapse: Secondary | ICD-10-CM | POA: Diagnosis not present

## 2022-11-03 HISTORY — DX: Syncope and collapse: R55

## 2022-11-03 NOTE — Progress Notes (Signed)
   Subjective:    Patient ID: Joan Kim is a 22 y.o. female presenting with Consult  on 11/03/2022  HPI: Reports increasing dizziness, light--headedness while sitting, despite eating recently. Notes some increased heart racing. Feels like she is having increased heart burn and notes she feels like she cannot breathe. Lasts about 1 hour, then resolves. Her inhaler does not seem to help.  Review of Systems  Constitutional:  Negative for chills and fever.  Respiratory:  Positive for shortness of breath.   Cardiovascular:  Negative for chest pain.  Gastrointestinal:  Negative for abdominal pain, nausea and vomiting.  Genitourinary:  Negative for dysuria.  Skin:  Negative for rash.  Neurological:  Positive for dizziness and light-headedness.      Objective:    BP 119/79   Pulse 82   Wt 182 lb (82.6 kg)   LMP  (LMP Unknown)   BMI 30.29 kg/m  Physical Exam Exam conducted with a chaperone present.  Constitutional:      General: She is not in acute distress.    Appearance: She is well-developed.  HENT:     Head: Normocephalic and atraumatic.  Eyes:     General: No scleral icterus. Cardiovascular:     Rate and Rhythm: Normal rate.  Pulmonary:     Effort: Pulmonary effort is normal.  Abdominal:     Palpations: Abdomen is soft.  Musculoskeletal:     Cervical back: Neck supple.  Skin:    General: Skin is warm and dry.  Neurological:     Mental Status: She is alert and oriented to person, place, and time.         Assessment & Plan:   Problem List Items Addressed This Visit       Unprioritized   Near syncope - Primary    Suspect SVT given symptoms, which occur at rest and are not associated with dehydration, standing or other activity. Will refer to Cardio OB for Zio and w/u. May need LOA from work.      Relevant Orders   AMB Referral to Cardio Obstetrics    No follow-ups on file.  Reva Bores, MD 11/03/2022 3:17 PM

## 2022-11-03 NOTE — Progress Notes (Signed)
Early OB added to scheduled to discuss concerns.  MAU on 10/29/22 c/o light-headed, hard to breath.  Most recent episode  that she was going to faint was yesterday, states "room was spinning, got really weak". Reports nausea taking Rx as directed.   Pt states co workers checked blood sugar and it was low. Cannot recall at this time.

## 2022-11-03 NOTE — Assessment & Plan Note (Signed)
Suspect SVT given symptoms, which occur at rest and are not associated with dehydration, standing or other activity. Will refer to Cardio OB for Zio and w/u. May need LOA from work.

## 2022-11-04 DIAGNOSIS — E063 Autoimmune thyroiditis: Secondary | ICD-10-CM | POA: Diagnosis not present

## 2022-11-04 DIAGNOSIS — O99281 Endocrine, nutritional and metabolic diseases complicating pregnancy, first trimester: Secondary | ICD-10-CM | POA: Diagnosis not present

## 2022-11-11 ENCOUNTER — Encounter: Payer: Medicaid Other | Admitting: Obstetrics and Gynecology

## 2022-11-12 ENCOUNTER — Ambulatory Visit: Payer: Medicaid Other | Admitting: Cardiology

## 2022-11-12 ENCOUNTER — Ambulatory Visit: Payer: Medicaid Other | Attending: Cardiology

## 2022-11-12 VITALS — BP 107/79 | HR 91 | Ht 66.0 in | Wt 179.8 lb

## 2022-11-12 DIAGNOSIS — Z136 Encounter for screening for cardiovascular disorders: Secondary | ICD-10-CM

## 2022-11-12 DIAGNOSIS — Z3401 Encounter for supervision of normal first pregnancy, first trimester: Secondary | ICD-10-CM | POA: Diagnosis not present

## 2022-11-12 DIAGNOSIS — Z3A11 11 weeks gestation of pregnancy: Secondary | ICD-10-CM | POA: Diagnosis not present

## 2022-11-12 DIAGNOSIS — R55 Syncope and collapse: Secondary | ICD-10-CM | POA: Diagnosis not present

## 2022-11-12 NOTE — Progress Notes (Unsigned)
Enrolled patient for a 14 day Zio XT  monitor to be mailed to patients home  °

## 2022-11-12 NOTE — Progress Notes (Unsigned)
Cardio-Obstetrics Clinic  New Evaluation  Date:  11/14/2022   ID:  Joan Kim, DOB November 10, 2000, MRN [id number]  PCP:  Ivery Quale, MD   [facility] Providers Cardiologist:  Thomasene Ripple, DO  Electrophysiologist:  None       Referring MD: Reva Bores, MD   Chief Complaint: " I am having palpiations"  History of Present Illness:    Joan Kim is a 22 y.o. female [G2P0010] who is being seen today for the evaluation of presyncope at the request of Reva Bores, MD.   Medical hx includes Asthma and GERd.   She reports that she has been experiencing intermittent episode of lightheadedness and dizziness. She reports that this has been going on for some time now. It is an abrupt episode. She is not sure of her heart rate at that time because she reports that she is shivering and has not noticed much. She denies bowel or bladder incontinence.  She is [redacted] weeks pregnant  Prior CV Studies Reviewed: The following studies were reviewed today: TTE 12/17/2019     ECHOCARDIOGRAM REPORT       Patient Name:   Joan Kim Date of Exam: 2019/12/17 Medical Rec #:  [id number]         Height:       66.0 in Accession #:    [id number]        Weight:       160.0 lb Date of Birth:  [birth year]         BSA:          1.819 m Patient Age:    19 years          BP:           122/70 mmHg Patient Gender: F                 HR:           69 bpm. Exam Location:  [facility]  Procedure: 2D Echo, Color Doppler and Cardiac Doppler  Indications:     Dyspnea 786.09                  Murmur 785.2   History:         Patient has no prior history of Echocardiogram examinations. No                  cardiac history listed in chart.   Sonographer:     Cristela Blue RDCS (AE) Referring Phys:  [id number] Salena Saner Diagnosing Phys: Lorine Bears MD    Sonographer Comments: Suboptimal apical window and no subcostal window. IMPRESSIONS    1. Left ventricular ejection fraction, by  estimation, is 60 to 65%. The left ventricle has normal function. The left ventricle has no regional wall motion abnormalities. Left ventricular diastolic parameters were normal.  2. Right ventricular systolic function is normal. The right ventricular size is normal. Tricuspid regurgitation signal is inadequate for assessing PA pressure.  3. The mitral valve is normal in structure. No evidence of mitral valve regurgitation. No evidence of mitral stenosis.  4. The aortic valve is normal in structure. Aortic valve regurgitation is not visualized. No aortic stenosis is present.  FINDINGS  Left Ventricle: Left ventricular ejection fraction, by estimation, is 60 to 65%. The left ventricle has normal function. The left ventricle has no regional wall motion abnormalities. The left ventricular internal cavity size was normal in size. There is  no left  ventricular hypertrophy. Left ventricular diastolic parameters were normal.  Right Ventricle: The right ventricular size is normal. No increase in right ventricular wall thickness. Right ventricular systolic function is normal. Tricuspid regurgitation signal is inadequate for assessing PA pressure. The tricuspid regurgitant velocity is 1.51 m/s, and with an assumed right atrial pressure of 10 mmHg, the estimated right ventricular systolic pressure is 19.1 mmHg.  Left Atrium: Left atrial size was normal in size.  Right Atrium: Right atrial size was normal in size.  Pericardium: There is no evidence of pericardial effusion.  Mitral Valve: The mitral valve is normal in structure. Normal mobility of the mitral valve leaflets. No evidence of mitral valve regurgitation. No evidence of mitral valve stenosis.  Tricuspid Valve: The tricuspid valve is normal in structure. Tricuspid valve regurgitation is not demonstrated. No evidence of tricuspid stenosis.  Aortic Valve: The aortic valve is normal in structure. Aortic valve regurgitation is not  visualized. No aortic stenosis is present. Aortic valve mean gradient measures 4.0 mmHg. Aortic valve peak gradient measures 6.2 mmHg. Aortic valve area, by VTI measures 1.65 cm.  Pulmonic Valve: The pulmonic valve was normal in structure. Pulmonic valve regurgitation is not visualized. No evidence of pulmonic stenosis.  Aorta: The aortic root is normal in size and structure.  Venous: The inferior vena cava was not well visualized.  IAS/Shunts: No atrial level shunt detected by color flow Doppler.    Past Medical History:  Diagnosis Date   Asthma    Bacterial vaginitis    Fatigue 08/12/2020   GERD (gastroesophageal reflux disease)    H/O bladder infections    Helicobacter pylori antibody positive 03/2019   Insomnia 09/18/2020    Past Surgical History:  Procedure Laterality Date   NO PAST SURGERIES        OB History     Gravida  2   Para  0   Term  0   Preterm  0   AB  1   Living  0      SAB  1   IAB  0   Ectopic  0   Multiple  0   Live Births  0               Current Medications: Current Meds  Medication Sig   albuterol (VENTOLIN HFA) 108 (90 Base) MCG/ACT inhaler Inhale 1-2 puffs into the lungs every 4 (four) hours as needed for wheezing or shortness of breath.   busPIRone (BUSPAR) 5 MG tablet Take 5 mg by mouth 3 (three) times daily.   levothyroxine (SYNTHROID) 50 MCG tablet Take 50 mcg by mouth every morning.   Prenatal Multivit-Min-Fe-FA (PRENATAL/IRON) TABS Take 1 tablet by mouth daily.   promethazine (PHENERGAN) 12.5 MG tablet Take 1 tablet (12.5 mg total) by mouth every 6 (six) hours as needed for nausea or vomiting.   sertraline (ZOLOFT) 100 MG tablet Take 1 tablet (100 mg total) by mouth daily.     Allergies:   Latex   Social History   Socioeconomic History   Marital status: Single    Spouse name: Not on file   Number of children: Not on file   Years of education: Not on file   Highest education level: Not on file   Occupational History   Occupation: Student  Tobacco Use   Smoking status: Never    Passive exposure: Yes   Smokeless tobacco: Never  Vaping Use   Vaping status: Never Used  Substance and Sexual Activity  Alcohol use: Never   Drug use: Never   Sexual activity: Yes    Partners: Male    Birth control/protection: Condom  Other Topics Concern   Not on file  Social History Narrative   Single lives with her parents splits time between each layer divorced.   Graduate [employer] 2020   Working for [employer]   No EtOH, tobacco, caffeine, drugs   Social Determinants of Corporate investment banker Strain: Not on file  Food Insecurity: Not on file  Transportation Needs: No Transportation Needs (11/14/2022)   PRAPARE - Administrator, Civil Service (Medical): No    Lack of Transportation (Non-Medical): No  Physical Activity: Not on file  Stress: Not on file  Social Connections: Not on file      Family History  Problem Relation Age of Onset   Emphysema Maternal Uncle    Diabetes Paternal Grandmother    Irritable bowel syndrome Other    Colon cancer Neg Hx    Esophageal cancer Neg Hx    Rectal cancer Neg Hx    Stomach cancer Neg Hx       ROS:   Please see the history of present illness.     All other systems reviewed and are negative.   Labs/EKG Reviewed:    EKG:   EKG was  ordered today.  The ekg ordered today demonstrates sinus rhythm, HR 88 bpm  Recent Labs: 08/18/2022: ALT 15; BUN 6; Creatinine, Ser 0.59; Potassium 3.5; Sodium 136 10/22/2022: Hemoglobin 12.9; Platelets 313   Recent Lipid Panel No results found for: "CHOL", "TRIG", "HDL", "CHOLHDL", "LDLCALC", "LDLDIRECT"  Physical Exam:    VS:  BP 107/79 (BP Location: Left Arm, Patient Position: Sitting, Cuff Size: Normal)   Pulse 91   Ht 5\' 6"  (1.676 m)   Wt 179 lb 12.8 oz (81.6 kg)   LMP  (LMP Unknown)   SpO2 97%   BMI 29.02 kg/m     Wt Readings from Last 3 Encounters:  11/12/22  179 lb 12.8 oz (81.6 kg)  11/03/22 182 lb (82.6 kg)  10/29/22 181 lb 9.6 oz (82.4 kg)     GEN:  Well nourished, well developed in no acute distress HEENT: Normal NECK: No JVD; No carotid bruits LYMPHATICS: No lymphadenopathy CARDIAC: RRR, no murmurs, rubs, gallops RESPIRATORY:  Clear to auscultation without rales, wheezing or rhonchi  ABDOMEN: Soft, non-tender, non-distended MUSCULOSKELETAL:  No edema; No deformity  SKIN: Warm and dry NEUROLOGIC:  Alert and oriented x 3 PSYCHIATRIC:  Normal affect    Risk Assessment/Risk Calculators:     CARPREG II Risk Prediction Index Score:  1.  The patient's risk for a primary cardiac event is 5%.            ASSESSMENT & PLAN:    Presyncope   I reviewed her echo which was done in August of 2021 which did not show any evidence of structure. She will benefit from a zio monitor to understand if cardiac arrhythmia is playing a role.   Encourage to eat some saltine crackers daily.   Patient Instructions  Medication Instructions:  Your physician recommends that you continue on your current medications as directed. Please refer to the Current Medication list given to you today.   *If you need a refill on your cardiac medications before your next appointment, please call your pharmacy*   Lab Work: None ordered   If you have labs (blood work) drawn today and your tests  are completely normal, you will receive your results only by: MyChart Message (if you have MyChart) OR A paper copy in the mail If you have any lab test that is abnormal or we need to change your treatment, we will call you to review the results.   Testing/Procedures: A zio monitor was ordered today. It will remain on for 14 days. You will then return monitor and event diary in provided box. It takes 1-2 weeks for report to be downloaded and returned to [place]. We will call you with the results. If monitor falls off or has orange flashing light, please call Zio for further  instructions.     Follow-Up: Eat 2 packets of saltine crackers a day and increase your fluid intake   At [facility], you and your health needs are our priority.  As part of our continuing mission to provide you with exceptional heart care, we have created designated Provider Care Teams.  These Care Teams include your primary Cardiologist (physician) and Advanced Practice Providers (APPs -  Physician Assistants and Nurse Practitioners) who all work together to provide you with the care you need, when you need it.  We recommend signing up for the patient portal called "MyChart".  Sign up information is provided on this After Visit Summary.  MyChart is used to connect with patients for Virtual Visits (Telemedicine).  Patients are able to view lab/test results, encounter notes, upcoming appointments, etc.  Non-urgent messages can be sent to your provider as well.   To learn more about what you can do with MyChart, go to [email or web address].    Your next appointment:   12 week(s)  Provider:   Thomasene Ripple, DO     Other Instructions ZIO XT- Long Term Monitor Instructions  Your physician has requested you wear a ZIO patch monitor for 14 days.  This is a single patch monitor. [employer] supplies one patch monitor per enrollment. Additional stickers are not available. Please do not apply patch if you will be having a Nuclear Stress Test,  Echocardiogram, Cardiac CT, MRI, or Chest Xray during the period you would be wearing the  monitor. The patch cannot be worn during these tests. You cannot remove and re-apply the  ZIO XT patch monitor.  Your ZIO patch monitor will be mailed 3 day USPS to your address on file. It may take 3-5 days  to receive your monitor after you have been enrolled.  Once you have received your monitor, please review the enclosed instructions. Your monitor  has already been registered assigning a specific monitor serial # to you.  Billing and Patient Assistance  Program Information  We have supplied [employer] with any of your insurance information on file for billing purposes. [employer] offers a sliding scale Patient Assistance Program for patients that do not have  insurance, or whose insurance does not completely cover the cost of the ZIO monitor.  You must apply for the Patient Assistance Program to qualify for this discounted rate.  To apply, please call [employer] at [phone], select option 4, select option 2, ask to apply for  Patient Assistance Program. Meredeth Ide will ask your household income, and how many people  are in your household. They will quote your out-of-pocket cost based on that information.  [employer] will also be able to set up a 44-month, interest-free payment plan if needed.  Applying the monitor   Shave hair from upper left chest.  Hold abrader disc by orange tab. Rub abrader in 40 strokes over  the upper left chest as  indicated in your monitor instructions.  Clean area with 4 enclosed alcohol pads. Let dry.  Apply patch as indicated in monitor instructions. Patch will be placed under collarbone on left  side of chest with arrow pointing upward.  Rub patch adhesive wings for 2 minutes. Remove white label marked "1". Remove the white  label marked "2". Rub patch adhesive wings for 2 additional minutes.  While looking in a mirror, press and release button in center of patch. A small green light will  flash 3-4 times. This will be your only indicator that the monitor has been turned on.  Do not shower for the first 24 hours. You may shower after the first 24 hours.  Press the button if you feel a symptom. You will hear a small click. Record Date, Time and  Symptom in the Patient Logbook.  When you are ready to remove the patch, follow instructions on the last 2 pages of Patient  Logbook. Stick patch monitor onto the last page of Patient Logbook.  Place Patient Logbook in the blue and white box. Use locking tab on box and tape box  closed  securely. The blue and white box has prepaid postage on it. Please place it in the mailbox as  soon as possible. Your physician should have your test results approximately 7 days after the  monitor has been mailed back to [facility]  Call [facility] Customer Care at [phone] if you have questions regarding  your ZIO XT patch monitor. Call them immediately if you see an orange light blinking on your  monitor.  If your monitor falls off in less than 4 days, contact our Monitor department at [phone].  If your monitor becomes loose or falls off after 4 days call [employer] at [phone] for  suggestions on securing your monitor     Dispo:  No follow-ups on file.   Medication Adjustments/Labs and Tests Ordered: Current medicines are reviewed at length with the patient today.  Concerns regarding medicines are outlined above.  Tests Ordered: Orders Placed This Encounter  Procedures   LONG TERM MONITOR (3-14 DAYS)   EKG 12-Lead   EKG 12-Lead   Medication Changes: No orders of the defined types were placed in this encounter.

## 2022-11-12 NOTE — Patient Instructions (Signed)
Medication Instructions:  Your physician recommends that you continue on your current medications as directed. Please refer to the Current Medication list given to you today.   *If you need a refill on your cardiac medications before your next appointment, please call your pharmacy*   Lab Work: None ordered   If you have labs (blood work) drawn today and your tests are completely normal, you will receive your results only by: MyChart Message (if you have MyChart) OR A paper copy in the mail If you have any lab test that is abnormal or we need to change your treatment, we will call you to review the results.   Testing/Procedures: A zio monitor was ordered today. It will remain on for 14 days. You will then return monitor and event diary in provided box. It takes 1-2 weeks for report to be downloaded and returned to [place]. We will call you with the results. If monitor falls off or has orange flashing light, please call Zio for further instructions.     Follow-Up: Eat 2 packets of saltine crackers a day and increase your fluid intake   At [facility], you and your health needs are our priority.  As part of our continuing mission to provide you with exceptional heart care, we have created designated Provider Care Teams.  These Care Teams include your primary Cardiologist (physician) and Advanced Practice Providers (APPs -  Physician Assistants and Nurse Practitioners) who all work together to provide you with the care you need, when you need it.  We recommend signing up for the patient portal called "MyChart".  Sign up information is provided on this After Visit Summary.  MyChart is used to connect with patients for Virtual Visits (Telemedicine).  Patients are able to view lab/test results, encounter notes, upcoming appointments, etc.  Non-urgent messages can be sent to your provider as well.   To learn more about what you can do with MyChart, go to [email or web address].    Your  next appointment:   12 week(s)  Provider:   Thomasene Ripple, DO     Other Instructions ZIO XT- Long Term Monitor Instructions  Your physician has requested you wear a ZIO patch monitor for 14 days.  This is a single patch monitor. [employer] supplies one patch monitor per enrollment. Additional stickers are not available. Please do not apply patch if you will be having a Nuclear Stress Test,  Echocardiogram, Cardiac CT, MRI, or Chest Xray during the period you would be wearing the  monitor. The patch cannot be worn during these tests. You cannot remove and re-apply the  ZIO XT patch monitor.  Your ZIO patch monitor will be mailed 3 day USPS to your address on file. It may take 3-5 days  to receive your monitor after you have been enrolled.  Once you have received your monitor, please review the enclosed instructions. Your monitor  has already been registered assigning a specific monitor serial # to you.  Billing and Patient Assistance Program Information  We have supplied [employer] with any of your insurance information on file for billing purposes. [employer] offers a sliding scale Patient Assistance Program for patients that do not have  insurance, or whose insurance does not completely cover the cost of the ZIO monitor.  You must apply for the Patient Assistance Program to qualify for this discounted rate.  To apply, please call [employer] at [phone], select option 4, select option 2, ask to apply for  Patient Assistance Program. Meredeth Ide will ask  your household income, and how many people  are in your household. They will quote your out-of-pocket cost based on that information.  [employer] will also be able to set up a 75-month, interest-free payment plan if needed.  Applying the monitor   Shave hair from upper left chest.  Hold abrader disc by orange tab. Rub abrader in 40 strokes over the upper left chest as  indicated in your monitor instructions.  Clean area with 4 enclosed alcohol  pads. Let dry.  Apply patch as indicated in monitor instructions. Patch will be placed under collarbone on left  side of chest with arrow pointing upward.  Rub patch adhesive wings for 2 minutes. Remove white label marked "1". Remove the white  label marked "2". Rub patch adhesive wings for 2 additional minutes.  While looking in a mirror, press and release button in center of patch. A small green light will  flash 3-4 times. This will be your only indicator that the monitor has been turned on.  Do not shower for the first 24 hours. You may shower after the first 24 hours.  Press the button if you feel a symptom. You will hear a small click. Record Date, Time and  Symptom in the Patient Logbook.  When you are ready to remove the patch, follow instructions on the last 2 pages of Patient  Logbook. Stick patch monitor onto the last page of Patient Logbook.  Place Patient Logbook in the blue and white box. Use locking tab on box and tape box closed  securely. The blue and white box has prepaid postage on it. Please place it in the mailbox as  soon as possible. Your physician should have your test results approximately 7 days after the  monitor has been mailed back to [facility].  Call [facility] Customer Care at [phone] if you have questions regarding  your ZIO XT patch monitor. Call them immediately if you see an orange light blinking on your  monitor.  If your monitor falls off in less than 4 days, contact our Monitor department at [phone].  If your monitor becomes loose or falls off after 4 days call [employer] at [phone] for  suggestions on securing your monitor

## 2022-11-14 ENCOUNTER — Encounter: Payer: Self-pay | Admitting: Cardiology

## 2022-11-14 DIAGNOSIS — Z3A11 11 weeks gestation of pregnancy: Secondary | ICD-10-CM | POA: Insufficient documentation

## 2022-11-15 DIAGNOSIS — R55 Syncope and collapse: Secondary | ICD-10-CM | POA: Diagnosis not present

## 2022-11-16 ENCOUNTER — Encounter: Payer: Self-pay | Admitting: Cardiology

## 2022-11-18 ENCOUNTER — Encounter: Payer: Self-pay | Admitting: Family Medicine

## 2022-11-18 ENCOUNTER — Other Ambulatory Visit (HOSPITAL_COMMUNITY)
Admission: RE | Admit: 2022-11-18 | Discharge: 2022-11-18 | Disposition: A | Discharge status: Home / Self Care | Payer: Medicaid Other | Source: Ambulatory Visit | Attending: Obstetrics and Gynecology | Admitting: Obstetrics and Gynecology

## 2022-11-18 ENCOUNTER — Ambulatory Visit (INDEPENDENT_AMBULATORY_CARE_PROVIDER_SITE_OTHER): Payer: Medicaid Other | Admitting: Family Medicine

## 2022-11-18 ENCOUNTER — Encounter: Payer: Medicaid Other | Admitting: Family Medicine

## 2022-11-18 VITALS — BP 123/80 | HR 82 | Wt 182.0 lb

## 2022-11-18 DIAGNOSIS — E063 Autoimmune thyroiditis: Secondary | ICD-10-CM

## 2022-11-18 DIAGNOSIS — R55 Syncope and collapse: Secondary | ICD-10-CM

## 2022-11-18 DIAGNOSIS — Z3A12 12 weeks gestation of pregnancy: Secondary | ICD-10-CM | POA: Diagnosis not present

## 2022-11-18 DIAGNOSIS — F32A Depression, unspecified: Secondary | ICD-10-CM | POA: Diagnosis not present

## 2022-11-18 DIAGNOSIS — Z8279 Family history of other congenital malformations, deformations and chromosomal abnormalities: Secondary | ICD-10-CM | POA: Diagnosis not present

## 2022-11-18 DIAGNOSIS — Z1339 Encounter for screening examination for other mental health and behavioral disorders: Secondary | ICD-10-CM

## 2022-11-18 DIAGNOSIS — J4520 Mild intermittent asthma, uncomplicated: Secondary | ICD-10-CM | POA: Insufficient documentation

## 2022-11-18 DIAGNOSIS — F419 Anxiety disorder, unspecified: Secondary | ICD-10-CM

## 2022-11-18 DIAGNOSIS — Z124 Encounter for screening for malignant neoplasm of cervix: Secondary | ICD-10-CM | POA: Diagnosis not present

## 2022-11-18 DIAGNOSIS — Z3401 Encounter for supervision of normal first pregnancy, first trimester: Secondary | ICD-10-CM | POA: Diagnosis not present

## 2022-11-18 DIAGNOSIS — Z3481 Encounter for supervision of other normal pregnancy, first trimester: Secondary | ICD-10-CM

## 2022-11-18 MED ORDER — VENTOLIN HFA 108 (90 BASE) MCG/ACT IN AERS: 2.0000 | INHALATION_SPRAY | RESPIRATORY_TRACT | 0 refills | Status: DC | PRN

## 2022-11-18 NOTE — Progress Notes (Signed)
NOB  Needs pap. OB labs already drawn.  Genetic Screening  Pt had OB/Cardio visit on 11/12/22 for palpations pt was given heart monitor.  CC: swelling in hands when walking swelling in feet with walking.

## 2022-11-18 NOTE — Progress Notes (Signed)
Subjective:   Joan Kim is a 22 y.o. G2P0010 at [redacted]w[redacted]d by early ultrasound being seen today for her first obstetrical visit.  Her obstetrical history is significant for  SAB x 1 . Patient does not intend to breast feed. Pregnancy history fully reviewed.  Patient reports nausea.  HISTORY: OB History  Gravida Para Term Preterm AB Living  2 0 0 0 1 0  SAB IAB Ectopic Multiple Live Births  1 0 0 0 0    # Outcome Date GA Lbr Len/2nd Weight Sex Type Anes PTL Lv  2 Current           1 SAB 07/2022            Past Medical History:  Diagnosis Date   Asthma    Bacterial vaginitis    Fatigue 08/12/2020   GERD (gastroesophageal reflux disease)    H/O bladder infections    Helicobacter pylori antibody positive 03/2019   Insomnia 09/18/2020   Past Surgical History:  Procedure Laterality Date   NO PAST SURGERIES     Family History  Problem Relation Age of Onset   Emphysema Maternal Uncle    Hypertension Maternal Grandmother    Diabetes Paternal Grandmother    Hypertension Paternal Grandfather    Heart disease Paternal Grandfather    Bladder Cancer Paternal Grandfather    Irritable bowel syndrome Other    Colon cancer Neg Hx    Esophageal cancer Neg Hx    Rectal cancer Neg Hx    Stomach cancer Neg Hx    Social History   Tobacco Use   Smoking status: Never    Passive exposure: Yes   Smokeless tobacco: Never  Vaping Use   Vaping status: Never Used  Substance Use Topics   Alcohol use: Not Currently   Drug use: Never   Allergies  Allergen Reactions   Latex Rash   Current Outpatient Medications on File Prior to Visit  Medication Sig Dispense Refill   busPIRone (BUSPAR) 5 MG tablet Take 5 mg by mouth 3 (three) times daily.     levothyroxine (SYNTHROID) 50 MCG tablet Take 50 mcg by mouth every morning.     Prenatal Multivit-Min-Fe-FA (PRENATAL/IRON) TABS Take 1 tablet by mouth daily. 90 tablet 3   promethazine (PHENERGAN) 12.5 MG tablet Take 1 tablet (12.5 mg  total) by mouth every 6 (six) hours as needed for nausea or vomiting. 30 tablet 1   sertraline (ZOLOFT) 100 MG tablet Take 1 tablet (100 mg total) by mouth daily. 30 tablet 6   acetaminophen (TYLENOL) 325 MG tablet Take 650 mg by mouth every 6 (six) hours as needed. (Patient not taking: Reported on 11/12/2022)     ADVAIR DISKUS 250-50 MCG/DOSE AEPB INHALE 1 PUFF INTO THE LUNGS IN THE MORNING AND AT BEDTIME. (Patient not taking: Reported on 11/12/2022) 60 each 6   fluticasone (FLONASE) 50 MCG/ACT nasal spray Place 2 sprays into both nostrils daily. (Patient not taking: Reported on 11/12/2022) 16 g 0   Spacer/Aero-Holding Chambers (AEROCHAMBER MV) inhaler Use as instructed (Patient not taking: Reported on 11/12/2022) 1 each 1   VITAMIN D PO Take by mouth daily. (Patient not taking: Reported on 11/12/2022)     No current facility-administered medications on file prior to visit.     Exam   Vitals:   11/18/22 0838  BP: 123/80  Pulse: 82  Weight: 182 lb (82.6 kg)   Fetal Heart Rate (bpm): 150  Uterus:  Pelvic Exam: Perineum: no hemorrhoids, normal perineum   Vulva: normal external genitalia, no lesions   Vagina:  normal mucosa, normal discharge   Cervix: no lesions and normal, pap smear done.   System: General: well-developed, well-nourished female in no acute distress   Skin: normal coloration and turgor, no rashes   Neurologic: oriented, normal, negative, normal mood   Extremities: normal strength, tone, and muscle mass, ROM of all joints is normal   HEENT extraocular movement intact and sclera clear, anicteric   Mouth/Teeth mucous membranes moist, pharynx normal without lesions and dental hygiene good   Neck supple and no masses   Cardiovascular: regular rate and rhythm   Respiratory:  no respiratory distress, normal breath sounds   Abdomen: soft, non-tender; bowel sounds normal; no masses,  no organomegaly     Assessment:   Pregnancy: G2P0010 Patient Active Problem List    Diagnosis Date Noted   Family history of spina bifida 11/18/2022   Mild intermittent asthma without complication 11/18/2022   Near syncope 11/03/2022   Encounter for supervision of normal first pregnancy in first trimester 10/22/2022   Nausea/vomiting in pregnancy 10/22/2022   Migraine without aura and without status migrainosus, not intractable 04/23/2020   Iron deficiency 12/26/2019   Anxiety and depression 10/04/2019   Hashimoto's thyroiditis 08/30/2019   Vitamin D deficiency 08/30/2019   Dyspepsia 08/30/2019     Plan:  1. Encounter for supervision of normal first pregnancy in first trimester New BO labs reviewed and WNL - HORIZON Basic Panel - PANORAMA PRENATAL TEST  2. Screening for cervical cancer - Cytology - PAP  3. Mild intermittent asthma without complication Refilled her inhaler, discussed when to start preventive if needed  4. Family history of spina bifida On folic acid  5. Anxiety and depression On Buspar  6. Hashimoto's thyroiditis Followed by endocrinology, has f/u with them, recent decrease in replacement to 25 mcg.  7. Near syncope Has seen cardio OB, has Zio monitor on. Has some local skin reaction--advised to take non-sedating anti-histamine.   Initial labs drawn. Continue prenatal vitamins. Genetic Screening discussed, NIPS: ordered. Ultrasound discussed; fetal anatomic survey: ordered. Problem list reviewed and updated.  Routine obstetric precautions reviewed. Return in 4 weeks (on 12/16/2022).

## 2022-11-24 LAB — CYTOLOGY - PAP
Chlamydia: NEGATIVE
Comment: NEGATIVE
Comment: NORMAL
Diagnosis: NEGATIVE
Neisseria Gonorrhea: NEGATIVE

## 2022-11-28 LAB — PANORAMA PRENATAL TEST FULL PANEL:PANORAMA TEST PLUS 5 ADDITIONAL MICRODELETIONS: FETAL FRACTION: 6.9

## 2022-11-30 DIAGNOSIS — R55 Syncope and collapse: Secondary | ICD-10-CM | POA: Diagnosis not present

## 2022-11-30 LAB — HORIZON CUSTOM: REPORT SUMMARY: NEGATIVE

## 2022-12-01 ENCOUNTER — Encounter: Payer: Medicaid Other | Admitting: Family Medicine

## 2022-12-02 DIAGNOSIS — E063 Autoimmune thyroiditis: Secondary | ICD-10-CM | POA: Diagnosis not present

## 2022-12-02 DIAGNOSIS — O99281 Endocrine, nutritional and metabolic diseases complicating pregnancy, first trimester: Secondary | ICD-10-CM | POA: Diagnosis not present

## 2022-12-10 ENCOUNTER — Other Ambulatory Visit: Payer: Self-pay | Admitting: Family Medicine

## 2022-12-15 ENCOUNTER — Ambulatory Visit (INDEPENDENT_AMBULATORY_CARE_PROVIDER_SITE_OTHER): Payer: Medicaid Other | Admitting: Family Medicine

## 2022-12-15 ENCOUNTER — Encounter: Payer: Self-pay | Admitting: Family Medicine

## 2022-12-15 VITALS — BP 108/74 | HR 98 | Wt 181.0 lb

## 2022-12-15 DIAGNOSIS — E063 Autoimmune thyroiditis: Secondary | ICD-10-CM

## 2022-12-15 DIAGNOSIS — O99282 Endocrine, nutritional and metabolic diseases complicating pregnancy, second trimester: Secondary | ICD-10-CM

## 2022-12-15 DIAGNOSIS — F419 Anxiety disorder, unspecified: Secondary | ICD-10-CM

## 2022-12-15 DIAGNOSIS — Z3A16 16 weeks gestation of pregnancy: Secondary | ICD-10-CM

## 2022-12-15 DIAGNOSIS — O99342 Other mental disorders complicating pregnancy, second trimester: Secondary | ICD-10-CM

## 2022-12-15 DIAGNOSIS — F32A Depression, unspecified: Secondary | ICD-10-CM

## 2022-12-15 DIAGNOSIS — Z3401 Encounter for supervision of normal first pregnancy, first trimester: Secondary | ICD-10-CM

## 2022-12-15 DIAGNOSIS — O219 Vomiting of pregnancy, unspecified: Secondary | ICD-10-CM

## 2022-12-15 MED ORDER — TRANSDERM-SCOP 1 MG/3DAYS TD PT72: 1.0000 | MEDICATED_PATCH | TRANSDERMAL | 12 refills | Status: DC

## 2022-12-15 NOTE — Progress Notes (Signed)
    PRENATAL VISIT NOTE  Subjective:  Joan Kim is a 22 y.o. G2P0010 at [redacted]w[redacted]d being seen today for ongoing prenatal care.  She is currently monitored for the following issues for this low-risk pregnancy and has Hashimoto's thyroiditis; Vitamin D deficiency; Dyspepsia; Anxiety and depression; Iron deficiency; Migraine without aura and without status migrainosus, not intractable; Encounter for supervision of normal first pregnancy in first trimester; Nausea/vomiting in pregnancy; Near syncope; Family history of spina bifida; and Mild intermittent asthma without complication on their problem list.  Patient reports  getting married, going on a cruise, wants motion sickness meds. She did about 1 week of zio patch and was told all was normal. Reports some hand and foot swelling.  Contractions: Not present. Vag. Bleeding: None.  Movement: Present. Denies leaking of fluid.   The following portions of the patient's history were reviewed and updated as appropriate: allergies, current medications, past family history, past medical history, past social history, past surgical history and problem list.   Objective:   Vitals:   12/15/22 1444  BP: 108/74  Pulse: 98  Weight: 181 lb (82.1 kg)    Fetal Status: Fetal Heart Rate (bpm): 148   Movement: Present     General:  Alert, oriented and cooperative. Patient is in no acute distress.  Skin: Skin is warm and dry. No rash noted.   Cardiovascular: Normal heart rate noted  Respiratory: Normal respiratory effort, no problems with respiration noted  Abdomen: Soft, gravid, appropriate for gestational age.  Pain/Pressure: Present     Pelvic: Cervical exam deferred        Extremities: Normal range of motion.  Edema: Moderate pitting, indentation subsides rapidly  Mental Status: Normal mood and affect. Normal behavior. Normal judgment and thought content.   Assessment and Plan:  Pregnancy: G2P0010 at [redacted]w[redacted]d 1. Encounter for supervision of normal first  pregnancy in first trimester Has anatomy u/s scheduled  2. Hashimoto's thyroiditis On Synthroid  3. Anxiety and depression On Buspar  4. [redacted] weeks gestation of pregnancy   5. Nausea/vomiting in pregnancy Scope patch given - scopolamine (TRANSDERM-SCOP) 1 MG/3DAYS; Place 1 patch (1.5 mg total) onto the skin every 3 (three) days.  Dispense: 10 patch; Refill: 12  General obstetric precautions including but not limited to vaginal bleeding, contractions, leaking of fluid and fetal movement were reviewed in detail with the patient. Please refer to After Visit Summary for other counseling recommendations.   Return in 4 weeks (on 01/12/2023).  Future Appointments  Date Time Provider Department Center  01/13/2023 11:15 AM Federico Flake, MD [facility]  01/17/2023 12:15 PM [facility] NURSE [facility]  01/17/2023 12:30 PM [facility]  02/04/2023 10:00 AM Tobb, Lavona Mound, DO [facility] None    Reva Bores, MD

## 2022-12-15 NOTE — Progress Notes (Signed)
ROB   CC: excessive swelling in hands, feet and ankles all the time with normal routine.   Wants to discuss Rx for Cruise to help w/ Motion sickness.

## 2022-12-30 DIAGNOSIS — E063 Autoimmune thyroiditis: Secondary | ICD-10-CM | POA: Diagnosis not present

## 2023-01-13 ENCOUNTER — Ambulatory Visit: Payer: Medicaid Other | Admitting: Family Medicine

## 2023-01-13 ENCOUNTER — Ambulatory Visit: Payer: Medicaid Other | Admitting: Cardiology

## 2023-01-13 VITALS — BP 114/73 | HR 87 | Wt 183.0 lb

## 2023-01-13 DIAGNOSIS — E063 Autoimmune thyroiditis: Secondary | ICD-10-CM

## 2023-01-13 DIAGNOSIS — Z3401 Encounter for supervision of normal first pregnancy, first trimester: Secondary | ICD-10-CM

## 2023-01-13 DIAGNOSIS — O99282 Endocrine, nutritional and metabolic diseases complicating pregnancy, second trimester: Secondary | ICD-10-CM

## 2023-01-13 DIAGNOSIS — O219 Vomiting of pregnancy, unspecified: Secondary | ICD-10-CM

## 2023-01-13 DIAGNOSIS — E079 Disorder of thyroid, unspecified: Secondary | ICD-10-CM | POA: Insufficient documentation

## 2023-01-13 DIAGNOSIS — Z3A20 20 weeks gestation of pregnancy: Secondary | ICD-10-CM

## 2023-01-13 DIAGNOSIS — F32A Depression, unspecified: Secondary | ICD-10-CM

## 2023-01-13 DIAGNOSIS — Z8279 Family history of other congenital malformations, deformations and chromosomal abnormalities: Secondary | ICD-10-CM

## 2023-01-13 DIAGNOSIS — F419 Anxiety disorder, unspecified: Secondary | ICD-10-CM

## 2023-01-13 MED ORDER — FAMOTIDINE 20 MG PO TABS
20.0000 mg | ORAL_TABLET | Freq: Two times a day (BID) | ORAL | 6 refills | Status: DC
Start: 2023-01-13 — End: 2023-08-10

## 2023-01-13 NOTE — Progress Notes (Signed)
   PRENATAL VISIT NOTE  Subjective:  Joan Kim is a 22 y.o. G2P0010 at [redacted]w[redacted]d being seen today for ongoing prenatal care.  She is currently monitored for the following issues for this low-risk pregnancy and has Hashimoto's thyroiditis; Vitamin D deficiency; Anxiety and depression; Iron deficiency; Migraine without aura and without status migrainosus, not intractable; Encounter for supervision of normal first pregnancy in first trimester; Nausea/vomiting in pregnancy; Near syncope; Family history of spina bifida; Mild intermittent asthma without complication; and Thyroid dysfunction in pregnancy, antepartum on their problem list.  Patient reports no complaints.  Contractions: Not present. Vag. Bleeding: None.  Movement: Present. Denies leaking of fluid.   The following portions of the patient's history were reviewed and updated as appropriate: allergies, current medications, past family history, past medical history, past social history, past surgical history and problem list.   Objective:   Vitals:   01/13/23 1102  BP: 114/73  Pulse: 87  Weight: 183 lb (83 kg)    Fetal Status: Fetal Heart Rate (bpm): 142 Fundal Height: 20 cm Movement: Present     General:  Alert, oriented and cooperative. Patient is in no acute distress.  Skin: Skin is warm and dry. No rash noted.   Cardiovascular: Normal heart rate noted  Respiratory: Normal respiratory effort, no problems with respiration noted  Abdomen: Soft, gravid, appropriate for gestational age.  Pain/Pressure: Present     Pelvic: Cervical exam deferred        Extremities: Normal range of motion.  Edema: Moderate pitting, indentation subsides rapidly  Mental Status: Normal mood and affect. Normal behavior. Normal judgment and thought content.   Assessment and Plan:  Pregnancy: G2P0010 at [redacted]w[redacted]d 1. Hashimoto's thyroiditis On levothyroxine  2. Nausea/vomiting in pregnancy Recommended trial of H2 blocker with TUM Has history of GERD  prior to pregnancy She is currently not tolerating her PNV and I think this is mostly GERD related   3. Encounter for supervision of normal first pregnancy in first trimester Up to date Feeling movement Has [place] scheduled Unable to tolerate PNV per above  4. Family history of spina bifida  5. Anxiety and depression Stable  6. Thyroid dysfunction in pregnancy, antepartum On    Preterm labor symptoms and general obstetric precautions including but not limited to vaginal bleeding, contractions, leaking of fluid and fetal movement were reviewed in detail with the patient. Please refer to After Visit Summary for other counseling recommendations.   Return in about 4 weeks (around 02/10/2023) for Routine prenatal care.  Future Appointments  Date Time Provider Department Center  01/17/2023 12:15 PM [facility] NURSE [facility]  01/17/2023 12:30 PM [facility]2 [facility]  02/04/2023 10:00 AM Tobb, Kardie, DO [facility] None  02/10/2023  9:35 AM Anyanwu, Jethro Bastos, MD [facility]  03/10/2023  8:00 AM [facility] LAB [facility]  03/10/2023  8:35 AM Constant, Gigi Gin, MD [facility]    Federico Flake, MD

## 2023-01-13 NOTE — Patient Instructions (Signed)
Take the Pepcid twice as day  With dinner also take a TUMS  We will see how this helps with your nausea and likely acid reflux

## 2023-01-17 ENCOUNTER — Ambulatory Visit: Payer: Medicaid Other | Attending: Family Medicine

## 2023-01-17 ENCOUNTER — Other Ambulatory Visit: Payer: Self-pay | Admitting: *Deleted

## 2023-01-17 ENCOUNTER — Encounter: Payer: Self-pay | Admitting: *Deleted

## 2023-01-17 ENCOUNTER — Ambulatory Visit: Payer: Medicaid Other | Admitting: *Deleted

## 2023-01-17 ENCOUNTER — Other Ambulatory Visit: Payer: Self-pay

## 2023-01-17 VITALS — BP 118/68 | HR 71

## 2023-01-17 DIAGNOSIS — O99282 Endocrine, nutritional and metabolic diseases complicating pregnancy, second trimester: Secondary | ICD-10-CM | POA: Insufficient documentation

## 2023-01-17 DIAGNOSIS — E063 Autoimmune thyroiditis: Secondary | ICD-10-CM | POA: Diagnosis not present

## 2023-01-17 DIAGNOSIS — Z3401 Encounter for supervision of normal first pregnancy, first trimester: Secondary | ICD-10-CM | POA: Insufficient documentation

## 2023-01-17 DIAGNOSIS — Z8279 Family history of other congenital malformations, deformations and chromosomal abnormalities: Secondary | ICD-10-CM | POA: Diagnosis not present

## 2023-01-17 DIAGNOSIS — Z3689 Encounter for other specified antenatal screening: Secondary | ICD-10-CM

## 2023-01-17 DIAGNOSIS — Z363 Encounter for antenatal screening for malformations: Secondary | ICD-10-CM | POA: Insufficient documentation

## 2023-01-17 DIAGNOSIS — Z3A20 20 weeks gestation of pregnancy: Secondary | ICD-10-CM | POA: Diagnosis not present

## 2023-01-17 DIAGNOSIS — Z362 Encounter for other antenatal screening follow-up: Secondary | ICD-10-CM

## 2023-01-23 ENCOUNTER — Inpatient Hospital Stay (HOSPITAL_COMMUNITY)
Admission: AD | Admit: 2023-01-23 | Discharge: 2023-01-23 | Disposition: A | Discharge status: Home / Self Care | Payer: Medicaid Other | Attending: Obstetrics & Gynecology | Admitting: Obstetrics & Gynecology

## 2023-01-23 ENCOUNTER — Encounter (HOSPITAL_COMMUNITY): Payer: Self-pay | Admitting: Obstetrics & Gynecology

## 2023-01-23 DIAGNOSIS — O99891 Other specified diseases and conditions complicating pregnancy: Secondary | ICD-10-CM | POA: Diagnosis present

## 2023-01-23 DIAGNOSIS — Z3A21 21 weeks gestation of pregnancy: Secondary | ICD-10-CM

## 2023-01-23 DIAGNOSIS — R350 Frequency of micturition: Secondary | ICD-10-CM | POA: Diagnosis present

## 2023-01-23 DIAGNOSIS — N3943 Post-void dribbling: Secondary | ICD-10-CM | POA: Diagnosis not present

## 2023-01-23 LAB — URINALYSIS, ROUTINE W REFLEX MICROSCOPIC
Bilirubin Urine: NEGATIVE
Glucose, UA: NEGATIVE mg/dL
Hgb urine dipstick: NEGATIVE
Ketones, ur: NEGATIVE mg/dL
Leukocytes,Ua: NEGATIVE
Nitrite: NEGATIVE
Protein, ur: 30 mg/dL — AB
Specific Gravity, Urine: 1.017 (ref 1.005–1.030)
pH: 9 — ABNORMAL HIGH (ref 5.0–8.0)

## 2023-01-23 LAB — POCT FERN TEST: POCT Fern Test: NEGATIVE

## 2023-01-23 NOTE — MAU Provider Note (Signed)
History     CSN: [id number]  Arrival date and time: 01/23/23 1756   Event Date/Time   First Provider Initiated Contact with Patient 01/23/23 1838      Chief Complaint  Patient presents with   Vaginal Discharge   Rupture of Membranes   Urinary Frequency   HPI  Joan Kim is a 22 y.o. G2P0010 at [redacted]w[redacted]d who presents for evaluation of yellow discharge and pressure. Patient reports she is having yellow discharge leaking out throughout the day. She is unsure if it is urine or not. She reports intermittent pressure in her pelvis as well. She denies any pain.  She denies any vaginal bleeding. Denies any constipation, diarrhea or any urinary complaints. Reports normal fetal movement.   OB History     Gravida  2   Para  0   Term  0   Preterm  0   AB  1   Living  0      SAB  1   IAB  0   Ectopic  0   Multiple  0   Live Births  0           Past Medical History:  Diagnosis Date   Asthma    Bacterial vaginitis    Dyspepsia 08/30/2019   Fatigue 08/12/2020   GERD (gastroesophageal reflux disease)    H/O bladder infections    Helicobacter pylori antibody positive 03/2019   Insomnia 09/18/2020    Past Surgical History:  Procedure Laterality Date   NO PAST SURGERIES      Family History  Problem Relation Age of Onset   Emphysema Maternal Uncle    Hypertension Maternal Grandmother    Diabetes Paternal Grandmother    Hypertension Paternal Grandfather    Heart disease Paternal Grandfather    Bladder Cancer Paternal Grandfather    Irritable bowel syndrome Other    Colon cancer Neg Hx    Esophageal cancer Neg Hx    Rectal cancer Neg Hx    Stomach cancer Neg Hx     Social History   Tobacco Use   Smoking status: Never    Passive exposure: Yes   Smokeless tobacco: Never  Vaping Use   Vaping status: Never Used  Substance Use Topics   Alcohol use: Not Currently   Drug use: Never    Allergies:  Allergies  Allergen Reactions   Latex Rash     Medications Prior to Admission  Medication Sig Dispense Refill Last Dose   levothyroxine (SYNTHROID) 50 MCG tablet Take 50 mcg by mouth every morning.   01/23/2023   acetaminophen (TYLENOL) 325 MG tablet Take 650 mg by mouth every 6 (six) hours as needed. (Patient not taking: Reported on 11/12/2022)      ADVAIR DISKUS 250-50 MCG/DOSE AEPB INHALE 1 PUFF INTO THE LUNGS IN THE MORNING AND AT BEDTIME. (Patient not taking: Reported on 11/12/2022) 60 each 6    albuterol (VENTOLIN HFA) 108 (90 Base) MCG/ACT inhaler INHALE 2 PUFFS INTO THE LUNGS EVERY 4 HOURS AS NEEDED FOR WHEEZING OR SHORTNESS OF BREATH. 18 each 3    busPIRone (BUSPAR) 5 MG tablet Take 5 mg by mouth 3 (three) times daily.      famotidine (PEPCID) 20 MG tablet Take 1 tablet (20 mg total) by mouth 2 (two) times daily. (Patient not taking: Reported on 01/17/2023) 60 tablet 6    fluticasone (FLONASE) 50 MCG/ACT nasal spray Place 2 sprays into both nostrils daily. (Patient not taking: Reported on 11/12/2022)  16 g 0    Prenatal Multivit-Min-Fe-FA (PRENATAL/IRON) TABS Take 1 tablet by mouth daily. (Patient not taking: Reported on 01/13/2023) 90 tablet 3    promethazine (PHENERGAN) 12.5 MG tablet Take 1 tablet (12.5 mg total) by mouth every 6 (six) hours as needed for nausea or vomiting. 30 tablet 1    scopolamine (TRANSDERM-SCOP) 1 MG/3DAYS Place 1 patch (1.5 mg total) onto the skin every 3 (three) days. (Patient not taking: Reported on 01/13/2023) 10 patch 12    sertraline (ZOLOFT) 100 MG tablet Take 1 tablet (100 mg total) by mouth daily. 30 tablet 6    Spacer/Aero-Holding Chambers (AEROCHAMBER MV) inhaler Use as instructed (Patient not taking: Reported on 11/12/2022) 1 each 1    VITAMIN D PO Take by mouth daily. (Patient not taking: Reported on 11/12/2022)       Review of Systems  Constitutional: Negative.  Negative for fatigue and fever.  HENT: Negative.    Respiratory: Negative.  Negative for shortness of breath.   Cardiovascular:  Negative.  Negative for chest pain.  Gastrointestinal: Negative.  Negative for abdominal pain, constipation, diarrhea, nausea and vomiting.  Genitourinary:  Positive for vaginal discharge. Negative for dysuria and vaginal bleeding.  Neurological: Negative.  Negative for dizziness and headaches.   Physical Exam   Blood pressure 118/73, pulse 76, temperature 97.9 F (36.6 C), temperature source Oral, resp. rate 14, height 5\' 6"  (1.676 m), weight 83.8 kg, last menstrual period 08/19/2022, SpO2 99%, unknown if currently breastfeeding.  Patient Vitals for the past 24 hrs:  BP Temp Temp src Pulse Resp SpO2 Height Weight  01/23/23 1814 118/73 97.9 F (36.6 C) Oral 76 14 99 % 5\' 6"  (1.676 m) 83.8 kg    Physical Exam Vitals and nursing note reviewed.  Constitutional:      General: She is not in acute distress.    Appearance: She is well-developed.  HENT:     Head: Normocephalic.  Eyes:     Pupils: Pupils are equal, round, and reactive to light.  Cardiovascular:     Rate and Rhythm: Normal rate and regular rhythm.     Heart sounds: Normal heart sounds.  Pulmonary:     Effort: Pulmonary effort is normal. No respiratory distress.     Breath sounds: Normal breath sounds.  Abdominal:     General: Bowel sounds are normal. There is no distension.     Palpations: Abdomen is soft.     Tenderness: There is no abdominal tenderness.  Genitourinary:    Comments: Pelvic exam: Cervix pink, visually closed, without lesion, scant white creamy discharge, vaginal walls and external genitalia normal Bimanual exam: Cervix 0/long/high, firm, anterior, neg CMT, uterus nontender, nonenlarged, adnexa without tenderness, enlargement, or mass   Skin:    General: Skin is warm and dry.  Neurological:     Mental Status: She is alert and oriented to person, place, and time.  Psychiatric:        Mood and Affect: Mood normal.        Behavior: Behavior normal.        Thought Content: Thought content normal.         Judgment: Judgment normal.     FHT: 144 bpm   MAU Course  Procedures  Results for orders placed or performed during the hospital encounter of 01/23/23 (from the past 24 hour(s))  Fern Test     Status: None   Collection Time: 01/23/23  6:19 PM  Result Value Ref Range   POCT  Fern Test Negative = intact amniotic membranes   Urinalysis, Routine w reflex microscopic -Urine, Clean Catch     Status: Abnormal   Collection Time: 01/23/23  6:19 PM  Result Value Ref Range   Color, Urine YELLOW YELLOW   APPearance HAZY (A) CLEAR   Specific Gravity, Urine 1.017 1.005 - 1.030   pH 9.0 (H) 5.0 - 8.0   Glucose, UA NEGATIVE NEGATIVE mg/dL   Hgb urine dipstick NEGATIVE NEGATIVE   Bilirubin Urine NEGATIVE NEGATIVE   Ketones, ur NEGATIVE NEGATIVE mg/dL   Protein, ur 30 (A) NEGATIVE mg/dL   Nitrite NEGATIVE NEGATIVE   Leukocytes,Ua NEGATIVE NEGATIVE   RBC / HPF 0-5 0 - 5 RBC/hpf   WBC, UA 0-5 0 - 5 WBC/hpf   Bacteria, UA RARE (A) NONE SEEN   Squamous Epithelial / HPF 6-10 0 - 5 /HPF    MDM Labs ordered and reviewed.   UA No signs of PPROM and after further discussion, patient feels like she is leaking urine  Assessment and Plan   1. Post-void dribbling   2. [redacted] weeks gestation of pregnancy     -Discharge home in stable condition -Second trimester precautions discussed -Patient advised to follow-up with OB as scheduled for prenatal care -Patient may return to MAU as needed or if her condition were to change or worsen  Rolm Bookbinder, CNM 01/23/2023, 7:14 PM

## 2023-01-23 NOTE — Discharge Instructions (Signed)

## 2023-01-23 NOTE — MAU Note (Signed)
.  Joan Kim is a 22 y.o. at [redacted]w[redacted]d here in MAU reporting: Two hours ago she began experiencing lower abdominal pressure that is not painful as well as uncontrollable leaking of fluids. She reports the fluids are clear to yellow and smells like "dog piss." She reports she may be urinating on herself. She reports her urine has a strong odor. She reports being prone to UTI's as she had several as a child. Denies VB. Reports flutters. Denies recent IC. Denies vaginal itching.  Onset of complaint: Two hours ago Pain score: Denies pain.  FHT: 144 doppler Lab orders placed from triage:  UA, Crist Fat

## 2023-01-24 ENCOUNTER — Encounter: Payer: Self-pay | Admitting: Family Medicine

## 2023-01-27 DIAGNOSIS — Z3A09 9 weeks gestation of pregnancy: Secondary | ICD-10-CM | POA: Diagnosis not present

## 2023-01-27 DIAGNOSIS — R7989 Other specified abnormal findings of blood chemistry: Secondary | ICD-10-CM | POA: Diagnosis not present

## 2023-01-27 DIAGNOSIS — R946 Abnormal results of thyroid function studies: Secondary | ICD-10-CM | POA: Diagnosis not present

## 2023-02-04 ENCOUNTER — Ambulatory Visit: Payer: Medicaid Other | Attending: Cardiology | Admitting: Cardiology

## 2023-02-04 ENCOUNTER — Encounter: Payer: Self-pay | Admitting: Cardiology

## 2023-02-04 VITALS — BP 106/62 | HR 83 | Ht 66.0 in | Wt 184.4 lb

## 2023-02-04 DIAGNOSIS — O219 Vomiting of pregnancy, unspecified: Secondary | ICD-10-CM

## 2023-02-04 DIAGNOSIS — Z3401 Encounter for supervision of normal first pregnancy, first trimester: Secondary | ICD-10-CM | POA: Diagnosis not present

## 2023-02-04 DIAGNOSIS — O26812 Pregnancy related exhaustion and fatigue, second trimester: Secondary | ICD-10-CM

## 2023-02-04 DIAGNOSIS — O228X3 Other venous complications in pregnancy, third trimester: Secondary | ICD-10-CM | POA: Diagnosis not present

## 2023-02-04 DIAGNOSIS — Z3A23 23 weeks gestation of pregnancy: Secondary | ICD-10-CM

## 2023-02-04 NOTE — Patient Instructions (Signed)
Medication Instructions:  Your physician recommends that you continue on your current medications as directed. Please refer to the Current Medication list given to you today.  *If you need a refill on your cardiac medications before your next appointment, please call your pharmacy*   Lab Work: None   Testing/Procedures: None   Follow-Up: At [facility], you and your health needs are our priority.  As part of our continuing mission to provide you with exceptional heart care, we have created designated Provider Care Teams.  These Care Teams include your primary Cardiologist (physician) and Advanced Practice Providers (APPs -  Physician Assistants and Nurse Practitioners) who all work together to provide you with the care you need, when you need it.   Your next appointment:    Jan 9th at 10:00 am  Provider:   Thomasene Ripple, DO

## 2023-02-05 NOTE — Progress Notes (Signed)
Cardio-Obstetrics Clinic  Follow Up Note   Date:  02/05/2023   ID:  ZIGGY BERL, DOB 30-Mar-2000, MRN [id number]  PCP:  Reva Bores, MD   [facility] Providers Cardiologist:  Thomasene Ripple, DO  Electrophysiologist:  None        Referring MD: Ivery Quale, MD   Chief Complaint: " I am ok"   History of Present Illness:    Joan Kim is a 22 y.o. female [G2P0010] who returns for follow up.   She is  a 23-week pregnant patient, presents with ongoing sickness into her second trimester. Despite this, she reports that the frequency of her fainting spells has decreased since her last visit. She attributes this improvement to a possible shift in her baby's position, which was previously causing IVC compression. She has been prescribed medication to manage her sickness, the effectiveness of which is yet to be determined. Joan Kim has also recently stopped her thyroid medication. She had been wearing a monitor, which caused a significant rash and itching, leading her to remove it after four days.   Prior CV Studies Reviewed: The following studies were reviewed today: Reviewed echo and monitor   Past Medical History:  Diagnosis Date   Asthma    Bacterial vaginitis    Dyspepsia 08/30/2019   Fatigue 08/12/2020   GERD (gastroesophageal reflux disease)    H/O bladder infections    Helicobacter pylori antibody positive 03/2019   Insomnia 09/18/2020    Past Surgical History:  Procedure Laterality Date   NO PAST SURGERIES        OB History     Gravida  2   Para  0   Term  0   Preterm  0   AB  1   Living  0      SAB  1   IAB  0   Ectopic  0   Multiple  0   Live Births  0               Current Medications: Current Meds  Medication Sig   acetaminophen (TYLENOL) 325 MG tablet Take 650 mg by mouth every 6 (six) hours as needed.   ADVAIR DISKUS 250-50 MCG/DOSE AEPB INHALE 1 PUFF INTO THE LUNGS IN THE MORNING AND AT BEDTIME.   albuterol  (VENTOLIN HFA) 108 (90 Base) MCG/ACT inhaler INHALE 2 PUFFS INTO THE LUNGS EVERY 4 HOURS AS NEEDED FOR WHEEZING OR SHORTNESS OF BREATH.   busPIRone (BUSPAR) 5 MG tablet Take 5 mg by mouth 3 (three) times daily.   Prenatal Multivit-Min-Fe-FA (PRENATAL/IRON) TABS Take 1 tablet by mouth daily.   promethazine (PHENERGAN) 12.5 MG tablet Take 1 tablet (12.5 mg total) by mouth every 6 (six) hours as needed for nausea or vomiting.   scopolamine (TRANSDERM-SCOP) 1 MG/3DAYS Place 1 patch (1.5 mg total) onto the skin every 3 (three) days.   sertraline (ZOLOFT) 100 MG tablet Take 1 tablet (100 mg total) by mouth daily.     Allergies:   Latex   Social History   Socioeconomic History   Marital status: Single    Spouse name: Not on file   Number of children: Not on file   Years of education: Not on file   Highest education level: Not on file  Occupational History   Occupation: Student  Tobacco Use   Smoking status: Never    Passive exposure: Yes   Smokeless tobacco: Never  Vaping Use   Vaping status: Never Used  Substance  and Sexual Activity   Alcohol use: Not Currently   Drug use: Never   Sexual activity: Yes    Partners: Male  Other Topics Concern   Not on file  Social History Narrative   Single lives with her parents splits time between each layer divorced.   Graduate [place] 2020   Working for [employer]   No EtOH, tobacco, caffeine, drugs   Social Determinants of Corporate investment banker Strain: Not on file  Food Insecurity: Not on file  Transportation Needs: No Transportation Needs (11/14/2022)   PRAPARE - Administrator, Civil Service (Medical): No    Lack of Transportation (Non-Medical): No  Physical Activity: Not on file  Stress: Not on file  Social Connections: Not on file      Family History  Problem Relation Age of Onset   Emphysema Maternal Uncle    Hypertension Maternal Grandmother    Diabetes Paternal Grandmother    Hypertension  Paternal Grandfather    Heart disease Paternal Grandfather    Bladder Cancer Paternal Grandfather    Irritable bowel syndrome Other    Colon cancer Neg Hx    Esophageal cancer Neg Hx    Rectal cancer Neg Hx    Stomach cancer Neg Hx       ROS:   Please see the history of present illness.     All other systems reviewed and are negative.   Labs/EKG Reviewed:    EKG:   EKG was not  ordered today.    Recent Labs: 08/18/2022: ALT 15; BUN 6; Creatinine, Ser 0.59; Potassium 3.5; Sodium 136 10/22/2022: Hemoglobin 12.9; Platelets 313   Recent Lipid Panel No results found for: "CHOL", "TRIG", "HDL", "CHOLHDL", "LDLCALC", "LDLDIRECT"  Physical Exam:    VS:  BP 106/62 (BP Location: Right Arm, Patient Position: Sitting, Cuff Size: Normal)   Pulse 83   Ht 5\' 6"  (1.676 m)   Wt 184 lb 6.4 oz (83.6 kg)   LMP 08/19/2022   SpO2 99%   BMI 29.76 kg/m     Wt Readings from Last 3 Encounters:  02/04/23 184 lb 6.4 oz (83.6 kg)  01/23/23 184 lb 11.2 oz (83.8 kg)  01/13/23 183 lb (83 kg)     GEN:  Well nourished, well developed in no acute distress HEENT: Normal NECK: No JVD; No carotid bruits LYMPHATICS: No lymphadenopathy CARDIAC: RRR, no murmurs, rubs, gallops RESPIRATORY:  Clear to auscultation without rales, wheezing or rhonchi  ABDOMEN: Soft, non-tender, non-distended MUSCULOSKELETAL:  No edema; No deformity  SKIN: Warm and dry NEUROLOGIC:  Alert and oriented x 3 PSYCHIATRIC:  Normal affect    Risk Assessment/Risk Calculators:     CARPREG II Risk Prediction Index Score:  1.  The patient's risk for a primary cardiac event is 5%.   Modified World Health Organization [facility]) Classification of Maternal CV Risk   Class I         ASSESSMENT & PLAN:    Pregnancy at 23 weeks Reports of nausea and occasional dizziness. Suspected IVC compression due to baby's position. Blood pressure within normal range. -Continue current medications for nausea. -Encouraged to monitor blood  pressure at home, especially during episodes of dizziness. -Scheduled follow-up in 8 weeks.  Thyroid condition Patient has stopped thyroid medication, presumably due to normalization of thyroid function. -No specific plan discussed, continue monitoring.  General Health Maintenance -Continue regular visits with OB/GYN. -Encouraged to maintain good hydration.  Patient Instructions  Medication Instructions:  Your physician recommends that you continue on your current medications as directed. Please refer to the Current Medication list given to you today.  *If you need a refill on your cardiac medications before your next appointment, please call your pharmacy*   Lab Work: None   Testing/Procedures: None   Follow-Up: At [facility], you and your health needs are our priority.  As part of our continuing mission to provide you with exceptional heart care, we have created designated Provider Care Teams.  These Care Teams include your primary Cardiologist (physician) and Advanced Practice Providers (APPs -  Physician Assistants and Nurse Practitioners) who all work together to provide you with the care you need, when you need it.   Your next appointment:    Jan 9th at 10:00 am  Provider:   Thomasene Ripple, DO     Dispo:  No follow-ups on file.   Medication Adjustments/Labs and Tests Ordered: Current medicines are reviewed at length with the patient today.  Concerns regarding medicines are outlined above.  Tests Ordered: No orders of the defined types were placed in this encounter.  Medication Changes: No orders of the defined types were placed in this encounter.

## 2023-02-07 ENCOUNTER — Encounter: Payer: Self-pay | Admitting: Family Medicine

## 2023-02-07 ENCOUNTER — Encounter: Payer: Self-pay | Admitting: *Deleted

## 2023-02-10 ENCOUNTER — Encounter: Payer: Self-pay | Admitting: Obstetrics & Gynecology

## 2023-02-10 ENCOUNTER — Ambulatory Visit: Payer: Medicaid Other | Admitting: Obstetrics & Gynecology

## 2023-02-10 VITALS — BP 112/73 | HR 84 | Wt 185.0 lb

## 2023-02-10 DIAGNOSIS — E079 Disorder of thyroid, unspecified: Secondary | ICD-10-CM

## 2023-02-10 DIAGNOSIS — Z3A24 24 weeks gestation of pregnancy: Secondary | ICD-10-CM

## 2023-02-10 DIAGNOSIS — O99280 Endocrine, nutritional and metabolic diseases complicating pregnancy, unspecified trimester: Secondary | ICD-10-CM

## 2023-02-10 DIAGNOSIS — Z3482 Encounter for supervision of other normal pregnancy, second trimester: Secondary | ICD-10-CM

## 2023-02-10 NOTE — Patient Instructions (Addendum)
Return to office for any scheduled appointments. Call the office or go to the MAU at [facility] if: You begin to have strong, frequent contractions Your water breaks.  Sometimes it is a big gush of fluid, sometimes it is just a trickle that keeps getting your underwear wet or running down your legs You have vaginal bleeding.  It is normal to have a small amount of spotting if your cervix was checked.  You do not feel your baby moving like normal.  If you do not, get something to eat and drink and lay down and focus on feeling your baby move.   If your baby is still not moving like normal, you should call the office or go to MAU. Any other obstetric concerns.  Oral Glucose Tolerance Test During Pregnancy Why am I having this test? The oral glucose tolerance test (GTT) is done to check how your body processes blood sugar (glucose). This is one of several tests used to diagnose diabetes that develops during pregnancy (gestational diabetes mellitus). Gestational diabetes is a short-term form of diabetes that some women develop while they are pregnant. It usually occurs during the second or third trimester of pregnancy and goes away after delivery. Testing, or screening, for gestational diabetes usually occurs around 28 of pregnancy. This test may also be needed earlier if: You have a history of gestational diabetes. There is a history of giving birth to very large babies or of losing pregnancies (having stillbirths). You have signs and symptoms of diabetes, such as: Changes in your eyesight. Tingling or numbness in your hands or feet. Changes in hunger, thirst, and urination, and these are not explained by your pregnancy. What is being tested? This test measures the amount of glucose in your blood at different times during a period of 2 hours. This shows how well your body can process glucose.  You will have three separate blood draws. What kind of sample is  taken?  Blood samples are required for this test. They are usually collected by inserting a needle into a blood vessel. How do I prepare for this test? For 3 days before your test, eat normally. Have plenty of carbohydrate-rich foods. You will be asked not to eat or drink anything other than water (to fast) starting 8-10 hours before the test. Tell a health care provider about: All medicines you are taking, including vitamins, herbs, eye drops, creams, and over-the-counter medicines. Any blood disorders you have. Any surgeries you have had. Any medical conditions you have. What happens during the test? First, your blood glucose will be measured. This is referred to as your fasting blood glucose because you fasted before the test. Then, you will drink a glucose solution that contains a certain amount of glucose. Your blood glucose will be measured again 1 and 2 hours after you drink the solution. This test takes about 2 hours to complete. You will need to stay at the testing location during this time. During the testing period: Do not eat or drink anything other than the glucose solution. Do not exercise. Do not use any products that contain nicotine or tobacco, such as cigarettes, e-cigarettes, and chewing tobacco. These can affect your test results. If you need help quitting, ask your health care provider. The testing procedure may vary among health care providers and hospitals. How are the results reported? Your results will be reported as milligrams of glucose per deciliter of blood (mg/dL) or millimoles per liter (mmol/L). There   is more than one source for screening and diagnosis reference values used to diagnose gestational diabetes. Your health care provider will compare your results to normal values that were established after testing a large group of people (reference values). Reference values may vary among labs and hospitals. For this test, reference values are: Fasting: 92 mg/dL 1  hour: 180 mg/dL  2 hour: 153 mg/dL   What do the results mean? Results below the reference values are considered normal. If one or more of your blood glucose levels are at or above the reference values, you will be diagnosed with gestational diabetes.  Talk with your health care provider about what your results mean. Questions to ask your health care provider Ask your health care provider, or the department that is doing the test: When will my results be ready? How will I get my results? What are my treatment options? What other tests do I need? What are my next steps? Summary The oral glucose tolerance test (GTT) is one of several tests used to diagnose diabetes that develops during pregnancy (gestational diabetes mellitus). Gestational diabetes is a short-term form of diabetes that some women develop while they are pregnant. You may also have this test if you have any symptoms or risk factors for this type of diabetes. Talk with your health care provider about what your results mean. This information is not intended to replace advice given to you by your health care provider. Make sure you discuss any questions you have with your health care provider.   TDaP Vaccine Pregnancy Get the Whooping Cough Vaccine While You Are Pregnant (CDC)  It is important for women to get the whooping cough vaccine in the third trimester of each pregnancy. Vaccines are the best way to prevent this disease. There are 2 different whooping cough vaccines. Both vaccines combine protection against whooping cough, tetanus and diphtheria, but they are for different age groups: Tdap: for everyone 11 years or older, including pregnant women  DTaP: for children 2 months through 6 years of age  You need the whooping cough vaccine during each of your pregnancies The recommended time to get the shot is during your 27th through 36th week of pregnancy, preferably during the earlier part of this time period. The Centers  for Disease Control and Prevention (CDC) recommends that pregnant women receive the whooping cough vaccine for adolescents and adults (called Tdap vaccine) during the third trimester of each pregnancy. The recommended time to get the shot is during your 27th through 36th week of pregnancy, preferably during the earlier part of this time period. This replaces the original recommendation that pregnant women get the vaccine only if they had not previously received it. The American College of Obstetricians and Gynecologists and the American College of Nurse-Midwives support this recommendation.  You should get the whooping cough vaccine while pregnant to pass protection to your baby frame support disabled and/or not supported in this browser  Learn why Joan Kim decided to get the whooping cough vaccine in her 3rd trimester of pregnancy and how her baby girl was born with some protection against the disease. Also available on YouTube. After receiving the whooping cough vaccine, your body will create protective antibodies (proteins produced by the body to fight off diseases) and pass some of them to your baby before birth. These antibodies provide your baby some short-term protection against whooping cough in early life. These antibodies can also protect your baby from some of the more serious complications that come along   with whooping cough. Your protective antibodies are at their highest about 2 weeks after getting the vaccine, but it takes time to pass them to your baby. So the preferred time to get the whooping cough vaccine is early in your third trimester. The amount of whooping cough antibodies in your body decreases over time. That is why CDC recommends you get a whooping cough vaccine during each pregnancy. Doing so allows each of your babies to get the greatest number of protective antibodies from you. This means each of your babies will get the best protection possible against this disease.  Getting  the whooping cough vaccine while pregnant is better than getting the vaccine after you give birth Whooping cough vaccination during pregnancy is ideal so your baby will have short-term protection as soon as he is born. This early protection is important because your baby will not start getting his whooping cough vaccines until he is 2 months old. These first few months of life are when your baby is at greatest risk for catching whooping cough. This is also when he's at greatest risk for having severe, potentially life-threating complications from the infection. To avoid that gap in protection, it is best to get a whooping cough vaccine during pregnancy. You will then pass protection to your baby before he is born. To continue protecting your baby, he should get whooping cough vaccines starting at 2 months old. You may never have gotten the Tdap vaccine before and did not get it during this pregnancy. If so, you should make sure to get the vaccine immediately after you give birth, before leaving the hospital or birthing center. It will take about 2 weeks before your body develops protection (antibodies) in response to the vaccine. Once you have protection from the vaccine, you are less likely to give whooping cough to your newborn while caring for him. But remember, your baby will still be at risk for catching whooping cough from others. A recent study looked to see how effective Tdap was at preventing whooping cough in babies whose mothers got the vaccine while pregnant or in the hospital after giving birth. The study found that getting Tdap between 27 through 36 weeks of pregnancy is 85% more effective at preventing whooping cough in babies younger than 2 months old. Blood tests cannot tell if you need a whooping cough vaccine There are no blood tests that can tell you if you have enough antibodies in your body to protect yourself or your baby against whooping cough. Even if you have been sick with whooping  cough in the past or previously received the vaccine, you still should get the vaccine during each pregnancy. Breastfeeding may pass some protective antibodies onto your baby By breastfeeding, you may pass some antibodies you have made in response to the vaccine to your baby. When you get a whooping cough vaccine during your pregnancy, you will have antibodies in your breast milk that you can share with your baby as soon as your milk comes in. However, your baby will not get protective antibodies immediately if you wait to get the whooping cough vaccine until after delivering your baby. This is because it takes about 2 weeks for your body to create antibodies. Learn more about the health benefits of breastfeeding.  

## 2023-02-10 NOTE — Progress Notes (Signed)
   PRENATAL VISIT NOTE  Subjective:  Joan Kim is a 22 y.o. G2P0010 at [redacted]w[redacted]d being seen today for ongoing prenatal care.  She is currently monitored for the following issues for this high-risk pregnancy and has Hashimoto's thyroiditis; Vitamin D deficiency; Anxiety and depression; Iron deficiency; Migraine without aura and without status migrainosus, not intractable; Encounter for supervision of normal pregnancy; Nausea/vomiting in pregnancy; Near syncope; Family history of spina bifida; Mild intermittent asthma without complication; and Thyroid dysfunction in pregnancy, antepartum on their problem list.  Patient reports no complaints.  Contractions: Irritability. Vag. Bleeding: None.  Movement: Present. Denies leaking of fluid.   The following portions of the patient's history were reviewed and updated as appropriate: allergies, current medications, past family history, past medical history, past social history, past surgical history and problem list.   Objective:   Vitals:   02/10/23 0939  BP: 112/73  Pulse: 84  Weight: 185 lb (83.9 kg)    Fetal Status: Fetal Heart Rate (bpm): 158   Movement: Present     General:  Alert, oriented and cooperative. Patient is in no acute distress.  Skin: Skin is warm and dry. No rash noted.   Cardiovascular: Normal heart rate noted  Respiratory: Normal respiratory effort, no problems with respiration noted  Abdomen: Soft, gravid, appropriate for gestational age.  Pain/Pressure: Absent     Pelvic: Cervical exam deferred        Extremities: Normal range of motion.  Edema: Mild pitting, slight indentation  Mental Status: Normal mood and affect. Normal behavior. Normal judgment and thought content.   Assessment and Plan:  Pregnancy: G2P0010 at [redacted]w[redacted]d 1. Thyroid dysfunction in pregnancy, antepartum No meds as per her endocrinologist, they will follow up.  2. [redacted] weeks gestation of pregnancy 3. Encounter for supervision of other normal pregnancy  in second trimester No other concerns. Discussed expectations for 28 week visit. Preterm labor symptoms and general obstetric precautions including but not limited to vaginal bleeding, contractions, leaking of fluid and fetal movement were reviewed in detail with the patient. Please refer to After Visit Summary for other counseling recommendations.   Return in about 4 weeks (around 03/10/2023) for 2 hr GTT, 3rd trimester labs, TDap, OFFICE OB VISIT (MD only).  Future Appointments  Date Time Provider Department Center  02/17/2023  2:15 PM [facility] NURSE [facility]  02/17/2023  2:30 PM [facility] US1 [facility]  03/10/2023  8:00 AM [facility] LAB [facility]  03/10/2023  8:35 AM Constant, Gigi Gin, MD [facility]  04/07/2023 10:00 AM Tobb, Lavona Mound, DO [facility] None    Jaynie Collins, MD

## 2023-02-15 IMAGING — DX DG KNEE 1-2V*R*
2 series · 2 of 2 positions shown · non-contrast
Comparison: None.

CLINICAL DATA: Bilateral knee pain for 3 months.

EXAM:
LEFT KNEE - 1-2 VIEW; BILATERAL KNEES STANDING - 1 VIEW; RIGHT KNEE
- 1-2 VIEW

[view not recorded]
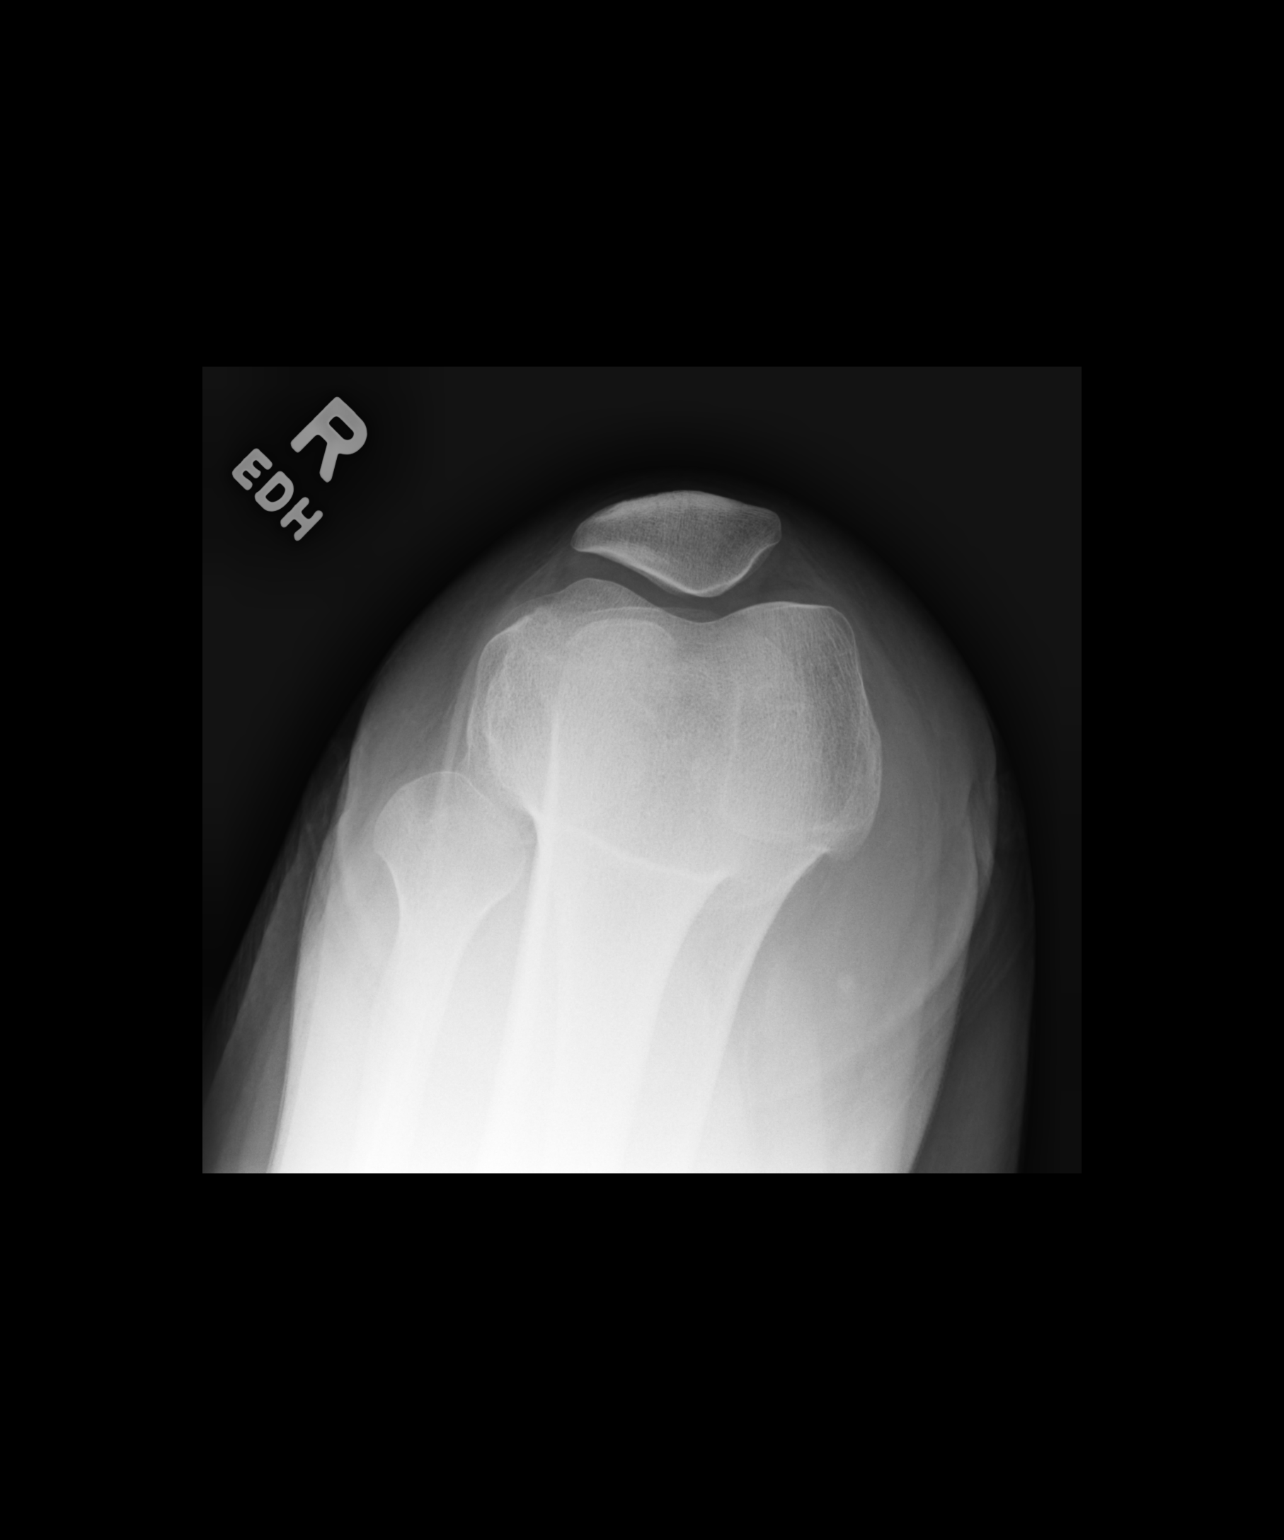

[dg knee 1-2 views right]
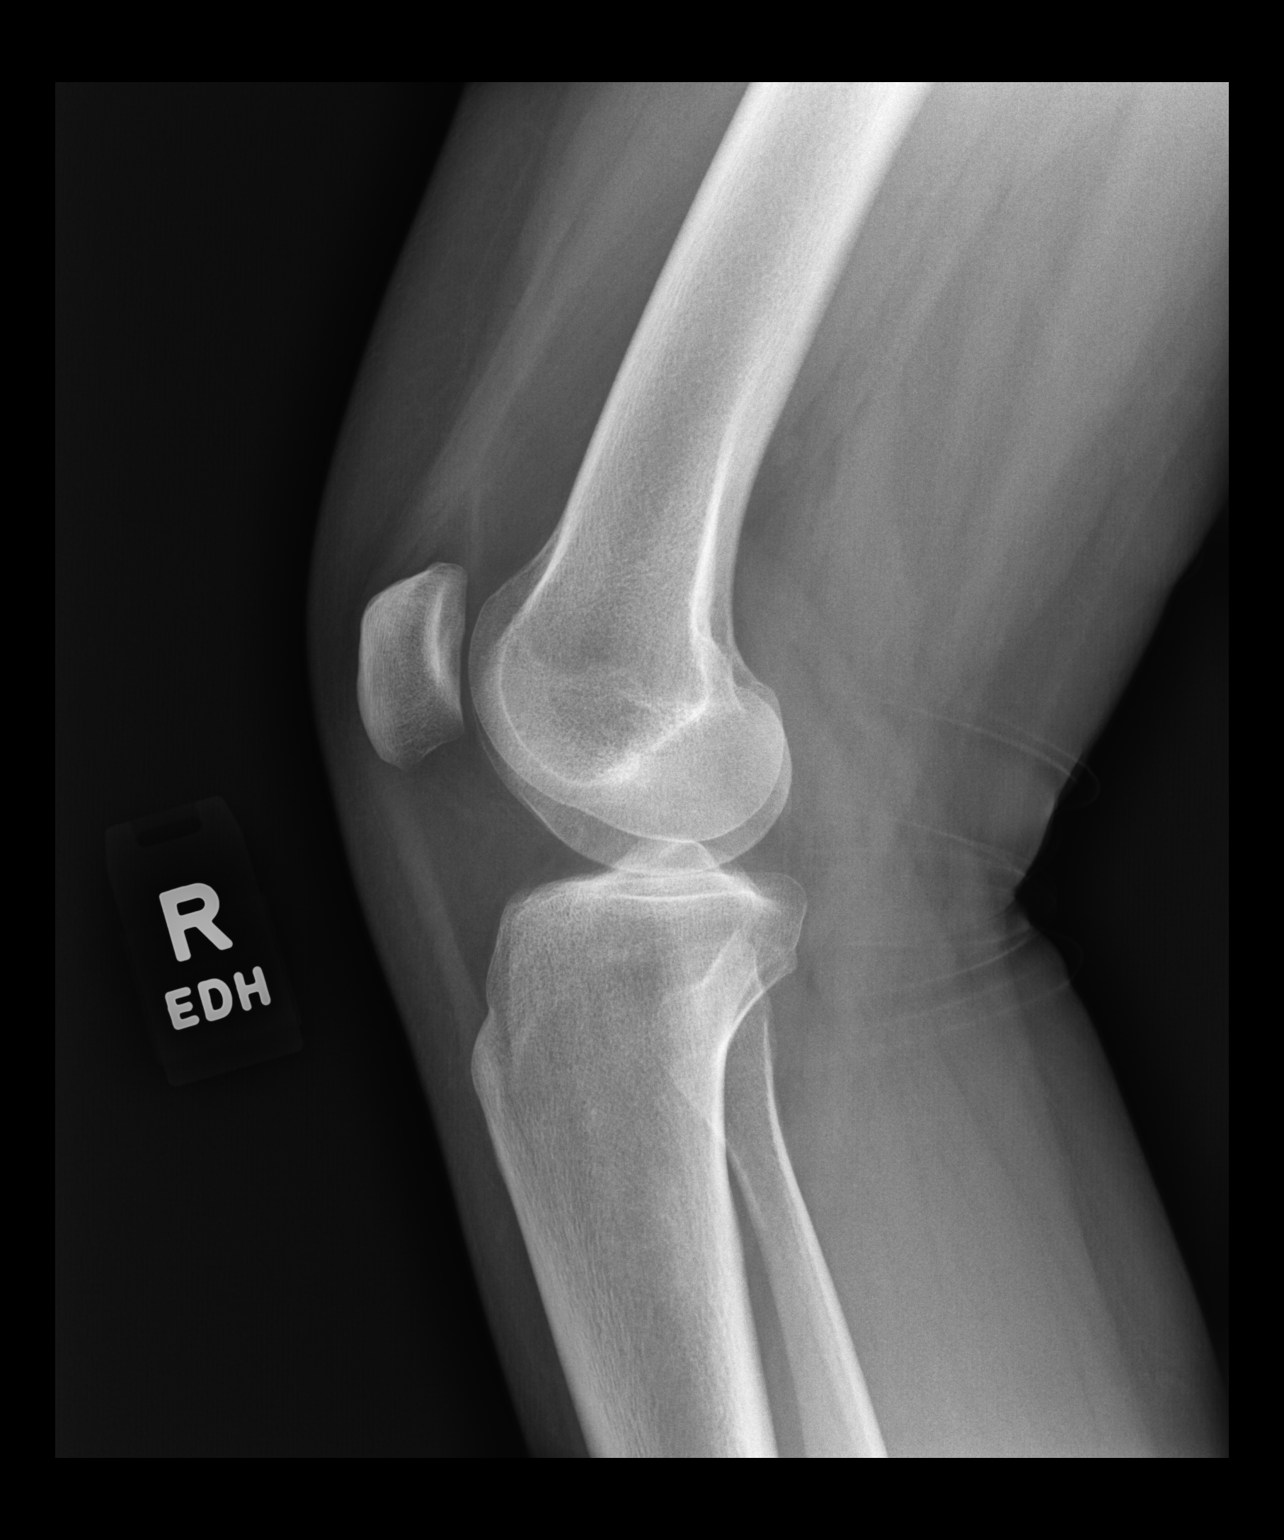

[2 of 2 positions shown; findings below may reference images not displayed]

FINDINGS: Standing AP and lateral view of both knees with patellar sunrise
views.

Right knee: Normal joint spaces and alignment. No osteophyte,
erosion, or focal bone abnormality. Patella normally situated in the
trochlear groove. No joint effusion. No focal soft tissue
abnormality.

Left knee: Normal joint spaces and alignment. No osteophytes,
erosion or focal bone abnormality. Patella normally situated in the
trochlear groove. No joint effusion. No focal soft tissue
abnormality.
IMPRESSION: Unremarkable radiographs of both knees.

## 2023-02-17 ENCOUNTER — Ambulatory Visit: Payer: Medicaid Other | Admitting: *Deleted

## 2023-02-17 ENCOUNTER — Ambulatory Visit: Payer: Medicaid Other | Attending: Obstetrics and Gynecology

## 2023-02-17 VITALS — BP 120/73 | HR 85

## 2023-02-17 DIAGNOSIS — Z362 Encounter for other antenatal screening follow-up: Secondary | ICD-10-CM | POA: Insufficient documentation

## 2023-02-17 DIAGNOSIS — E079 Disorder of thyroid, unspecified: Secondary | ICD-10-CM | POA: Insufficient documentation

## 2023-02-17 DIAGNOSIS — Z8279 Family history of other congenital malformations, deformations and chromosomal abnormalities: Secondary | ICD-10-CM | POA: Diagnosis not present

## 2023-02-17 DIAGNOSIS — E063 Autoimmune thyroiditis: Secondary | ICD-10-CM | POA: Diagnosis not present

## 2023-02-17 DIAGNOSIS — O99282 Endocrine, nutritional and metabolic diseases complicating pregnancy, second trimester: Secondary | ICD-10-CM | POA: Diagnosis not present

## 2023-02-17 DIAGNOSIS — Z3492 Encounter for supervision of normal pregnancy, unspecified, second trimester: Secondary | ICD-10-CM | POA: Diagnosis present

## 2023-02-17 DIAGNOSIS — Z3A25 25 weeks gestation of pregnancy: Secondary | ICD-10-CM | POA: Diagnosis not present

## 2023-02-17 DIAGNOSIS — O352XX0 Maternal care for (suspected) hereditary disease in fetus, not applicable or unspecified: Secondary | ICD-10-CM

## 2023-02-17 DIAGNOSIS — O219 Vomiting of pregnancy, unspecified: Secondary | ICD-10-CM | POA: Diagnosis present

## 2023-02-17 DIAGNOSIS — E039 Hypothyroidism, unspecified: Secondary | ICD-10-CM | POA: Diagnosis not present

## 2023-03-10 ENCOUNTER — Ambulatory Visit: Payer: Medicaid Other | Admitting: Obstetrics and Gynecology

## 2023-03-10 ENCOUNTER — Encounter: Payer: Self-pay | Admitting: Obstetrics and Gynecology

## 2023-03-10 ENCOUNTER — Other Ambulatory Visit: Payer: Medicaid Other

## 2023-03-10 VITALS — BP 109/71 | HR 84 | Wt 192.0 lb

## 2023-03-10 DIAGNOSIS — Z3400 Encounter for supervision of normal first pregnancy, unspecified trimester: Secondary | ICD-10-CM

## 2023-03-10 DIAGNOSIS — E079 Disorder of thyroid, unspecified: Secondary | ICD-10-CM

## 2023-03-10 DIAGNOSIS — Z1331 Encounter for screening for depression: Secondary | ICD-10-CM | POA: Diagnosis not present

## 2023-03-10 DIAGNOSIS — O99283 Endocrine, nutritional and metabolic diseases complicating pregnancy, third trimester: Secondary | ICD-10-CM

## 2023-03-10 DIAGNOSIS — Z23 Encounter for immunization: Secondary | ICD-10-CM

## 2023-03-10 DIAGNOSIS — Z3A28 28 weeks gestation of pregnancy: Secondary | ICD-10-CM

## 2023-03-10 DIAGNOSIS — Z3493 Encounter for supervision of normal pregnancy, unspecified, third trimester: Secondary | ICD-10-CM

## 2023-03-10 NOTE — Addendum Note (Signed)
Addended by: Kennon Portela on: 03/10/2023 08:53 AM   Modules accepted: Orders

## 2023-03-10 NOTE — Progress Notes (Signed)
ROB   CC: pt notes feeling as if she is going to pass out as she did in first trimester.

## 2023-03-10 NOTE — Progress Notes (Addendum)
   PRENATAL VISIT NOTE  Subjective:  Joan Kim is a 22 y.o. G2P0010 at [redacted]w[redacted]d being seen today for ongoing prenatal care.  She is currently monitored for the following issues for this high-risk pregnancy and has Hashimoto's thyroiditis; Vitamin D deficiency; Anxiety and depression; Iron deficiency; Migraine without aura and without status migrainosus, not intractable; Encounter for supervision of normal pregnancy; Nausea/vomiting in pregnancy; Near syncope; Family history of spina bifida; Mild intermittent asthma without complication; and Thyroid dysfunction in pregnancy, antepartum on their problem list.  Patient reports fatigue.  Contractions: Not present. Vag. Bleeding: None.  Movement: Present. Denies leaking of fluid.   The following portions of the patient's history were reviewed and updated as appropriate: allergies, current medications, past family history, past medical history, past social history, past surgical history and problem list.   Objective:   Vitals:   03/10/23 0824  BP: 109/71  Pulse: 84  Weight: 192 lb (87.1 kg)    Fetal Status: Fetal Heart Rate (bpm): 140 Fundal Height: 28 cm Movement: Present     General:  Alert, oriented and cooperative. Patient is in no acute distress.  Skin: Skin is warm and dry. No rash noted.   Cardiovascular: Normal heart rate noted  Respiratory: Normal respiratory effort, no problems with respiration noted  Abdomen: Soft, gravid, appropriate for gestational age.  Pain/Pressure: Present     Pelvic: Cervical exam deferred        Extremities: Normal range of motion.  Edema: Trace  Mental Status: Normal mood and affect. Normal behavior. Normal judgment and thought content.   Assessment and Plan:  Pregnancy: G2P0010 at [redacted]w[redacted]d 1. Encounter for supervision of normal pregnancy in third trimester, unspecified gravidity (Primary) Patient is doing well reporting return of near syncopal symptoms without syncope Will check CBC today as part of  third trimester labs to rule out anemia Glucola today Patient scheduled to follow up with cardiologist on 04/07/23 Patient is researching pediatrician Patient does not want hormonal contraception  2. Thyroid dysfunction in pregnancy, antepartum Currently not on any medications Scheduled with endocrinologist next week  Preterm labor symptoms and general obstetric precautions including but not limited to vaginal bleeding, contractions, leaking of fluid and fetal movement were reviewed in detail with the patient. Please refer to After Visit Summary for other counseling recommendations.   Return in about 2 weeks (around 03/24/2023) for in person, ROB, High risk.  Future Appointments  Date Time Provider Department Center  03/25/2023  9:35 AM Reva Bores, MD [facility]  04/07/2023 10:00 AM Thomasene Ripple, DO [facility] None  04/07/2023  2:30 PM Anyanwu, Jethro Bastos, MD [facility]  04/21/2023  9:35 AM Anyanwu, Jethro Bastos, MD [facility]    Catalina Antigua, MD

## 2023-03-11 ENCOUNTER — Other Ambulatory Visit: Payer: Self-pay | Admitting: *Deleted

## 2023-03-11 ENCOUNTER — Encounter: Payer: Self-pay | Admitting: Obstetrics and Gynecology

## 2023-03-11 ENCOUNTER — Other Ambulatory Visit: Payer: Self-pay | Admitting: Obstetrics and Gynecology

## 2023-03-11 DIAGNOSIS — O24419 Gestational diabetes mellitus in pregnancy, unspecified control: Secondary | ICD-10-CM | POA: Insufficient documentation

## 2023-03-11 HISTORY — DX: Gestational diabetes mellitus in pregnancy, unspecified control: O24.419

## 2023-03-11 LAB — CBC
Hematocrit: 29.4 % — ABNORMAL LOW (ref 34.0–46.6)
Hemoglobin: 9.5 g/dL — ABNORMAL LOW (ref 11.1–15.9)
MCH: 27 pg (ref 26.6–33.0)
MCHC: 32.3 g/dL (ref 31.5–35.7)
MCV: 84 fL (ref 79–97)
Platelets: 262 10*3/uL (ref 150–450)
RBC: 3.52 x10E6/uL — ABNORMAL LOW (ref 3.77–5.28)
RDW: 12.8 % (ref 11.7–15.4)
WBC: 9.1 10*3/uL (ref 3.4–10.8)

## 2023-03-11 LAB — GLUCOSE TOLERANCE, 2 HOURS W/ 1HR
Glucose, 1 hour: 185 mg/dL — ABNORMAL HIGH (ref 70–179)
Glucose, 2 hour: 135 mg/dL (ref 70–152)
Glucose, Fasting: 87 mg/dL (ref 70–91)

## 2023-03-11 LAB — RPR: RPR Ser Ql: NONREACTIVE

## 2023-03-11 LAB — HIV ANTIBODY (ROUTINE TESTING W REFLEX): HIV Screen 4th Generation wRfx: NONREACTIVE

## 2023-03-11 MED ORDER — GLUCOSE BLOOD VI STRP: ORAL_STRIP | 12 refills | Status: DC

## 2023-03-11 MED ORDER — ACCU-CHEK GUIDE W/DEVICE KIT: 1.0000 | PACK | Freq: Four times a day (QID) | 0 refills | Status: DC

## 2023-03-11 MED ORDER — FERROUS SULFATE 325 (65 FE) MG PO TBEC: 325.0000 mg | DELAYED_RELEASE_TABLET | ORAL | 3 refills | Status: DC

## 2023-03-11 MED ORDER — ACCU-CHEK SOFTCLIX LANCETS MISC: 1.0000 | Freq: Four times a day (QID) | 12 refills | Status: DC

## 2023-03-16 ENCOUNTER — Encounter: Payer: Medicaid Other | Attending: Obstetrics and Gynecology | Admitting: Dietician

## 2023-03-16 DIAGNOSIS — O24419 Gestational diabetes mellitus in pregnancy, unspecified control: Secondary | ICD-10-CM | POA: Diagnosis present

## 2023-03-16 NOTE — Progress Notes (Signed)
Patient was seen on 03/16/2023 for Gestational Diabetes self-management class at the [facility]. The following learning objectives were met by the patient during this course:  States the definition of Gestational Diabetes States why dietary management is important in controlling blood glucose Describes the effects each nutrient has on blood glucose levels Demonstrates ability to create a balanced meal plan Demonstrates carbohydrate counting  States when to check blood glucose levels Demonstrates proper blood glucose monitoring techniques States the effect of stress and exercise on blood glucose levels States the importance of limiting caffeine and abstaining from alcohol and smoking  Blood glucose monitor:Pt present with accu chek glucometer Fasting: 84, 93, 91 mg/dl  2 hour ppg: 16,109,60,45,409 mg/dL    Patient instructed to monitor glucose levels: QID FBS: 60 - <90 1 hour: <140 2 hour: <120  *Patient received handouts: Nutrition Diabetes and Pregnancy Carbohydrate Counting List Blood glucose log Snack ideas for diabetes during pregnancy  Patient will be seen for follow-up as needed.

## 2023-03-17 DIAGNOSIS — E063 Autoimmune thyroiditis: Secondary | ICD-10-CM | POA: Diagnosis not present

## 2023-03-18 DIAGNOSIS — E063 Autoimmune thyroiditis: Secondary | ICD-10-CM | POA: Diagnosis not present

## 2023-03-18 DIAGNOSIS — O99283 Endocrine, nutritional and metabolic diseases complicating pregnancy, third trimester: Secondary | ICD-10-CM | POA: Diagnosis not present

## 2023-03-21 ENCOUNTER — Ambulatory Visit: Payer: Medicaid Other

## 2023-03-25 ENCOUNTER — Ambulatory Visit (INDEPENDENT_AMBULATORY_CARE_PROVIDER_SITE_OTHER): Payer: Medicaid Other | Admitting: Family Medicine

## 2023-03-25 VITALS — BP 114/73 | HR 80 | Wt 189.2 lb

## 2023-03-25 DIAGNOSIS — O99280 Endocrine, nutritional and metabolic diseases complicating pregnancy, unspecified trimester: Secondary | ICD-10-CM

## 2023-03-25 DIAGNOSIS — O24419 Gestational diabetes mellitus in pregnancy, unspecified control: Secondary | ICD-10-CM

## 2023-03-25 DIAGNOSIS — E079 Disorder of thyroid, unspecified: Secondary | ICD-10-CM

## 2023-03-25 DIAGNOSIS — Z3493 Encounter for supervision of normal pregnancy, unspecified, third trimester: Secondary | ICD-10-CM

## 2023-03-25 DIAGNOSIS — O219 Vomiting of pregnancy, unspecified: Secondary | ICD-10-CM

## 2023-03-25 DIAGNOSIS — Z3A30 30 weeks gestation of pregnancy: Secondary | ICD-10-CM

## 2023-03-25 NOTE — Progress Notes (Signed)
ROB: Weight loss about 5 lbs per patient

## 2023-03-25 NOTE — Progress Notes (Signed)
   PRENATAL VISIT NOTE  Subjective:  Joan Kim is a 22 y.o. G2P0010 at [redacted]w[redacted]d being seen today for ongoing prenatal care.  She is currently monitored for the following issues for this high-risk pregnancy and has Hashimoto's thyroiditis; Vitamin D deficiency; Anxiety and depression; Iron deficiency; Migraine without aura and without status migrainosus, not intractable; Encounter for supervision of normal pregnancy; Nausea/vomiting in pregnancy; Near syncope; Family history of spina bifida; Mild intermittent asthma without complication; Thyroid dysfunction in pregnancy, antepartum; and Gestational diabetes mellitus (GDM) affecting pregnancy, antepartum on their problem list.  Patient reports no complaints.  Contractions: Not present. Vag. Bleeding: None.  Movement: Present. Denies leaking of fluid.   The following portions of the patient's history were reviewed and updated as appropriate: allergies, current medications, past family history, past medical history, past social history, past surgical history and problem list.   Objective:   Vitals:   03/25/23 0949  BP: 114/73  Pulse: 80  Weight: 189 lb 3.2 oz (85.8 kg)    Fetal Status: Fetal Heart Rate (bpm): 139 Fundal Height: 30 cm Movement: Present     General:  Alert, oriented and cooperative. Patient is in no acute distress.  Skin: Skin is warm and dry. No rash noted.   Cardiovascular: Normal heart rate noted  Respiratory: Normal respiratory effort, no problems with respiration noted  Abdomen: Soft, gravid, appropriate for gestational age.  Pain/Pressure: Absent     Pelvic: Cervical exam deferred        Extremities: Normal range of motion.  Edema: None  Mental Status: Normal mood and affect. Normal behavior. Normal judgment and thought content.   Assessment and Plan:  Pregnancy: G2P0010 at [redacted]w[redacted]d 1. Nausea/vomiting in pregnancy (Primary) Resolved Has some weight loss, but no longer throwing up, suspect water weight  2.  Thyroid dysfunction in pregnancy, antepartum Seen endocrinology, last results WNL On no meds  3. Gestational diabetes mellitus (GDM) affecting pregnancy, antepartum FBS 90 2 hour pp < 100 on APP on phone Has limited test strips, to check bid until refill Continue diet S=D  4. Encounter for supervision of normal pregnancy in third trimester, unspecified gravidity Continue prenatal care.  5. 30 weeks  Term labor symptoms and general obstetric precautions including but not limited to vaginal bleeding, contractions, leaking of fluid and fetal movement were reviewed in detail with the patient. Please refer to After Visit Summary for other counseling recommendations.   Return in 2 weeks (on 04/08/2023).  Future Appointments  Date Time Provider Department Center  04/07/2023 10:00 AM Thomasene Ripple, DO [facility] None  04/07/2023  2:30 PM Anyanwu, Jethro Bastos, MD [facility]  04/21/2023  9:35 AM Anyanwu, Jethro Bastos, MD [facility]    Reva Bores, MD

## 2023-04-05 ENCOUNTER — Encounter (HOSPITAL_COMMUNITY): Payer: Self-pay | Admitting: Obstetrics & Gynecology

## 2023-04-05 ENCOUNTER — Inpatient Hospital Stay (HOSPITAL_COMMUNITY)
Admission: AD | Admit: 2023-04-05 | Discharge: 2023-04-05 | Disposition: A | Discharge status: Home / Self Care | Payer: Medicaid Other | Attending: Obstetrics & Gynecology | Admitting: Obstetrics & Gynecology

## 2023-04-05 ENCOUNTER — Encounter: Payer: Self-pay | Admitting: Family Medicine

## 2023-04-05 DIAGNOSIS — R519 Headache, unspecified: Secondary | ICD-10-CM | POA: Insufficient documentation

## 2023-04-05 DIAGNOSIS — M545 Low back pain, unspecified: Secondary | ICD-10-CM | POA: Diagnosis not present

## 2023-04-05 DIAGNOSIS — Z3689 Encounter for other specified antenatal screening: Secondary | ICD-10-CM | POA: Insufficient documentation

## 2023-04-05 DIAGNOSIS — O99280 Endocrine, nutritional and metabolic diseases complicating pregnancy, unspecified trimester: Secondary | ICD-10-CM

## 2023-04-05 DIAGNOSIS — O26893 Other specified pregnancy related conditions, third trimester: Secondary | ICD-10-CM | POA: Diagnosis not present

## 2023-04-05 DIAGNOSIS — O24419 Gestational diabetes mellitus in pregnancy, unspecified control: Secondary | ICD-10-CM | POA: Diagnosis not present

## 2023-04-05 DIAGNOSIS — R42 Dizziness and giddiness: Secondary | ICD-10-CM | POA: Diagnosis not present

## 2023-04-05 DIAGNOSIS — O368130 Decreased fetal movements, third trimester, not applicable or unspecified: Secondary | ICD-10-CM | POA: Insufficient documentation

## 2023-04-05 DIAGNOSIS — O219 Vomiting of pregnancy, unspecified: Secondary | ICD-10-CM

## 2023-04-05 DIAGNOSIS — Z3A31 31 weeks gestation of pregnancy: Secondary | ICD-10-CM | POA: Diagnosis not present

## 2023-04-05 LAB — URINALYSIS, ROUTINE W REFLEX MICROSCOPIC
Bilirubin Urine: NEGATIVE
Glucose, UA: 50 mg/dL — AB
Hgb urine dipstick: NEGATIVE
Ketones, ur: 20 mg/dL — AB
Nitrite: NEGATIVE
Protein, ur: NEGATIVE mg/dL
Specific Gravity, Urine: 1.009 (ref 1.005–1.030)
pH: 7 (ref 5.0–8.0)

## 2023-04-05 LAB — GLUCOSE, CAPILLARY: Glucose-Capillary: 106 mg/dL — ABNORMAL HIGH (ref 70–99)

## 2023-04-05 MED ORDER — ACETAMINOPHEN-CAFFEINE 500-65 MG PO TABS
2.0000 | ORAL_TABLET | Freq: Once | ORAL | Status: AC
  Administered 2023-04-05: 2 via ORAL
  Filled 2023-04-05: qty 2

## 2023-04-05 MED ORDER — CYCLOBENZAPRINE HCL 5 MG PO TABS: 5.0000 mg | ORAL_TABLET | Freq: Three times a day (TID) | ORAL | 0 refills | Status: DC | PRN

## 2023-04-05 MED ORDER — CYCLOBENZAPRINE HCL 5 MG PO TABS
5.0000 mg | ORAL_TABLET | Freq: Once | ORAL | Status: AC
  Administered 2023-04-05: 5 mg via ORAL
  Filled 2023-04-05: qty 1

## 2023-04-05 NOTE — MAU Note (Addendum)
.  Joan Kim is a 23 y.o. at [redacted]w[redacted]d here in MAU reporting feeling FM today but not as much or as strong as usual. Feels like I am going to pass out for an hour. Headache all day. Has not taken anything for h/a. Denies LOF or VB.   Onset of complaint: this am Pain score: 2 Vitals:   04/05/23 1951 04/05/23 1955  BP:  111/68  Pulse: 94   Resp: 18   Temp: 97.9 F (36.6 C)   SpO2: 100%      FHT:153 Lab orders placed from triage: u/a

## 2023-04-05 NOTE — MAU Provider Note (Signed)
 Chief Complaint:  Decreased Fetal Movement and Headache   Event Date/Time   First Provider Initiated Contact with Patient 04/05/23 2011     HPI: Joan Kim is a 23 y.o. G2P0010 at 65w6dwho presents to maternity admissions reporting decreased fetal movement.  Tried drinking Pepsi without effect  Also has felt lightheaded all day.  States blood sugars have been WNL (GDM)  Has had a low grade headache all day, not treated. . She denies LOF, vaginal bleeding, h/a, n/v, or fever/chills.   Headache  This is a new problem. The current episode started today. The pain is at a severity of 2/10. Associated symptoms include nausea and weakness (overall lightheadedness). Pertinent negatives include no abdominal pain, back pain, fever or muscle aches. Nothing aggravates the symptoms. She has tried nothing for the symptoms.   RN Note [name] is a 23 y.o. at [redacted]w[redacted]d here in MAU reporting feeling FM today but not as much or as strong as usual. Feels like I am going to pass out for an hour. Headache all day. Has not taken anything for h/a. Denies LOF or VB.   Onset of complaint: this am                    Pain score: 2  Past Medical History: Past Medical History:  Diagnosis Date   Asthma    Bacterial vaginitis    Dyspepsia 08/30/2019   Fatigue 08/12/2020   GERD (gastroesophageal reflux disease)    H/O bladder infections    Helicobacter pylori antibody positive 03/2019   Insomnia 09/18/2020    Past obstetric history: OB History  Gravida Para Term Preterm AB Living  2 0 0 0 1 0  SAB IAB Ectopic Multiple Live Births  1 0 0 0 0    # Outcome Date GA Lbr Len/2nd Weight Sex Type Anes PTL Lv  2 Current           1 SAB 07/2022            Past Surgical History: Past Surgical History:  Procedure Laterality Date   NO PAST SURGERIES      Family History: Family History  Problem Relation Age of Onset   Emphysema Maternal Uncle    Hypertension Maternal Grandmother    Diabetes  Paternal Grandmother    Hypertension Paternal Grandfather    Heart disease Paternal Grandfather    Bladder Cancer Paternal Grandfather    Irritable bowel syndrome Other    Colon cancer Neg Hx    Esophageal cancer Neg Hx    Rectal cancer Neg Hx    Stomach cancer Neg Hx     Social History: Social History   Tobacco Use   Smoking status: Never    Passive exposure: Yes   Smokeless tobacco: Never  Vaping Use   Vaping status: Never Used  Substance Use Topics   Alcohol use: Not Currently   Drug use: Never    Allergies:  Allergies  Allergen Reactions   Latex Rash    Meds:  Medications Prior to Admission  Medication Sig Dispense Refill Last Dose/Taking   Accu-Chek Softclix Lancets lancets 1 each by Other route 4 (four) times daily. 100 each 12    Blood Glucose Monitoring Suppl (ACCU-CHEK GUIDE) w/Device KIT 1 Device by Does not apply route 4 (four) times daily. 1 kit 0    glucose blood test strip Use as instructed 100 each 12    acetaminophen  (TYLENOL ) 325 MG tablet Take 650  mg by mouth every 6 (six) hours as needed. (Patient not taking: Reported on 02/17/2023)      albuterol  (VENTOLIN  HFA) 108 (90 Base) MCG/ACT inhaler INHALE 2 PUFFS INTO THE LUNGS EVERY 4 HOURS AS NEEDED FOR WHEEZING OR SHORTNESS OF BREATH. 18 each 3    busPIRone  (BUSPAR ) 5 MG tablet Take 5 mg by mouth 3 (three) times daily. (Patient not taking: Reported on 02/17/2023)      famotidine  (PEPCID ) 20 MG tablet Take 1 tablet (20 mg total) by mouth 2 (two) times daily. (Patient not taking: Reported on 02/04/2023) 60 tablet 6    ferrous sulfate  325 (65 FE) MG EC tablet Take 1 tablet (325 mg total) by mouth every other day. 30 tablet 3    Prenatal Multivit-Min-Fe-FA (PRENATAL/IRON ) TABS Take 1 tablet by mouth daily. 90 tablet 3    promethazine  (PHENERGAN ) 25 MG tablet Take 25 mg by mouth every 6 (six) hours as needed for nausea or vomiting.      sertraline  (ZOLOFT ) 100 MG tablet Take 1 tablet (100 mg total) by mouth daily.  30 tablet 6     I have reviewed patient's Past Medical Hx, Surgical Hx, Family Hx, Social Hx, medications and allergies.   ROS:  Review of Systems  Constitutional:  Negative for fever.  Gastrointestinal:  Positive for nausea. Negative for abdominal pain.  Musculoskeletal:  Negative for back pain.  Neurological:  Positive for weakness (overall lightheadedness) and headaches.   Other systems negative  Physical Exam  Patient Vitals for the past 24 hrs:  BP Temp Pulse Resp SpO2 Height Weight  04/05/23 1955 111/68 -- -- -- -- -- --  04/05/23 1951 -- 97.9 F (36.6 C) 94 18 100 % 5' 6 (1.676 m) 87.1 kg   Constitutional: Well-developed, well-nourished female in no acute distress.  Cardiovascular: normal rate and rhythm Respiratory: normal effort, clear to auscultation bilaterally GI: Abd soft, non-tender, gravid appropriate for gestational age.   No rebound or guarding. MS: Extremities nontender, no edema, normal ROM Neurologic: Alert and oriented x 4.  GU: Neg CVAT.  PELVIC EXAM:   Deferred  FHT:  Baseline 145 , moderate variability, accelerations present, no decelerations Contractions:  Rare   Labs: Results for orders placed or performed during the hospital encounter of 04/05/23 (from the past 24 hours)  Urinalysis, Routine w reflex microscopic -Urine, Clean Catch     Status: Abnormal   Collection Time: 04/05/23  8:00 PM  Result Value Ref Range   Color, Urine YELLOW YELLOW   APPearance HAZY (A) CLEAR   Specific Gravity, Urine 1.009 1.005 - 1.030   pH 7.0 5.0 - 8.0   Glucose, UA 50 (A) NEGATIVE mg/dL   Hgb urine dipstick NEGATIVE NEGATIVE   Bilirubin Urine NEGATIVE NEGATIVE   Ketones, ur 20 (A) NEGATIVE mg/dL   Protein, ur NEGATIVE NEGATIVE mg/dL   Nitrite NEGATIVE NEGATIVE   Leukocytes,Ua TRACE (A) NEGATIVE   RBC / HPF 0-5 0 - 5 RBC/hpf   WBC, UA 0-5 0 - 5 WBC/hpf   Bacteria, UA RARE (A) NONE SEEN   Squamous Epithelial / HPF 6-10 0 - 5 /HPF   Mucus PRESENT    Glucose, capillary     Status: Abnormal   Collection Time: 04/05/23  8:36 PM  Result Value Ref Range   Glucose-Capillary 106 (H) 70 - 99 mg/dL    A/Positive/-- (92/73 9141)  Imaging:  No results found.  MAU Course/MDM: I have reviewed the triage vital signs and the nursing  notes.   Pertinent labs & imaging results that were available during my care of the patient were reviewed by me and considered in my medical decision making (see chart for details).      I have reviewed her medical records including past results, notes and treatments.   I have ordered labs and reviewed results. These are WNL Orthostatic VS:  No significant change with position change Reports some long term low back pain, Flexeril  5mg  given with some relief. Will Rx for home use and if pain persists, may need Physical Therapy referral NST reviewed   Treatments in MAU included Excedrin  Tension relieved her headache. .   Discussed lightheadedness may reflect sensitivity to dehydration or low blood sugar changes.  Needs to stay well hydrated and eat regularly while checking sugars.    Assessment: Single IUP at [redacted]w[redacted]d Decreased fetal movement Lightheadedness Low back pain   Plan: Discharge home Maintain hydration and regular food intake, while monitoring glucose levels Rx Flexeril  for prn use for back pain Preterm Labor precautions and fetal kick counts Follow up in Office for prenatal visits and recheck Encouraged to return if she develops worsening of symptoms, increase in pain, fever, or other concerning symptoms.   Pt stable at time of discharge.  Earnie Pouch CNM, MSN Certified Nurse-Midwife 04/05/2023 8:11 PM

## 2023-04-07 ENCOUNTER — Ambulatory Visit (INDEPENDENT_AMBULATORY_CARE_PROVIDER_SITE_OTHER): Payer: Medicaid Other | Admitting: Obstetrics & Gynecology

## 2023-04-07 ENCOUNTER — Ambulatory Visit: Payer: Medicaid Other | Attending: Cardiology | Admitting: Cardiology

## 2023-04-07 ENCOUNTER — Encounter: Payer: Self-pay | Admitting: Cardiology

## 2023-04-07 ENCOUNTER — Encounter: Payer: Medicaid Other | Admitting: Obstetrics & Gynecology

## 2023-04-07 ENCOUNTER — Encounter: Payer: Self-pay | Admitting: Obstetrics & Gynecology

## 2023-04-07 VITALS — BP 108/70 | HR 88 | Ht 66.0 in | Wt 189.4 lb

## 2023-04-07 VITALS — BP 105/68 | HR 94 | Wt 190.0 lb

## 2023-04-07 DIAGNOSIS — O99280 Endocrine, nutritional and metabolic diseases complicating pregnancy, unspecified trimester: Secondary | ICD-10-CM | POA: Diagnosis not present

## 2023-04-07 DIAGNOSIS — O99013 Anemia complicating pregnancy, third trimester: Secondary | ICD-10-CM | POA: Insufficient documentation

## 2023-04-07 DIAGNOSIS — Z3A32 32 weeks gestation of pregnancy: Secondary | ICD-10-CM

## 2023-04-07 DIAGNOSIS — O24410 Gestational diabetes mellitus in pregnancy, diet controlled: Secondary | ICD-10-CM

## 2023-04-07 DIAGNOSIS — O0993 Supervision of high risk pregnancy, unspecified, third trimester: Secondary | ICD-10-CM

## 2023-04-07 DIAGNOSIS — D509 Iron deficiency anemia, unspecified: Secondary | ICD-10-CM

## 2023-04-07 DIAGNOSIS — E079 Disorder of thyroid, unspecified: Secondary | ICD-10-CM | POA: Diagnosis not present

## 2023-04-07 DIAGNOSIS — O99283 Endocrine, nutritional and metabolic diseases complicating pregnancy, third trimester: Secondary | ICD-10-CM

## 2023-04-07 DIAGNOSIS — R42 Dizziness and giddiness: Secondary | ICD-10-CM | POA: Diagnosis not present

## 2023-04-07 NOTE — Patient Instructions (Signed)
 Medication Instructions:  None    *If you need a refill on your cardiac medications before your next appointment, please call your pharmacy*   Lab Work: None    If you have labs (blood work) drawn today and your tests are completely normal, you will receive your results only by: MyChart Message (if you have MyChart) OR A paper copy in the mail If you have any lab test that is abnormal or we need to change your treatment, we will call you to review the results.   Testing/Procedures: None    Follow-Up: At [facility], you and your health needs are our priority.  As part of our continuing mission to provide you with exceptional heart care, we have created designated Provider Care Teams.  These Care Teams include your primary Cardiologist (physician) and Advanced Practice Providers (APPs -  Physician Assistants and Nurse Practitioners) who all work together to provide you with the care you need, when you need it.  We recommend signing up for the patient portal called MyChart.  Sign up information is provided on this After Visit Summary.  MyChart is used to connect with patients for Virtual Visits (Telemedicine).  Patients are able to view lab/test results, encounter notes, upcoming appointments, etc.  Non-urgent messages can be sent to your provider as well.   To learn more about what you can do with MyChart, go to [email or web address].    Your next appointment:   12 week(s)  The format for your next appointment:   Virtual Visit   Provider:   Kardie Tobb, DO    Other Instructions

## 2023-04-07 NOTE — Progress Notes (Signed)
 ROB   CC: Not gaining, throwing up at night now. Just refilled Nausea Rx.

## 2023-04-07 NOTE — Progress Notes (Signed)
   PRENATAL VISIT NOTE  Subjective:  Joan Kim is a 23 y.o. G2P0010 at [redacted]w[redacted]d being seen today for ongoing prenatal care.  She is currently monitored for the following issues for this high-risk pregnancy and has Hashimoto's thyroiditis; Vitamin D deficiency; Anxiety and depression; Iron  deficiency; Migraine without aura and without status migrainosus, not intractable; Supervision of high-risk pregnancy; Nausea/vomiting in pregnancy; Near syncope; Family history of spina bifida; Mild intermittent asthma without complication; Thyroid  dysfunction in pregnancy, antepartum; Gestational diabetes mellitus (GDM) affecting pregnancy, antepartum; and Anemia affecting pregnancy in third trimester on their problem list.  Patient reports no complaints.  Contractions: Irritability. Vag. Bleeding: None.  Movement: Present. Denies leaking of fluid.   The following portions of the patient's history were reviewed and updated as appropriate: allergies, current medications, past family history, past medical history, past social history, past surgical history and problem list.   Objective:   Vitals:   04/07/23 1433  BP: 105/68  Pulse: 94  Weight: 190 lb (86.2 kg)    Fetal Status: Fetal Heart Rate (bpm): 155 Fundal Height: 33 cm Movement: Present     General:  Alert, oriented and cooperative. Patient is in no acute distress.  Skin: Skin is warm and dry. No rash noted.   Cardiovascular: Normal heart rate noted  Respiratory: Normal respiratory effort, no problems with respiration noted  Abdomen: Soft, gravid, appropriate for gestational age.  Pain/Pressure: Present     Pelvic: Cervical exam deferred        Extremities: Normal range of motion.  Edema: None  Mental Status: Normal mood and affect. Normal behavior. Normal judgment and thought content.   Assessment and Plan:  Pregnancy: G2P0010 at [redacted]w[redacted]d 1. Diet controlled gestational diabetes mellitus (GDM) in third trimester (Primary) CBGs reviewed,  within range. Continue diet control. Growth scan to be scheduled around 37-38 weeks. IOL 39-40 weeks. - US  MFM OB FOLLOW UP; Future  2. Thyroid  dysfunction in pregnancy, antepartum No meds, no recent labs. This was checked today, will follow up results and manage accordingly. - TSH + free T4  3. Anemia affecting pregnancy in third trimester On ferrous sulfate  every other day. Will check labs today, may need to adjust oral regimen or consider Venofer. - CBC - Ferritin  4. [redacted] weeks gestation of pregnancy 5. Supervision of high risk pregnancy in third trimester No other concerns. Preterm labor symptoms and general obstetric precautions including but not limited to vaginal bleeding, contractions, leaking of fluid and fetal movement were reviewed in detail with the patient. Please refer to After Visit Summary for other counseling recommendations.   Return in about 2 weeks (around 04/21/2023) for OFFICE OB VISIT (MD), possible RSV vaccine.  Future Appointments  Date Time Provider Department Center  04/21/2023  9:35 AM Adilenne Ashworth, Gloris LABOR, MD [facility]  05/05/2023  9:35 AM Rochester Serpe, Gloris LABOR, MD [facility]  05/12/2023  9:35 AM Izell Harari, MD [facility]  05/19/2023  9:35 AM Herchel, Gloris LABOR, MD [facility]  05/26/2023  9:35 AM Izell Harari, MD [facility]  07/01/2023 10:20 AM Tobb, Dub, DO [facility] None    Gloris Herchel, MD

## 2023-04-07 NOTE — Progress Notes (Signed)
 Cardio-Obstetrics Clinic  Follow Up Note   Date:  04/07/2023   ID:  Joan Kim, DOB 05-18-2000, MRN [id number]  PCP:  Fredirick Glenys RAMAN, MD   [facility] Providers Cardiologist:  Dub Huntsman, DO  Electrophysiologist:  None        Referring MD: Fredirick Glenys RAMAN, MD   Chief Complaint:  I am ok   History of Present Illness:    Joan Kim is a 23 y.o. female [G2P0010] who returns for follow up.   Medical history includes Asthma and GERD.   At her last visit she was experiencing dizziness and presyncope episodes. She wore a monitor which was normal. She is still experiencing nausea but much improve.   Prior CV Studies Reviewed: The following studies were reviewed today: Reviewed echo and monitor   Past Medical History:  Diagnosis Date   Asthma    Bacterial vaginitis    Dyspepsia 08/30/2019   Fatigue 08/12/2020   GERD (gastroesophageal reflux disease)    H/O bladder infections    Helicobacter pylori antibody positive 03/2019   Insomnia 09/18/2020    Past Surgical History:  Procedure Laterality Date   NO PAST SURGERIES        OB History     Gravida  2   Para  0   Term  0   Preterm  0   AB  1   Living  0      SAB  1   IAB  0   Ectopic  0   Multiple  0   Live Births  0               Current Medications: Current Meds  Medication Sig   Accu-Chek Softclix Lancets lancets 1 each by Other route 4 (four) times daily.   albuterol  (VENTOLIN  HFA) 108 (90 Base) MCG/ACT inhaler INHALE 2 PUFFS INTO THE LUNGS EVERY 4 HOURS AS NEEDED FOR WHEEZING OR SHORTNESS OF BREATH.   Blood Glucose Monitoring Suppl (ACCU-CHEK GUIDE) w/Device KIT 1 Device by Does not apply route 4 (four) times daily.   busPIRone  (BUSPAR ) 5 MG tablet Take 5 mg by mouth 3 (three) times daily.   cyclobenzaprine  (FLEXERIL ) 5 MG tablet Take 1 tablet (5 mg total) by mouth every 8 (eight) hours as needed.   ferrous sulfate  325 (65 FE) MG EC tablet Take 1 tablet (325  mg total) by mouth every other day.   glucose blood test strip Use as instructed   Prenatal Multivit-Min-Fe-FA (PRENATAL/IRON ) TABS Take 1 tablet by mouth daily.   promethazine  (PHENERGAN ) 25 MG tablet Take 25 mg by mouth every 6 (six) hours as needed for nausea or vomiting.   sertraline  (ZOLOFT ) 100 MG tablet Take 1 tablet (100 mg total) by mouth daily.     Allergies:   Latex   Social History   Socioeconomic History   Marital status: Single    Spouse name: Not on file   Number of children: Not on file   Years of education: Not on file   Highest education level: Not on file  Occupational History   Occupation: Student  Tobacco Use   Smoking status: Never    Passive exposure: Yes   Smokeless tobacco: Never  Vaping Use   Vaping status: Never Used  Substance and Sexual Activity   Alcohol use: Not Currently   Drug use: Never   Sexual activity: Yes    Partners: Male  Other Topics Concern   Not on file  Social History Narrative   Single lives with her parents splits time between each layer divorced.   Graduate [employer] 2020   Working for [employer]   No EtOH, tobacco, caffeine , drugs   Social Drivers of Corporate Investment Banker Strain: Not on file  Food Insecurity: No Food Insecurity (03/16/2023)   Hunger Vital Sign    Worried About Running Out of Food in the Last Year: Never true    Ran Out of Food in the Last Year: Never true  Transportation Needs: No Transportation Needs (11/14/2022)   PRAPARE - Administrator, Civil Service (Medical): No    Lack of Transportation (Non-Medical): No  Physical Activity: Not on file  Stress: Not on file  Social Connections: Not on file      Family History  Problem Relation Age of Onset   Emphysema Maternal Uncle    Hypertension Maternal Grandmother    Diabetes Paternal Grandmother    Hypertension Paternal Grandfather    Heart disease Paternal Grandfather    Bladder Cancer Paternal Grandfather    Irritable  bowel syndrome Other    Colon cancer Neg Hx    Esophageal cancer Neg Hx    Rectal cancer Neg Hx    Stomach cancer Neg Hx       ROS:   Please see the history of present illness.     All other systems reviewed and are negative.   Labs/EKG Reviewed:    EKG:   EKG was not  ordered today.    Recent Labs: 08/18/2022: ALT 15; BUN 6; Creatinine, Ser 0.59; Potassium 3.5; Sodium 136 03/10/2023: Hemoglobin 9.5; Platelets 262   Recent Lipid Panel No results found for: CHOL, TRIG, HDL, CHOLHDL, LDLCALC, LDLDIRECT  Physical Exam:    VS:  BP 108/70 (BP Location: Right Arm, Patient Position: Sitting, Cuff Size: Normal)   Pulse 88   Ht 5' 6 (1.676 m)   Wt 189 lb 6.4 oz (85.9 kg)   LMP 08/19/2022   SpO2 99%   BMI 30.57 kg/m     Wt Readings from Last 3 Encounters:  04/07/23 190 lb (86.2 kg)  04/07/23 189 lb 6.4 oz (85.9 kg)  04/05/23 192 lb (87.1 kg)     GEN:  Well nourished, well developed in no acute distress HEENT: Normal NECK: No JVD; No carotid bruits LYMPHATICS: No lymphadenopathy CARDIAC: RRR, no murmurs, rubs, gallops RESPIRATORY:  Clear to auscultation without rales, wheezing or rhonchi  ABDOMEN: Soft, non-tender, non-distended MUSCULOSKELETAL:  No edema; No deformity  SKIN: Warm and dry NEUROLOGIC:  Alert and oriented x 3 PSYCHIATRIC:  Normal affect    Risk Assessment/Risk Calculators:                  ASSESSMENT & PLAN:    Pregnancy at 32 weeks Reports of nausea and occasional dizziness. Suspected IVC compression due to baby's position. Blood pressure within normal range. Encouraged to monitor blood pressure at home, especially during episodes of dizziness. Scheduled follow-up in 8 weeks.   General Health Maintenance -Continue regular visits with OB/GYN. -Encouraged to maintain good hydration.  Patient Instructions  Medication Instructions:  None    *If you need a refill on your cardiac medications before your next appointment,  please call your pharmacy*   Lab Work: None    If you have labs (blood work) drawn today and your tests are completely normal, you will receive your results only by: MyChart Message (if you have MyChart) OR  A paper copy in the mail If you have any lab test that is abnormal or we need to change your treatment, we will call you to review the results.   Testing/Procedures: None    Follow-Up: At [facility], you and your health needs are our priority.  As part of our continuing mission to provide you with exceptional heart care, we have created designated Provider Care Teams.  These Care Teams include your primary Cardiologist (physician) and Advanced Practice Providers (APPs -  Physician Assistants and Nurse Practitioners) who all work together to provide you with the care you need, when you need it.  We recommend signing up for the patient portal called MyChart.  Sign up information is provided on this After Visit Summary.  MyChart is used to connect with patients for Virtual Visits (Telemedicine).  Patients are able to view lab/test results, encounter notes, upcoming appointments, etc.  Non-urgent messages can be sent to your provider as well.   To learn more about what you can do with MyChart, go to [email or web address].    Your next appointment:   12 week(s)  The format for your next appointment:   Virtual Visit   Provider:   Magali Bray, DO    Other Instructions    Dispo:  No follow-ups on file.   Medication Adjustments/Labs and Tests Ordered: Current medicines are reviewed at length with the patient today.  Concerns regarding medicines are outlined above.  Tests Ordered: No orders of the defined types were placed in this encounter.  Medication Changes: No orders of the defined types were placed in this encounter.

## 2023-04-07 NOTE — Patient Instructions (Addendum)
 RSV Vaccination for Pregnant People  CDC recommends two ways to protect babies from getting very sick with Respiratory Syncytial Virus (RSV):  An RSV vaccination given during pregnancy  Pfizer's vaccine (Abrysvo) is recommended for use during pregnancy. It is given during RSV season to people who are 32 through [redacted] weeks pregnant.  Or, An RSV immunization given directly to infants and some older babies  Babies born to mothers who get RSV vaccine at least 2 weeks before delivery will have protection and, in most cases, should not need an RSV immunization later.    When is RSV season?  In most regions of the [place]  RSV season starts in the fall and peaks in the winter, but the timing and severity of RSV season can vary from place to place and year to year.   The goal of maternal RSV vaccination is to protect babies from getting very sick with RSV during their first RSV season.  In most of the continental [place] , this means maternal RSV vaccine will be given in September through January.  Who should get the maternal RSV vaccine?  People who are 58 through [redacted] weeks pregnant during September through January should get one dose of maternal RSV vaccine to protect their babies. RSV season can vary around the country.   How is the maternal RSV vaccine administered?  Maternal RSV vaccine is given as a shot into the mother's upper arm. Only a single dose (one shot) of maternal RSV vaccine is recommended.   It is not yet known whether another dose might be needed in later pregnancies.  How well does the maternal RSV vaccine work?  When someone gets RSV vaccine, their body responds by making a protein that protects against the virus that causes RSV. The process takes about 2 weeks. When a pregnant person gets RSV vaccine, their protective proteins (called antibodies) also pass to their baby. So, babies who are born at least 2 weeks after their mother gets RSV vaccine are protected  at birth, when infants are at the highest risk of severe RSV disease.   The vaccine can reduce a baby's risk of being hospitalized from RSV by 57% in the first six months after birth.  What are the possible side effects of the maternal RSV vaccine?  In the clinical trials, the side effects most often reported by pregnant people who received the maternal RSV vaccine were pain at the injection site, headache, muscle pain, and nausea.  Although not common, a dangerous high blood pressure condition called pre-eclampsia occurred in 1.8% of pregnant people who received the maternal RSV vaccine compared to 1.4% of pregnant people who received a placebo.  The clinical trials identified a small increase in the number of preterm births in vaccinated pregnant people. It is not clear if this is a true safety problem related to RSV vaccine or if this occurred for reasons unrelated to vaccination.  To reduce the potential risk of preterm birth and complications from RSV disease, FDA approved the maternal RSV vaccine for use during weeks 32 through 60 of pregnancy while additional studies are conducted.  FDA is requiring the manufacturer to do additional studies that will look more closely at the potential risk of preterm births and pregnancy-related high blood pressure issues in mothers, including pre-eclampsia.  Severe allergic reactions to vaccines are rare but can happen after any vaccine and can be life-threatening. If you see signs of a severe allergic reaction after vaccination (hives, swelling of the face  and throat, difficulty breathing, a fast heartbeat, dizziness, or weakness), seek immediate medical care by calling 911.  As with any medicine or vaccine there is a very remote chance of the vaccine causing other serious injury or death after vaccination.  Adverse events following vaccination should be reported to the Vaccine Adverse Event Reporting System (VAERS), even if it's not clear that the vaccine  caused the adverse event. You or your doctor can report an adverse event to [facility] and FDA through VAERS. If you need further assistance reporting to VAERS, please email info@VAERS .org or call [phone].  If you have any questions about side effects from the maternal RSV vaccine, talk with your healthcare provider.  Do I need a prescription for a maternal RSV vaccine?  Until the vaccine available in the office, you will need a prescription to take to a local pharmacy that is providing the vaccine.   How do I pay for the maternal RSV vaccine?  Most private health insurance plans cover the maternal RSV vaccine, but there may be a cost to you depending on your plan.  Contact your insurer to find out.  Medicaid Beginning December 27, 2021, most people with coverage from [facility] and [employer] [facility]) will be guaranteed coverage of all vaccines recommended by the Advisory Committee on Immunization Practice at no cost to them.   Source: [facility] for Immunization and Respiratory Diseases Return to office for any scheduled appointments. Call the office or go to the MAU at [facility] if: You begin to have strong, frequent contractions Your water  breaks.  Sometimes it is a big gush of fluid, sometimes it is just a trickle that keeps getting your underwear wet or running down your legs You have vaginal bleeding.  It is normal to have a small amount of spotting if your cervix was checked.  You do not feel your baby moving like normal.  If you do not, get something to eat and drink and lay down and focus on feeling your baby move.   If your baby is still not moving like normal, you should call the office or go to MAU. Any other obstetric concerns.

## 2023-04-09 LAB — CBC
Hematocrit: 31 % — ABNORMAL LOW (ref 34.0–46.6)
Hemoglobin: 9.9 g/dL — ABNORMAL LOW (ref 11.1–15.9)
MCH: 26.3 pg — ABNORMAL LOW (ref 26.6–33.0)
MCHC: 31.9 g/dL (ref 31.5–35.7)
MCV: 82 fL (ref 79–97)
Platelets: 289 10*3/uL (ref 150–450)
RBC: 3.76 x10E6/uL — ABNORMAL LOW (ref 3.77–5.28)
RDW: 14 % (ref 11.7–15.4)
WBC: 10.2 10*3/uL (ref 3.4–10.8)

## 2023-04-09 LAB — FERRITIN: Ferritin: 8 ng/mL — ABNORMAL LOW (ref 15–150)

## 2023-04-09 LAB — TSH+FREE T4
Free T4: 0.89 ng/dL (ref 0.82–1.77)
TSH: 0.609 u[IU]/mL (ref 0.450–4.500)

## 2023-04-11 MED ORDER — FERRIC MALTOL 30 MG PO CAPS: 1.0000 | ORAL_CAPSULE | Freq: Two times a day (BID) | ORAL | 2 refills | Status: DC

## 2023-04-11 NOTE — Addendum Note (Signed)
 Addended by: Jaynie Collins A on: 04/11/2023 09:05 AM   Modules accepted: Orders

## 2023-04-18 ENCOUNTER — Encounter: Payer: Self-pay | Admitting: Obstetrics & Gynecology

## 2023-04-21 ENCOUNTER — Ambulatory Visit: Payer: Medicaid Other | Admitting: Obstetrics & Gynecology

## 2023-04-21 VITALS — BP 110/71 | HR 84 | Wt 191.0 lb

## 2023-04-21 DIAGNOSIS — O24410 Gestational diabetes mellitus in pregnancy, diet controlled: Secondary | ICD-10-CM

## 2023-04-21 DIAGNOSIS — Z3A34 34 weeks gestation of pregnancy: Secondary | ICD-10-CM

## 2023-04-21 DIAGNOSIS — O99283 Endocrine, nutritional and metabolic diseases complicating pregnancy, third trimester: Secondary | ICD-10-CM

## 2023-04-21 DIAGNOSIS — E079 Disorder of thyroid, unspecified: Secondary | ICD-10-CM

## 2023-04-21 DIAGNOSIS — D509 Iron deficiency anemia, unspecified: Secondary | ICD-10-CM

## 2023-04-21 DIAGNOSIS — O0993 Supervision of high risk pregnancy, unspecified, third trimester: Secondary | ICD-10-CM

## 2023-04-21 DIAGNOSIS — O99013 Anemia complicating pregnancy, third trimester: Secondary | ICD-10-CM

## 2023-04-21 NOTE — Progress Notes (Signed)
PRENATAL VISIT NOTE  Subjective:  Joan Kim is a 23 y.o. G2P0010 at [redacted]w[redacted]d being seen today for ongoing prenatal care.  She is currently monitored for the following issues for this high-risk pregnancy and has Hashimoto's thyroiditis; Vitamin D deficiency; Anxiety and depression; Iron deficiency; Migraine without aura and without status migrainosus, not intractable; Supervision of high-risk pregnancy; Nausea/vomiting in pregnancy; Near syncope; Family history of spina bifida; Mild intermittent asthma without complication; Thyroid dysfunction in pregnancy, antepartum; Gestational diabetes mellitus (GDM) affecting pregnancy, antepartum; and Anemia affecting pregnancy in third trimester on their problem list.  Patient reports  cramping in legs, abdomen and all over sometimes . Helped by Tylenol and Flexeril.  Contractions: Irregular. Vag. Bleeding: None.  Movement: Present. Denies leaking of fluid.   The following portions of the patient's history were reviewed and updated as appropriate: allergies, current medications, past family history, past medical history, past social history, past surgical history and problem list.   Objective:   Vitals:   04/21/23 0936  BP: 110/71  Pulse: 84  Weight: 191 lb (86.6 kg)    Fetal Status: Fetal Heart Rate (bpm): 140   Movement: Present     General:  Alert, oriented and cooperative. Patient is in no acute distress.  Skin: Skin is warm and dry. No rash noted.   Cardiovascular: Normal heart rate noted  Respiratory: Normal respiratory effort, no problems with respiration noted  Abdomen: Soft, gravid, appropriate for gestational age.  Pain/Pressure: Present     Pelvic: Cervical exam deferred        Extremities: Normal range of motion.  Edema: Trace  Mental Status: Normal mood and affect. Normal behavior. Normal judgment and thought content.    [place] MFM OB FOLLOW UP Result Date:  02/17/2023 ----------------------------------------------------------------------  OBSTETRICS REPORT                       (Signed Final 02/17/2023 03:04 pm) ---------------------------------------------------------------------- Patient Info  ID #:       [id number]                          D.O.B.:  2001-03-09 (22 yrs)(F)  Name:       Joan Kim               Visit Date: 02/17/2023 02:18 pm ---------------------------------------------------------------------- Performed By  Attending:        Noralee Space MD        Ref. Address:     [address]                                                             [address]  Performed By:     Anabel Halon          [facility]  Referred By:      [facility] ---------------------------------------------------------------------- Orders  #  Description                           Code        Ordered By  1  [place] MFM OB FOLLOW UP                   [phone]    Noralee Space ----------------------------------------------------------------------  #  Order #                     Accession #                Episode #  1  [id number]                   [id number]                 [id number] ---------------------------------------------------------------------- Indications  Hypothyroid (Hashimoto's - Levothyroxine)      O99.280 E03.9  Family history of congenital anomaly (Spina    Z82.79  Bifida)  [redacted] weeks gestation of pregnancy                Z3A.25  Antenatal follow-up for nonvisualized fetal    Z36.2  anatomy  LR NIPS - Female, Negative Horizon ---------------------------------------------------------------------- Fetal Evaluation  Num Of Fetuses:         1  Fetal Heart Rate(bpm):  148  Cardiac Activity:       Observed  Presentation:           Cephalic   Placenta:               Anterior  P. Cord Insertion:      Previously seen  Amniotic Fluid  AFI FV:      Within normal limits                              Largest Pocket(cm)                              4.47 ---------------------------------------------------------------------- Biometry  BPD:      64.4  mm     G. Age:  26w 0d         73  %    CI:        76.08   %    70 - 86                                                          FL/HC:      20.5   %    18.7 - 20.3  HC:       234   mm     G. Age:  25w 3d  38  %    HC/AC:      1.10        1.04 - 1.22  AC:      211.9  mm     G. Age:  25w 5d         59  %    FL/BPD:     74.5   %    71 - 87  FL:         48  mm     G. Age:  26w 1d         66  %    FL/AC:      22.7   %    20 - 24  LV:          3  mm  Est. FW:     858  gm    1 lb 14 oz      71  % ---------------------------------------------------------------------- OB History  Gravidity:    2         Term:   0        Prem:   0        SAB:   1  TOP:          0       Ectopic:  0        Living: 0 ---------------------------------------------------------------------- Gestational Age  LMP:           26w 0d        Date:  08/19/22                  EDD:   05/26/23  U/S Today:     25w 6d                                        EDD:   05/27/23  Best:          25w 1d     Det. By:  Previous Ultrasound      EDD:   06/01/23                                      (10/08/22) ---------------------------------------------------------------------- Targeted Anatomy  Central Nervous System  Calvarium/Cranial V.:  Appears normal         Cereb./Vermis:          Previously seen  Cavum:                 Previously seen        Sales executive:         Previously seen  Lateral Ventricles:    Previously seen        Midline Falx:           Previously seen  Choroid Plexus:        Previously seen  Spine  Cervical:              Previously seen        Sacral:                 Previously seen  Thoracic:              Previously seen        Shape/Curvature:         Previously seen  Lumbar:  Previously seen  Head/Neck  Lips:                  Previously seen        Profile:                Appears normal  Neck:                  Previously seen        Orbits/Eyes:            Previously seen  Nuchal Fold:           Previously seen        Mandible:               Previously seen  Nasal Bone:            Previously seen        Maxilla:                Previously seen  Thorax  4 Chamber View:        Appears normal         Interventr. Septum:     Previously seen  Cardiac Situs:         Previously seen        Cardiac Axis:           Previously een  Rt Outflow Tract:      Previously seen        Diaphragm:              Appears normal  Lt Outflow Tract:      Previously seen        3 Vessel View:          Previously seen  Aortic Arch:           Previously seen        3 V Trachea View:       Previously seen  Ductal Arch:           Appears normal         IVC:                    Previously seen  SVC:                   Previously seen        Crossing:               Previously seen  Abdomen  Ventral Wall:          Previously seen        Lt Kidney:              Appears normal  Cord Insertion:        Previously seen        Rt Kidney:              Appears normal  Situs:                 Previously seen        Bladder:                Appears normal  Stomach:               Appears normal  Extremities  Lt Humerus:            Previously seen        Lt Femur:  Previously seen  Rt Humerus:            Previously seen        Rt Femur:               Previously seen  Lt Forearm:            Previously seen        Lt Lower Leg:           Previously seen  Rt Forearm:            Previously seen        Rt Lower Leg:           Previously seen  Lt Hand:               Previously seen        Lt Foot:                Previously seen  Rt Hand:               Previously seen        Rt Foot:                Previously seen  Other  Umbilical Cord:        Previously seen        Genitalia:               Previously seen  Comment:     Technically difficult due to fetal position. ---------------------------------------------------------------------- Cervix Uterus Adnexa  Cervix  Not visualized (advanced GA >24wks) ---------------------------------------------------------------------- Impression  Patient returned for completion of fetal anatomy.  Fetal  growth is appropriate for gestational age.  Amniotic fluid is  normal and good fetal activity seen.  Fetal anatomical survey  was completed and appears normal.  We reassured the patient of the findings.  Patient has a history of hypothyroidism and recent TSH levels  are within normal range.  She is being followed by her  endocrinologist who recommended her daughter to take  levothyroxine supplements.  Repeat TSH levels will be checked in 6 weeks. ---------------------------------------------------------------------- Recommendations  Follow-up scans as clinically indicated. ----------------------------------------------------------------------                  Noralee Space, MD Electronically Signed Final Report   02/17/2023 03:04 pm ----------------------------------------------------------------------    Assessment and Plan:  Pregnancy: G2P0010 at [redacted]w[redacted]d 1. Diet controlled gestational diabetes mellitus (GDM) in third trimester (Primary) CBGs reviewed, within range. Continue diet control. Growth scan scheduled on 05/12/22. IOL 39-40 weeks.   2. Thyroid dysfunction in pregnancy, antepartum Normal recent labs, no meds.  3. Maternal iron deficiency anemia complicating pregnancy in third trimester Hgb 9.9 recently with ferritin 8. Accrufer prescribed.   4. [redacted] weeks gestation of pregnancy 5. Supervision of high risk pregnancy in third trimester Considering RSV vaccine, will decide by next visit.  Preterm labor symptoms and general obstetric precautions including but not limited to vaginal bleeding, contractions, leaking of fluid and fetal movement were reviewed  in detail with the patient. Please refer to After Visit Summary for other counseling recommendations.   Return in about 2 weeks (around 05/05/2023) for Pelvic cultures, OFFICE OB VISIT (MD only).  Future Appointments  Date Time Provider Department Center  05/05/2023  9:35 AM Scotty Pinder, Jethro Bastos, MD [facility]  05/12/2023  9:35 AM [place] Bing, MD [facility]  05/13/2023 10:15 AM [facility] NURSE [facility]  05/13/2023 10:30 AM [facility] US2 [facility]  05/19/2023  9:35 AM Tiah Heckel, Jethro Bastos, MD [facility]  05/26/2023  9:35 AM [place] Bing, MD [facility]  07/01/2023 10:20 AM Tobb, Lavona Mound, DO [facility] None    Jaynie Collins, MD

## 2023-05-05 ENCOUNTER — Encounter: Payer: Self-pay | Admitting: Obstetrics & Gynecology

## 2023-05-05 ENCOUNTER — Other Ambulatory Visit (HOSPITAL_COMMUNITY)
Admission: RE | Admit: 2023-05-05 | Discharge: 2023-05-05 | Disposition: A | Discharge status: Home / Self Care | Payer: Medicaid Other | Source: Ambulatory Visit | Attending: Obstetrics & Gynecology | Admitting: Obstetrics & Gynecology

## 2023-05-05 ENCOUNTER — Ambulatory Visit: Payer: Medicaid Other | Admitting: Obstetrics & Gynecology

## 2023-05-05 VITALS — BP 112/73 | HR 79 | Wt 194.0 lb

## 2023-05-05 DIAGNOSIS — O99280 Endocrine, nutritional and metabolic diseases complicating pregnancy, unspecified trimester: Secondary | ICD-10-CM | POA: Diagnosis not present

## 2023-05-05 DIAGNOSIS — O99013 Anemia complicating pregnancy, third trimester: Secondary | ICD-10-CM | POA: Diagnosis not present

## 2023-05-05 DIAGNOSIS — E079 Disorder of thyroid, unspecified: Secondary | ICD-10-CM | POA: Diagnosis not present

## 2023-05-05 DIAGNOSIS — O0993 Supervision of high risk pregnancy, unspecified, third trimester: Secondary | ICD-10-CM | POA: Diagnosis not present

## 2023-05-05 DIAGNOSIS — Z3A36 36 weeks gestation of pregnancy: Secondary | ICD-10-CM | POA: Diagnosis not present

## 2023-05-05 DIAGNOSIS — Z1339 Encounter for screening examination for other mental health and behavioral disorders: Secondary | ICD-10-CM | POA: Diagnosis not present

## 2023-05-05 DIAGNOSIS — D509 Iron deficiency anemia, unspecified: Secondary | ICD-10-CM | POA: Diagnosis not present

## 2023-05-05 DIAGNOSIS — O24410 Gestational diabetes mellitus in pregnancy, diet controlled: Secondary | ICD-10-CM

## 2023-05-05 NOTE — Patient Instructions (Signed)

## 2023-05-05 NOTE — Progress Notes (Signed)
   PRENATAL VISIT NOTE  Subjective:  Joan Kim is a 23 y.o. G2P0010 at [redacted]w[redacted]d being seen today for ongoing prenatal care.  She is currently monitored for the following issues for this high-risk pregnancy and has Hashimoto's thyroiditis; Vitamin D deficiency; Anxiety and depression; Iron  deficiency; Migraine without aura and without status migrainosus, not intractable; Supervision of high-risk pregnancy; Nausea/vomiting in pregnancy; Near syncope; Family history of spina bifida; Mild intermittent asthma without complication; Thyroid  dysfunction in pregnancy, antepartum; Gestational diabetes mellitus (GDM) affecting pregnancy, antepartum; and Anemia affecting pregnancy in third trimester on their problem list.  Patient reports no complaints.  Contractions: Irritability. Vag. Bleeding: None.  Movement: Present. Denies leaking of fluid.   The following portions of the patient's history were reviewed and updated as appropriate: allergies, current medications, past family history, past medical history, past social history, past surgical history and problem list.   Objective:   Vitals:   05/05/23 0945  BP: 112/73  Pulse: 79  Weight: 194 lb (88 kg)    Fetal Status: Fetal Heart Rate (bpm): 140 Fundal Height: 36 cm Movement: Present  Presentation: Vertex  General:  Alert, oriented and cooperative. Patient is in no acute distress.  Skin: Skin is warm and dry. No rash noted.   Cardiovascular: Normal heart rate noted  Respiratory: Normal respiratory effort, no problems with respiration noted  Abdomen: Soft, gravid, appropriate for gestational age.  Pain/Pressure: Present     Pelvic: Cervical exam performed in the presence of a chaperone Dilation: Closed Effacement (%): Thick Station: Ballotable, cultures done  Extremities: Normal range of motion.  Edema: Trace  Mental Status: Normal mood and affect. Normal behavior. Normal judgment and thought content.   Assessment and Plan:  Pregnancy:  G2P0010 at [redacted]w[redacted]d 1. Diet controlled gestational diabetes mellitus (GDM) in third trimester (Primary) Blood sugars within range. Continue diet control.  Growth scan scheduled 05/13/23.  2. Thyroid  dysfunction in pregnancy, antepartum Normal recent labs, no meds.   3. Maternal iron  deficiency anemia complicating pregnancy in third trimester Hgb 9.9 recently with ferritin 8. Accrufer  prescribed.   4. [redacted] weeks gestation of pregnancy 5. Supervision of high risk pregnancy in third trimester - Cervicovaginal ancillary only - Strep Gp B NAA Pelvic cultures done today, will follow up results and manage accordingly. Preterm labor symptoms and general obstetric precautions including but not limited to vaginal bleeding, contractions, leaking of fluid and fetal movement were reviewed in detail with the patient. Please refer to After Visit Summary for other counseling recommendations.   Return in about 1 week (around 05/12/2023) for OFFICE OB VISIT (MD only).  Future Appointments  Date Time Provider Department Center  05/12/2023  9:35 AM Izell Harari, MD [facility]  05/13/2023 10:15 AM [facility] NURSE [facility]  05/13/2023 10:30 AM [facility]  05/19/2023  9:35 AM Rodrickus Min, Gloris LABOR, MD [facility]  05/26/2023  9:35 AM Izell Harari, MD [facility]  07/01/2023 10:20 AM Tobb, Kardie, DO [facility] None    Gloris Hugger, MD

## 2023-05-06 LAB — CERVICOVAGINAL ANCILLARY ONLY
Chlamydia: NEGATIVE
Comment: NEGATIVE
Comment: NORMAL
Neisseria Gonorrhea: NEGATIVE

## 2023-05-07 LAB — STREP GP B NAA: Strep Gp B NAA: NEGATIVE

## 2023-05-09 ENCOUNTER — Encounter: Payer: Self-pay | Admitting: Obstetrics & Gynecology

## 2023-05-12 ENCOUNTER — Ambulatory Visit: Payer: Medicaid Other | Admitting: Obstetrics and Gynecology

## 2023-05-12 VITALS — BP 128/78 | HR 71 | Wt 197.0 lb

## 2023-05-12 DIAGNOSIS — O0993 Supervision of high risk pregnancy, unspecified, third trimester: Secondary | ICD-10-CM

## 2023-05-12 DIAGNOSIS — E079 Disorder of thyroid, unspecified: Secondary | ICD-10-CM | POA: Diagnosis not present

## 2023-05-12 DIAGNOSIS — O368130 Decreased fetal movements, third trimester, not applicable or unspecified: Secondary | ICD-10-CM | POA: Diagnosis not present

## 2023-05-12 DIAGNOSIS — O99280 Endocrine, nutritional and metabolic diseases complicating pregnancy, unspecified trimester: Secondary | ICD-10-CM

## 2023-05-12 DIAGNOSIS — O99013 Anemia complicating pregnancy, third trimester: Secondary | ICD-10-CM | POA: Diagnosis not present

## 2023-05-12 DIAGNOSIS — O24419 Gestational diabetes mellitus in pregnancy, unspecified control: Secondary | ICD-10-CM | POA: Diagnosis not present

## 2023-05-12 DIAGNOSIS — Z3A37 37 weeks gestation of pregnancy: Secondary | ICD-10-CM

## 2023-05-12 NOTE — Progress Notes (Signed)
   PRENATAL VISIT NOTE  Subjective:  Joan Kim is a 23 y.o. G2P0010 at [redacted]w[redacted]d being seen today for ongoing prenatal care.  She is currently monitored for the following issues for this high-risk pregnancy and has Hashimoto's thyroiditis; Vitamin D deficiency; Anxiety and depression; Iron deficiency; Supervision of high-risk pregnancy; Near syncope; Family history of spina bifida; Mild intermittent asthma without complication; Thyroid dysfunction in pregnancy, antepartum; Gestational diabetes mellitus (GDM) affecting pregnancy, antepartum; and Anemia affecting pregnancy in third trimester on their problem list.  Patient reports  decreased FM today .  Contractions: Irritability. Vag. Bleeding: None.  Movement: Absent. Denies leaking of fluid.   The following portions of the patient's history were reviewed and updated as appropriate: allergies, current medications, past family history, past medical history, past social history, past surgical history and problem list.   Objective:   Vitals:   05/12/23 0949  BP: 128/78  Pulse: 71  Weight: 197 lb (89.4 kg)    Fetal Status: Fetal Heart Rate (bpm): NST   Movement: Absent     General:  Alert, oriented and cooperative. Patient is in no acute distress.  Skin: Skin is warm and dry. No rash noted.   Cardiovascular: Normal heart rate noted  Respiratory: Normal respiratory effort, no problems with respiration noted  Abdomen: Soft, gravid, appropriate for gestational age.  Pain/Pressure: Present     Pelvic: Cervical exam deferred        Extremities: Normal range of motion.  Edema: Trace  Mental Status: Normal mood and affect. Normal behavior. Normal judgment and thought content.   Assessment and Plan:  Pregnancy: G2P0010 at [redacted]w[redacted]d 1. Gestational diabetes mellitus (GDM) affecting pregnancy, antepartum (Primary) Normal CBG log. F/u growth u/s tomorrow. D/w her re: setting up 39-40wk IOL and pt would like to wait for next week  2. Thyroid  dysfunction in pregnancy, antepartum On no meds. Normal TFTs early january  3. Supervision of high risk pregnancy in third trimester GBS neg  4. Decreased fetal movements in third trimester, single or unspecified fetus Reactive NST and approx q4-67m contractions, which patient felt but not painful. Patient feeling fetal movement now. FKC precautions given - Fetal nonstress test  5. Anemia affecting pregnancy in third trimester Continue iron  Term labor symptoms and general obstetric precautions including but not limited to vaginal bleeding, contractions, leaking of fluid and fetal movement were reviewed in detail with the patient. Please refer to After Visit Summary for other counseling recommendations.   No follow-ups on file.  Future Appointments  Date Time Provider Department Center  05/13/2023 10:15 AM [facility] NURSE [facility]  05/13/2023 10:30 AM [facility] US4 [facility]  05/19/2023  9:35 AM Anyanwu, Jethro Bastos, MD [facility]  05/26/2023  9:35 AM Rolla Bing, MD [facility]  07/01/2023 10:20 AM Tobb, Lavona Mound, DO [facility] None    Itmann Bing, MD

## 2023-05-12 NOTE — Patient Instructions (Signed)
Continue to check your blood pressures once a day or every other day Call the office for blood pressures that are consistently above 140 for the top number or 90 for the bottom number   Hypertension During Pregnancy Hypertension is also called high blood pressure. High blood pressure means that the force of your blood moving in your body is too strong. It can cause problems for you and your baby. Different types of high blood pressure can happen during pregnancy. The types are: High blood pressure before you got pregnant. This is called chronic hypertension.  This can continue during your pregnancy. Your doctor will want to keep checking your blood pressure. You may need medicine to keep your blood pressure under control while you are pregnant. You will need follow-up visits after you have your baby. High blood pressure that goes up during pregnancy when it was normal before. This is called gestational hypertension. It will usually get better after you have your baby, but your doctor will need to watch your blood pressure to make sure that it is getting better. Very high blood pressure during pregnancy. This is called preeclampsia. Very high blood pressure is an emergency that needs to be checked and treated right away. You may develop very high blood pressure after giving birth. This is called postpartum preeclampsia. This usually occurs within 48 hours after childbirth but may occur up to 6 weeks after giving birth. This is rare. How does this affect me? If you have high blood pressure during pregnancy, you have a higher chance of developing high blood pressure: As you get older. If you get pregnant again. In some cases, high blood pressure during pregnancy can cause: Stroke. Heart attack. Damage to the kidneys, lungs, or liver. Preeclampsia. Jerky movements you cannot control (convulsions or seizures). Problems with the placenta.  What can I do to lower my risk?  Keep a healthy  weight. Eat a healthy diet. Follow what your doctor tells you about treating any medical problems that you had before becoming pregnant. It is very important to go to all of your doctor visits. Your doctor will check your blood pressure and make sure that your pregnancy is progressing as it should. Treatment should start early if a problem is found.  Follow these instructions at home:  Take your blood pressure 1-2 times per day. Call the office if your blood pressure is 155 or higher for the top number or 105 or higher for the bottom number.    Eating and drinking  Drink enough fluid to keep your pee (urine) pale yellow. Avoid caffeine. Lifestyle Do not use any products that contain nicotine or tobacco, such as cigarettes, e-cigarettes, and chewing tobacco. If you need help quitting, ask your doctor. Do not use alcohol or drugs. Avoid stress. Rest and get plenty of sleep. Regular exercise can help. Ask your doctor what kinds of exercise are best for you. General instructions Take over-the-counter and prescription medicines only as told by your doctor. Keep all prenatal and follow-up visits as told by your doctor. This is important. Contact a doctor if: You have symptoms that your doctor told you to watch for, such as: Headaches. Nausea. Vomiting. Belly (abdominal) pain. Dizziness. Light-headedness. Get help right away if: You have: Very bad belly pain that does not get better with treatment. A very bad headache that does not get better. Vomiting that does not get better. Sudden, fast weight gain. Sudden swelling in your hands, ankles, or face. Blood in your pee.  Blurry vision. Double vision. Shortness of breath. Chest pain. Weakness on one side of your body. Trouble talking. Summary High blood pressure is also called hypertension. High blood pressure means that the force of your blood moving in your body is too strong. High blood pressure can cause problems for you and  your baby. Keep all follow-up visits as told by your doctor. This is important. This information is not intended to replace advice given to you by your health care provider. Make sure you discuss any questions you have with your health care provider. Document Released: 04/17/2010 Document Revised: 07/06/2018 Document Reviewed: 04/11/2018 Elsevier Patient Education  2020 [employer].

## 2023-05-12 NOTE — Progress Notes (Signed)
ROB   CC: No Fetal movement at all this morning.

## 2023-05-13 ENCOUNTER — Other Ambulatory Visit: Payer: Self-pay

## 2023-05-13 ENCOUNTER — Encounter: Payer: Self-pay | Admitting: Obstetrics & Gynecology

## 2023-05-13 ENCOUNTER — Ambulatory Visit: Payer: Medicaid Other | Admitting: *Deleted

## 2023-05-13 ENCOUNTER — Ambulatory Visit: Payer: Medicaid Other | Attending: Obstetrics & Gynecology

## 2023-05-13 VITALS — BP 117/71 | HR 87

## 2023-05-13 DIAGNOSIS — E039 Hypothyroidism, unspecified: Secondary | ICD-10-CM | POA: Diagnosis not present

## 2023-05-13 DIAGNOSIS — Z3A37 37 weeks gestation of pregnancy: Secondary | ICD-10-CM | POA: Diagnosis not present

## 2023-05-13 DIAGNOSIS — O99280 Endocrine, nutritional and metabolic diseases complicating pregnancy, unspecified trimester: Secondary | ICD-10-CM | POA: Insufficient documentation

## 2023-05-13 DIAGNOSIS — O352XX0 Maternal care for (suspected) hereditary disease in fetus, not applicable or unspecified: Secondary | ICD-10-CM

## 2023-05-13 DIAGNOSIS — E079 Disorder of thyroid, unspecified: Secondary | ICD-10-CM

## 2023-05-13 DIAGNOSIS — O0993 Supervision of high risk pregnancy, unspecified, third trimester: Secondary | ICD-10-CM

## 2023-05-13 DIAGNOSIS — O24419 Gestational diabetes mellitus in pregnancy, unspecified control: Secondary | ICD-10-CM | POA: Diagnosis present

## 2023-05-13 DIAGNOSIS — O99283 Endocrine, nutritional and metabolic diseases complicating pregnancy, third trimester: Secondary | ICD-10-CM | POA: Diagnosis not present

## 2023-05-13 DIAGNOSIS — O3663X0 Maternal care for excessive fetal growth, third trimester, not applicable or unspecified: Secondary | ICD-10-CM | POA: Insufficient documentation

## 2023-05-13 DIAGNOSIS — O24410 Gestational diabetes mellitus in pregnancy, diet controlled: Secondary | ICD-10-CM | POA: Diagnosis not present

## 2023-05-13 DIAGNOSIS — E063 Autoimmune thyroiditis: Secondary | ICD-10-CM | POA: Diagnosis not present

## 2023-05-15 ENCOUNTER — Encounter: Payer: Self-pay | Admitting: Obstetrics & Gynecology

## 2023-05-16 ENCOUNTER — Ambulatory Visit (INDEPENDENT_AMBULATORY_CARE_PROVIDER_SITE_OTHER): Payer: Medicaid Other

## 2023-05-16 VITALS — BP 127/79 | HR 74 | Wt 200.0 lb

## 2023-05-16 DIAGNOSIS — O133 Gestational [pregnancy-induced] hypertension without significant proteinuria, third trimester: Secondary | ICD-10-CM

## 2023-05-16 DIAGNOSIS — Z0130 Encounter for examination of blood pressure without abnormal findings: Secondary | ICD-10-CM

## 2023-05-16 NOTE — Progress Notes (Signed)
Subjective:  Joan Kim is a 23 y.o. female here for BP check.   Hypertension ROS: Patient c/o headaches, notes visual symptoms, swelling in both ankles that is painful, no RUQ/epigastric pain.  Objective:  LMP 08/19/2022   Appearance alert, well appearing, and in no distress. General exam BP noted to be 127/79 today in office.    Assessment:   Blood Pressure : Needs Evaluation.  Plan:  PIH Labs and repeat B/P this week. Pt is to keep scheduled appt.  Marland Kitchen

## 2023-05-17 ENCOUNTER — Encounter: Payer: Self-pay | Admitting: Family Medicine

## 2023-05-17 LAB — COMPREHENSIVE METABOLIC PANEL
ALT: 11 [IU]/L (ref 0–32)
AST: 29 [IU]/L (ref 0–40)
Albumin: 3.5 g/dL — ABNORMAL LOW (ref 4.0–5.0)
Alkaline Phosphatase: 260 [IU]/L — ABNORMAL HIGH (ref 44–121)
BUN/Creatinine Ratio: 6 — ABNORMAL LOW (ref 9–23)
BUN: 3 mg/dL — ABNORMAL LOW (ref 6–20)
Bilirubin Total: 0.8 mg/dL (ref 0.0–1.2)
CO2: 18 mmol/L — ABNORMAL LOW (ref 20–29)
Calcium: 8.9 mg/dL (ref 8.7–10.2)
Chloride: 105 mmol/L (ref 96–106)
Creatinine, Ser: 0.47 mg/dL — ABNORMAL LOW (ref 0.57–1.00)
Globulin, Total: 2.7 g/dL (ref 1.5–4.5)
Glucose: 72 mg/dL (ref 70–99)
Potassium: 3.6 mmol/L (ref 3.5–5.2)
Sodium: 141 mmol/L (ref 134–144)
Total Protein: 6.2 g/dL (ref 6.0–8.5)
eGFR: 137 mL/min/{1.73_m2} (ref >59)

## 2023-05-17 LAB — CBC
Hematocrit: 34.2 % (ref 34.0–46.6)
Hemoglobin: 10.8 g/dL — ABNORMAL LOW (ref 11.1–15.9)
MCH: 24.8 pg — ABNORMAL LOW (ref 26.6–33.0)
MCHC: 31.6 g/dL (ref 31.5–35.7)
MCV: 79 fL (ref 79–97)
Platelets: 261 10*3/uL (ref 150–450)
RBC: 4.35 x10E6/uL (ref 3.77–5.28)
RDW: 14.5 % (ref 11.7–15.4)
WBC: 8.8 10*3/uL (ref 3.4–10.8)

## 2023-05-17 LAB — PROTEIN / CREATININE RATIO, URINE
Creatinine, Urine: 78 mg/dL
Protein, Ur: 45.4 mg/dL
Protein/Creat Ratio: 582 mg/g{creat} — ABNORMAL HIGH (ref 0–200)

## 2023-05-18 ENCOUNTER — Telehealth: Payer: Self-pay | Admitting: *Deleted

## 2023-05-18 ENCOUNTER — Inpatient Hospital Stay (HOSPITAL_COMMUNITY)
Admission: AD | Admit: 2023-05-18 | Discharge: 2023-05-23 | DRG: 787 | Disposition: A | Discharge status: Home / Self Care | Payer: Medicaid Other | Attending: Obstetrics and Gynecology | Admitting: Obstetrics and Gynecology

## 2023-05-18 ENCOUNTER — Encounter (HOSPITAL_COMMUNITY): Payer: Self-pay | Admitting: Obstetrics and Gynecology

## 2023-05-18 DIAGNOSIS — Z3A38 38 weeks gestation of pregnancy: Secondary | ICD-10-CM

## 2023-05-18 DIAGNOSIS — F419 Anxiety disorder, unspecified: Secondary | ICD-10-CM | POA: Diagnosis present

## 2023-05-18 DIAGNOSIS — O24420 Gestational diabetes mellitus in childbirth, diet controlled: Secondary | ICD-10-CM | POA: Diagnosis not present

## 2023-05-18 DIAGNOSIS — O3663X0 Maternal care for excessive fetal growth, third trimester, not applicable or unspecified: Secondary | ICD-10-CM | POA: Diagnosis present

## 2023-05-18 DIAGNOSIS — K219 Gastro-esophageal reflux disease without esophagitis: Secondary | ICD-10-CM | POA: Diagnosis present

## 2023-05-18 DIAGNOSIS — Z79899 Other long term (current) drug therapy: Secondary | ICD-10-CM

## 2023-05-18 DIAGNOSIS — O9081 Anemia of the puerperium: Secondary | ICD-10-CM | POA: Diagnosis not present

## 2023-05-18 DIAGNOSIS — O1404 Mild to moderate pre-eclampsia, complicating childbirth: Secondary | ICD-10-CM | POA: Diagnosis not present

## 2023-05-18 DIAGNOSIS — O9962 Diseases of the digestive system complicating childbirth: Secondary | ICD-10-CM | POA: Diagnosis present

## 2023-05-18 DIAGNOSIS — O99284 Endocrine, nutritional and metabolic diseases complicating childbirth: Secondary | ICD-10-CM | POA: Diagnosis not present

## 2023-05-18 DIAGNOSIS — Z8249 Family history of ischemic heart disease and other diseases of the circulatory system: Secondary | ICD-10-CM | POA: Diagnosis not present

## 2023-05-18 DIAGNOSIS — O9952 Diseases of the respiratory system complicating childbirth: Secondary | ICD-10-CM | POA: Diagnosis present

## 2023-05-18 DIAGNOSIS — J4520 Mild intermittent asthma, uncomplicated: Secondary | ICD-10-CM | POA: Diagnosis not present

## 2023-05-18 DIAGNOSIS — E876 Hypokalemia: Secondary | ICD-10-CM | POA: Diagnosis present

## 2023-05-18 DIAGNOSIS — O24419 Gestational diabetes mellitus in pregnancy, unspecified control: Secondary | ICD-10-CM | POA: Diagnosis present

## 2023-05-18 DIAGNOSIS — R319 Hematuria, unspecified: Secondary | ICD-10-CM | POA: Diagnosis not present

## 2023-05-18 DIAGNOSIS — D62 Acute posthemorrhagic anemia: Secondary | ICD-10-CM | POA: Diagnosis not present

## 2023-05-18 DIAGNOSIS — Z98891 History of uterine scar from previous surgery: Secondary | ICD-10-CM

## 2023-05-18 DIAGNOSIS — O99344 Other mental disorders complicating childbirth: Secondary | ICD-10-CM | POA: Diagnosis not present

## 2023-05-18 DIAGNOSIS — Z833 Family history of diabetes mellitus: Secondary | ICD-10-CM

## 2023-05-18 DIAGNOSIS — O1400 Mild to moderate pre-eclampsia, unspecified trimester: Secondary | ICD-10-CM

## 2023-05-18 DIAGNOSIS — Z91040 Latex allergy status: Secondary | ICD-10-CM | POA: Diagnosis not present

## 2023-05-18 DIAGNOSIS — F32A Depression, unspecified: Secondary | ICD-10-CM | POA: Diagnosis not present

## 2023-05-18 DIAGNOSIS — E079 Disorder of thyroid, unspecified: Secondary | ICD-10-CM | POA: Diagnosis present

## 2023-05-18 DIAGNOSIS — O0990 Supervision of high risk pregnancy, unspecified, unspecified trimester: Secondary | ICD-10-CM

## 2023-05-18 HISTORY — DX: Mild to moderate pre-eclampsia, unspecified trimester: O14.00

## 2023-05-18 LAB — COMPREHENSIVE METABOLIC PANEL
ALT: 13 U/L (ref 0–44)
AST: 25 U/L (ref 15–41)
Albumin: 2.4 g/dL — ABNORMAL LOW (ref 3.5–5.0)
Alkaline Phosphatase: 173 U/L — ABNORMAL HIGH (ref 38–126)
Anion gap: 12 (ref 5–15)
BUN: <5 mg/dL — ABNORMAL LOW (ref 6–20)
CO2: 19 mmol/L — ABNORMAL LOW (ref 22–32)
Calcium: 8.2 mg/dL — ABNORMAL LOW (ref 8.9–10.3)
Chloride: 106 mmol/L (ref 98–111)
Creatinine, Ser: 0.46 mg/dL (ref 0.44–1.00)
GFR, Estimated: >60 mL/min (ref >60)
Glucose, Bld: 77 mg/dL (ref 70–99)
Potassium: 3.1 mmol/L — ABNORMAL LOW (ref 3.5–5.1)
Sodium: 137 mmol/L (ref 135–145)
Total Bilirubin: 0.9 mg/dL (ref 0.0–1.2)
Total Protein: 5.7 g/dL — ABNORMAL LOW (ref 6.5–8.1)

## 2023-05-18 LAB — GLUCOSE, CAPILLARY
Glucose-Capillary: 70 mg/dL (ref 70–99)
Glucose-Capillary: 102 mg/dL — ABNORMAL HIGH (ref 70–99)

## 2023-05-18 LAB — CBC
HCT: 29.3 % — ABNORMAL LOW (ref 36.0–46.0)
Hemoglobin: 9.5 g/dL — ABNORMAL LOW (ref 12.0–15.0)
MCH: 25.1 pg — ABNORMAL LOW (ref 26.0–34.0)
MCHC: 32.4 g/dL (ref 30.0–36.0)
MCV: 77.3 fL — ABNORMAL LOW (ref 80.0–100.0)
Platelets: 217 10*3/uL (ref 150–400)
RBC: 3.79 MIL/uL — ABNORMAL LOW (ref 3.87–5.11)
RDW: 15.2 % (ref 11.5–15.5)
WBC: 8 10*3/uL (ref 4.0–10.5)
nRBC: 0 % (ref 0.0–0.2)

## 2023-05-18 LAB — PROTEIN / CREATININE RATIO, URINE
Creatinine, Urine: 75 mg/dL
Protein Creatinine Ratio: 0.21 mg/mg{creat} — ABNORMAL HIGH (ref 0.00–0.15)
Total Protein, Urine: 16 mg/dL

## 2023-05-18 MED ORDER — OXYTOCIN-SODIUM CHLORIDE 30-0.9 UT/500ML-% IV SOLN
2.5000 [IU]/h | INTRAVENOUS | Status: DC
Start: 2023-05-18 — End: 2023-05-20
  Filled 2023-05-18: qty 500

## 2023-05-18 MED ORDER — MISOPROSTOL 50MCG HALF TABLET
50.0000 ug | ORAL_TABLET | Freq: Once | ORAL | Status: AC
  Administered 2023-05-18: 50 ug via ORAL
  Filled 2023-05-18: qty 1

## 2023-05-18 MED ORDER — POTASSIUM CHLORIDE CRYS ER 20 MEQ PO TBCR
20.0000 meq | EXTENDED_RELEASE_TABLET | Freq: Two times a day (BID) | ORAL | Status: DC
Start: 2023-05-18 — End: 2023-05-20
  Administered 2023-05-18 – 2023-05-19 (×3): 20 meq via ORAL
  Filled 2023-05-18 (×4): qty 1

## 2023-05-18 MED ORDER — ZOLPIDEM TARTRATE 5 MG PO TABS: 5.0000 mg | ORAL_TABLET | Freq: Every evening | ORAL | Status: DC | PRN

## 2023-05-18 MED ORDER — ACETAMINOPHEN 325 MG PO TABS
650.0000 mg | ORAL_TABLET | ORAL | Status: DC | PRN
  Administered 2023-05-18: 650 mg via ORAL
  Filled 2023-05-18: qty 2

## 2023-05-18 MED ORDER — MISOPROSTOL 25 MCG QUARTER TABLET
25.0000 ug | ORAL_TABLET | Freq: Once | ORAL | Status: AC
  Administered 2023-05-18: 25 ug via VAGINAL
  Filled 2023-05-18: qty 1

## 2023-05-18 MED ORDER — FENTANYL CITRATE (PF) 100 MCG/2ML IJ SOLN
100.0000 ug | INTRAMUSCULAR | Status: DC | PRN
  Administered 2023-05-19 (×9): 100 ug via INTRAVENOUS
  Filled 2023-05-18 (×9): qty 2

## 2023-05-18 MED ORDER — SOD CITRATE-CITRIC ACID 500-334 MG/5ML PO SOLN
30.0000 mL | ORAL | Status: DC | PRN
  Administered 2023-05-20: 30 mL via ORAL
  Filled 2023-05-18: qty 30

## 2023-05-18 MED ORDER — OXYTOCIN BOLUS FROM INFUSION: 333.0000 mL | Freq: Once | INTRAVENOUS | Status: DC

## 2023-05-18 MED ORDER — ONDANSETRON HCL 4 MG/2ML IJ SOLN
4.0000 mg | Freq: Four times a day (QID) | INTRAMUSCULAR | Status: DC | PRN
  Administered 2023-05-19 – 2023-05-20 (×2): 4 mg via INTRAVENOUS
  Filled 2023-05-18: qty 2

## 2023-05-18 MED ORDER — LACTATED RINGERS IV SOLN: 500.0000 mL | INTRAVENOUS | Status: AC | PRN

## 2023-05-18 MED ORDER — TERBUTALINE SULFATE 1 MG/ML IJ SOLN: 0.2500 mg | Freq: Once | INTRAMUSCULAR | Status: DC | PRN

## 2023-05-18 MED ORDER — LIDOCAINE HCL (PF) 1 % IJ SOLN: 30.0000 mL | INTRAMUSCULAR | Status: DC | PRN

## 2023-05-18 MED ORDER — MISOPROSTOL 50MCG HALF TABLET
50.0000 ug | ORAL_TABLET | ORAL | Status: DC
  Administered 2023-05-19: 50 ug via ORAL
  Filled 2023-05-18 (×2): qty 1

## 2023-05-18 MED ORDER — LACTATED RINGERS IV SOLN: INTRAVENOUS | Status: AC

## 2023-05-18 NOTE — H&P (Addendum)
LABOR AND DELIVERY ADMISSION HISTORY AND PHYSICAL NOTE  Joan Kim is a 23 y.o. female G2P0010 with IUP at [redacted]w[redacted]d by 6 wk u/s presenting for IOL for mild pre-eclampsia.   She reports positive fetal movement. She denies leakage of fluid or vaginal bleeding.  Denies headache at this time. No vision change, dyspnea, upper abd pain.  Prenatal History/Complications:  Past Medical History: Past Medical History:  Diagnosis Date   Asthma    Bacterial vaginitis    Dyspepsia 08/30/2019   Fatigue 08/12/2020   GERD (gastroesophageal reflux disease)    H/O bladder infections    Helicobacter pylori antibody positive 03/2019   Insomnia 09/18/2020   Migraine without aura and without status migrainosus, not intractable 04/23/2020   Nausea/vomiting in pregnancy 10/22/2022    Past Surgical History: Past Surgical History:  Procedure Laterality Date   NO PAST SURGERIES      Obstetrical History: OB History     Gravida  2   Para  0   Term  0   Preterm  0   AB  1   Living  0      SAB  1   IAB  0   Ectopic  0   Multiple  0   Live Births  0           Social History: Social History   Socioeconomic History   Marital status: Single    Spouse name: Not on file   Number of children: Not on file   Years of education: Not on file   Highest education level: Not on file  Occupational History   Occupation: Student  Tobacco Use   Smoking status: Never    Passive exposure: Yes   Smokeless tobacco: Never  Vaping Use   Vaping status: Never Used  Substance and Sexual Activity   Alcohol use: Not Currently   Drug use: Never   Sexual activity: Yes    Partners: Male  Other Topics Concern   Not on file  Social History Narrative   Single lives with her parents splits time between each layer divorced.   Graduate [place] 2020   Working for [employer]   No EtOH, tobacco, caffeine, drugs   Social Drivers of Corporate investment banker Strain: Not on file   Food Insecurity: No Food Insecurity (03/16/2023)   Hunger Vital Sign    Worried About Running Out of Food in the Last Year: Never true    Ran Out of Food in the Last Year: Never true  Transportation Needs: No Transportation Needs (11/14/2022)   PRAPARE - Administrator, Civil Service (Medical): No    Lack of Transportation (Non-Medical): No  Physical Activity: Not on file  Stress: Not on file  Social Connections: Not on file    Family History: Family History  Problem Relation Age of Onset   Emphysema Maternal Uncle    Hypertension Maternal Grandmother    Diabetes Paternal Grandmother    Hypertension Paternal Grandfather    Heart disease Paternal Grandfather    Bladder Cancer Paternal Grandfather    Irritable bowel syndrome Other    Colon cancer Neg Hx    Esophageal cancer Neg Hx    Rectal cancer Neg Hx    Stomach cancer Neg Hx     Allergies: Allergies  Allergen Reactions   Latex Rash    Medications Prior to Admission  Medication Sig Dispense Refill Last Dose/Taking   Accu-Chek Softclix Lancets lancets  1 each by Other route 4 (four) times daily. 100 each 12    acetaminophen (TYLENOL) 325 MG tablet Take 650 mg by mouth every 6 (six) hours as needed.      albuterol (VENTOLIN HFA) 108 (90 Base) MCG/ACT inhaler INHALE 2 PUFFS INTO THE LUNGS EVERY 4 HOURS AS NEEDED FOR WHEEZING OR SHORTNESS OF BREATH. 18 each 3    Blood Glucose Monitoring Suppl (ACCU-CHEK GUIDE) w/Device KIT 1 Device by Does not apply route 4 (four) times daily. 1 kit 0    busPIRone (BUSPAR) 5 MG tablet Take 5 mg by mouth 3 (three) times daily.      cyclobenzaprine (FLEXERIL) 5 MG tablet Take 1 tablet (5 mg total) by mouth every 8 (eight) hours as needed. (Patient not taking: Reported on 05/13/2023) 30 tablet 0    famotidine (PEPCID) 20 MG tablet Take 1 tablet (20 mg total) by mouth 2 (two) times daily. (Patient not taking: Reported on 05/05/2023) 60 tablet 6    Ferric Maltol 30 MG CAPS Take 1 capsule  (30 mg total) by mouth 2 (two) times daily. Please take one hour before breakfast and dinner 60 capsule 2    glucose blood test strip Use as instructed 100 each 12    Prenatal Multivit-Min-Fe-FA (PRENATAL/IRON) TABS Take 1 tablet by mouth daily. 90 tablet 3    promethazine (PHENERGAN) 25 MG tablet Take 25 mg by mouth every 6 (six) hours as needed for nausea or vomiting.      sertraline (ZOLOFT) 100 MG tablet Take 1 tablet (100 mg total) by mouth daily. 30 tablet 6      Review of Systems   All systems reviewed and negative except as stated in HPI  Blood pressure 123/78, pulse 88, temperature 98.1 F (36.7 C), temperature source Oral, resp. rate 17, height 5\' 6"  (1.676 m), weight 91.8 kg, last menstrual period 08/19/2022, SpO2 100%, unknown if currently breastfeeding. General appearance: NAD Lungs: clear to auscultation bilaterally Heart: regular rate and rhythm Abdomen: soft, non-tender; bowel sounds normal Extremities: No calf swelling or tenderness Presentation: cephalic Fetal monitoring: baseline 130/moderate variability/+ accels/-decels Cervical exam: 0.5/thick/-3 Uterine activity: uterine irritabiliy     Prenatal labs: ABO, Rh: A/Positive/-- (07/26 0454) Antibody: Negative (07/26 0858) Rubella: 1.03 (07/26 0858) RPR: Non Reactive (12/12 1352)  HBsAg: Negative (07/26 0858)  HIV: Non Reactive (12/12 1352)  GBS: Negative/-- (02/06 1030)  2 hr Glucola: 1/3 abnormal Genetic screening:  wnl Anatomy US: wnl  Prenatal Transfer Tool  Maternal Diabetes: Yes:  Diabetes Type:  Diet controlled Genetic Screening: Normal Maternal Ultrasounds/Referrals: Normal Fetal Ultrasounds or other Referrals:  None Maternal Substance Abuse:  No Significant Maternal Medications:  zoloft, buspar Significant Maternal Lab Results: Group B Strep negative  No results found for this or any previous visit (from the past 24 hours).  Patient Active Problem List   Diagnosis Date Noted   LGA (large  for gestational age) fetus affecting management of mother, third trimester 05/13/2023   Anemia affecting pregnancy in third trimester 04/07/2023   Gestational diabetes mellitus (GDM) affecting pregnancy, antepartum 03/11/2023   Thyroid dysfunction in pregnancy, antepartum 01/13/2023   Family history of spina bifida 11/18/2022   Mild intermittent asthma without complication 11/18/2022   Near syncope 11/03/2022   Supervision of high-risk pregnancy 10/22/2022   Iron deficiency 12/26/2019   Anxiety and depression 10/04/2019   Hashimoto's thyroiditis 08/30/2019   Vitamin D deficiency 08/30/2019    Assessment: Maddi D Godfrey is a 23 y.o. G2P0010 at 103w0d  here for IOL for mild preE. Upc elevated on 2/17 and has had elevated bp at home. Mild ha on arrival but says this is normal for her, no other symptoms.  # preE: labs pending, at this time appears to be mild, holding on mg  # Asthma: mild intermittent  # History hypothyroidism: says was on levothyroxine early in pregnancy, now off, most recent tfts wnl, will need repeat TFTs 6 wks PP  # LGA fetus: noted, efw is less than threshold for cesarean  # A1gdm: reports good control at home, will monitor sugars here  #Labor: S/p dual cytotec. Cervix not yet favorable for FB. Plan for another dose of cytotec around 0200 and hopeful for FB around 0600.  #Pain: Eventual epidural #FWB: Category I tracing #ID:  Gbs neg #MOF: bottle #MOC: nothing (counseling provided) #Circ:  N/a   Claria Dice, MD Family Medicine Resident, PGY-1  05/18/2023 10:43 PM

## 2023-05-18 NOTE — MAU Note (Signed)
Rosio D Fredenburg is a 23 y.o. at [redacted]w[redacted]d here in MAU reporting: got a call from her dr to be induced today. They don't have a bed, so she is here.   "Has a lot of protein in her urine..". +HA, just started when she got here, hadn't taken anything . Denies visual changes or epigastric pain. Reports increase in swelling in hands and feet.. Denies bleeding or LOF.  Reports +FM.  Onset of complaint: today Pain score: tightness in abd and back- moderate, HA 3 Vitals:   05/18/23 1411  BP: 123/78  Pulse: 88  Resp: 17  Temp: 98.1 F (36.7 C)  SpO2: 100%     FHT:160 Lab orders placed from triage:

## 2023-05-18 NOTE — Telephone Encounter (Signed)
Called pt regarding her lab results and that she is being direct admitted.Informed that labor and delivery is full and has patients holding so she will be going to the MAU to be watched until they get a bed. Pt verbalizes and understands and will head to the hospital shortly.   Dr Shawnie Pons made MAU and L& D aware of admission.

## 2023-05-19 ENCOUNTER — Encounter (HOSPITAL_COMMUNITY): Payer: Self-pay | Admitting: Obstetrics and Gynecology

## 2023-05-19 ENCOUNTER — Encounter: Payer: Medicaid Other | Admitting: Obstetrics & Gynecology

## 2023-05-19 ENCOUNTER — Inpatient Hospital Stay (HOSPITAL_COMMUNITY): Payer: Medicaid Other | Admitting: Anesthesiology

## 2023-05-19 DIAGNOSIS — Z3A38 38 weeks gestation of pregnancy: Secondary | ICD-10-CM | POA: Diagnosis not present

## 2023-05-19 LAB — BASIC METABOLIC PANEL
Anion gap: 10 (ref 5–15)
BUN: <5 mg/dL — ABNORMAL LOW (ref 6–20)
CO2: 19 mmol/L — ABNORMAL LOW (ref 22–32)
Calcium: 8.6 mg/dL — ABNORMAL LOW (ref 8.9–10.3)
Chloride: 108 mmol/L (ref 98–111)
Creatinine, Ser: 0.45 mg/dL (ref 0.44–1.00)
GFR, Estimated: >60 mL/min (ref >60)
Glucose, Bld: 98 mg/dL (ref 70–99)
Potassium: 3.7 mmol/L (ref 3.5–5.1)
Sodium: 137 mmol/L (ref 135–145)

## 2023-05-19 LAB — CBC
HCT: 29 % — ABNORMAL LOW (ref 36.0–46.0)
HCT: 30.5 % — ABNORMAL LOW (ref 36.0–46.0)
Hemoglobin: 9.4 g/dL — ABNORMAL LOW (ref 12.0–15.0)
Hemoglobin: 9.7 g/dL — ABNORMAL LOW (ref 12.0–15.0)
MCH: 25.1 pg — ABNORMAL LOW (ref 26.0–34.0)
MCH: 24.7 pg — ABNORMAL LOW (ref 26.0–34.0)
MCHC: 32.4 g/dL (ref 30.0–36.0)
MCHC: 31.8 g/dL (ref 30.0–36.0)
MCV: 77.3 fL — ABNORMAL LOW (ref 80.0–100.0)
MCV: 77.6 fL — ABNORMAL LOW (ref 80.0–100.0)
Platelets: 208 10*3/uL (ref 150–400)
Platelets: 219 10*3/uL (ref 150–400)
RBC: 3.75 MIL/uL — ABNORMAL LOW (ref 3.87–5.11)
RBC: 3.93 MIL/uL (ref 3.87–5.11)
RDW: 15.2 % (ref 11.5–15.5)
RDW: 15.1 % (ref 11.5–15.5)
WBC: 10.7 10*3/uL — ABNORMAL HIGH (ref 4.0–10.5)
WBC: 14 10*3/uL — ABNORMAL HIGH (ref 4.0–10.5)
nRBC: 0 % (ref 0.0–0.2)
nRBC: 0 % (ref 0.0–0.2)

## 2023-05-19 LAB — GLUCOSE, CAPILLARY
Glucose-Capillary: 74 mg/dL (ref 70–99)
Glucose-Capillary: 78 mg/dL (ref 70–99)
Glucose-Capillary: 80 mg/dL (ref 70–99)
Glucose-Capillary: 82 mg/dL (ref 70–99)
Glucose-Capillary: 94 mg/dL (ref 70–99)

## 2023-05-19 LAB — RPR: RPR Ser Ql: NONREACTIVE

## 2023-05-19 MED ORDER — HYDROCORTISONE 1 % EX CREA
TOPICAL_CREAM | Freq: Three times a day (TID) | CUTANEOUS | Status: DC
  Filled 2023-05-19: qty 28

## 2023-05-19 MED ORDER — FENTANYL-BUPIVACAINE-NACL 0.5-0.125-0.9 MG/250ML-% EP SOLN
12.0000 mL/h | EPIDURAL | Status: DC | PRN
  Administered 2023-05-19 – 2023-05-20 (×2): 12 mL/h via EPIDURAL
  Filled 2023-05-19 (×2): qty 250

## 2023-05-19 MED ORDER — LIDOCAINE HCL (PF) 1 % IJ SOLN
INTRAMUSCULAR | Status: DC | PRN
  Administered 2023-05-19: 10 mL via EPIDURAL

## 2023-05-19 MED ORDER — SERTRALINE HCL 100 MG PO TABS
100.0000 mg | ORAL_TABLET | Freq: Every day | ORAL | Status: DC
Start: 2023-05-19 — End: 2023-05-20
  Filled 2023-05-19 (×2): qty 1

## 2023-05-19 MED ORDER — DIPHENHYDRAMINE HCL 50 MG/ML IJ SOLN: 12.5000 mg | INTRAMUSCULAR | Status: DC | PRN

## 2023-05-19 MED ORDER — BUSPIRONE HCL 5 MG PO TABS
5.0000 mg | ORAL_TABLET | Freq: Three times a day (TID) | ORAL | Status: DC
  Administered 2023-05-20: 5 mg via ORAL
  Filled 2023-05-19 (×6): qty 1

## 2023-05-19 MED ORDER — OXYTOCIN-SODIUM CHLORIDE 30-0.9 UT/500ML-% IV SOLN
1.0000 m[IU]/min | INTRAVENOUS | Status: DC
  Administered 2023-05-19: 2 m[IU]/min via INTRAVENOUS

## 2023-05-19 MED ORDER — EPHEDRINE 5 MG/ML INJ: 10.0000 mg | INTRAVENOUS | Status: DC | PRN

## 2023-05-19 MED ORDER — LACTATED RINGERS IV SOLN
500.0000 mL | Freq: Once | INTRAVENOUS | Status: AC
  Administered 2023-05-19: 500 mL via INTRAVENOUS

## 2023-05-19 MED ORDER — PHENYLEPHRINE 80 MCG/ML (10ML) SYRINGE FOR IV PUSH (FOR BLOOD PRESSURE SUPPORT): 80.0000 ug | PREFILLED_SYRINGE | INTRAVENOUS | Status: DC | PRN

## 2023-05-19 MED ORDER — TERBUTALINE SULFATE 1 MG/ML IJ SOLN: 0.2500 mg | Freq: Once | INTRAMUSCULAR | Status: DC | PRN

## 2023-05-19 NOTE — Progress Notes (Signed)
Patient Vitals for the past 4 hrs:  BP Temp Temp src Pulse Resp  05/19/23 1934 (!) 119/54 98.8 F (37.1 C) Oral 75 18  05/19/23 1901 132/77 -- -- 72 --  05/19/23 1831 134/75 -- -- 71 --  05/19/23 1816 124/69 -- -- 71 --  05/19/23 1811 131/65 -- -- 69 --  05/19/23 1806 119/67 -- -- 72 --  05/19/23 1801 119/67 99 F (37.2 C) Oral 69 18  05/19/23 1755 129/69 -- -- 70 17  05/19/23 1751 123/69 -- -- 78 --  05/19/23 1742 118/77 -- -- 94 (!) 21  05/19/23 1654 134/70 -- -- 63 19   Comfortable w/epidural.  Ctx q 3-5 minutes IUPC placed and shows MVUs ~ 110, pitocin now moved to 61mu/min.  FHR Cat 1. Continue to titrate until labor adequate.

## 2023-05-19 NOTE — Anesthesia Procedure Notes (Signed)
Epidural Patient location during procedure: OB Start time: 05/19/2023 5:34 PM End time: 05/19/2023 5:47 PM  Staffing Anesthesiologist: Trevor Iha, MD Performed: anesthesiologist   Preanesthetic Checklist Completed: patient identified, IV checked, site marked, risks and benefits discussed, surgical consent, monitors and equipment checked, pre-op evaluation and timeout performed  Epidural Patient position: sitting Prep: DuraPrep and site prepped and draped Patient monitoring: continuous pulse ox and blood pressure Approach: midline Location: L3-L4 Injection technique: LOR air  Needle:  Needle type: Tuohy  Needle gauge: 17 G Needle length: 9 cm and 9 Needle insertion depth: 7 cm Catheter type: closed end flexible Catheter size: 19 Gauge Catheter at skin depth: 13 cm Test dose: negative  Assessment Events: blood not aspirated, no cerebrospinal fluid, injection not painful, no injection resistance, no paresthesia and negative IV test  Additional Notes Patient identified. Risks/Benefits/Options discussed with patient including but not limited to bleeding, infection, nerve damage, paralysis, failed block, incomplete pain control, headache, blood pressure changes, nausea, vomiting, reactions to medication both or allergic, itching and postpartum back pain. Confirmed with bedside nurse the patient's most recent platelet count. Confirmed with patient that they are not currently taking any anticoagulation, have any bleeding history or any family history of bleeding disorders. Patient expressed understanding and wished to proceed. All questions were answered. Sterile technique was used throughout the entire procedure. Please see nursing notes for vital signs. Test dose was given through epidural needle and negative prior to continuing to dose epidural or start infusion. Warning signs of high block given to the patient including shortness of breath, tingling/numbness in hands, complete  motor block, or any concerning symptoms with instructions to call for help. Patient was given instructions on fall risk and not to get out of bed. All questions and concerns addressed with instructions to call with any issues.  1 Attempt (S) . Patient tolerated procedure well.

## 2023-05-19 NOTE — Progress Notes (Signed)
Patient ID: Brand Males, female   DOB: April 23, 2000, 22 y.o.   MRN: [id number]  Having some abd tightening, but mostly her back is uncomfortable; heating pad isn't helping; states she doesn't get relief from Flexeril  BPs 132/68, 130/58 FHR 130s, +accels, no decels, Cat 1 Ctx q 2-3 mins Cx closed/thick/vtx -3  IUP@38 .1wk Pre-e w/o SF A1GDM Unfavorable cx  CBGs: 78, 82, 102  -Given an oral dose of cytotec ; she is contracting frequently, but 6 hours has elapsed since her first dose; she feels that they are happening, but isn't breathing with them or showing any signs of distress -Plan to check in 4h either for a repeat dose, or a foley/cytotec combo -Would benefit from Fentanyl prior to exam, esp w foley attempt -Hydrocortisone for itching on abd  Arabella Merles [facility] 05/19/2023 4:31 AM

## 2023-05-19 NOTE — Progress Notes (Signed)
Patient Vitals for the past 4 hrs:  BP Temp Temp src Pulse Resp  05/19/23 2310 117/70 -- -- 97 --  05/19/23 2230 124/70 -- -- 90 16  05/19/23 2200 126/73 -- -- 97 16  05/19/23 2130 (!) 112/58 -- -- 85 16  05/19/23 2100 128/62 -- -- 83 16  05/19/23 2031 (!) 123/51 -- -- 82 16  05/19/23 1934 (!) 119/54 98.8 F (37.1 C) Oral 75 18   Comfortable w/epidural, some rectal pressure w/ctx.  Cx 4.5/90/-1 per RN exam.  FHR 130s, Cat 1.  MVUs ~ 180, pitocin at 16 mu/min.  Continue present mgt.

## 2023-05-19 NOTE — Progress Notes (Signed)
Labor Progress Note Joan Kim is a 23 y.o. G2P0010 at [redacted]w[redacted]d presented for IOL for GDM with LGA and mild PEC  S: feeling painful contractions, has had fentanyl x2  O:  BP 134/63   Pulse 66   Temp 99 F (37.2 C) (Oral)   Resp 19   Ht 5\' 6"  (1.676 m)   Wt 91.8 kg   LMP 08/19/2022   SpO2 100%   BMI 32.67 kg/m  EFM: 145/moderate/+accels, no decels  CVE: Dilation: 1.5 Effacement (%): 70 Station: -3 Presentation: Vertex Exam by:: Dr. Alvester Morin  Unable to placed FB due to effacement and discomfort Offered AROM and reviewed r/b  AROM performed with clear fluid   A&P: 23 y.o. G2P0010 [redacted]w[redacted]d here for IOL for PEC, A1GDM with LGA #Labor: Progressing well. AROM performed. She is s/p cytotec. Discussed possible pitocin if ctx space out. Will not check again fro 4-6 hours unless clinical change now that she is has AROM. #Pain: Fentanyl x2  and asking for third dose. Reviewed realistic expectations and decreasing efficacy of IV meds. Patient desires to avoid epidural. #FWB: Cat 1 #GBS negative  #PEC: mild range BPs #A1GM: BG wnl. 80-102 on POC. LGA infant expected.   Federico Flake, MD 10:50 AM

## 2023-05-19 NOTE — Anesthesia Preprocedure Evaluation (Addendum)
Anesthesia Evaluation  Patient identified by MRN, date of birth, ID band Patient awake    Reviewed: Allergy & Precautions, NPO status , Patient's Chart, lab work & pertinent test results  Airway Mallampati: II  TM Distance: >3 FB Neck ROM: Full    Dental no notable dental hx. (+) Teeth Intact, Dental Advisory Given   Pulmonary    Pulmonary exam normal breath sounds clear to auscultation       Cardiovascular hypertension, Normal cardiovascular exam Rhythm:Regular Rate:Normal     Neuro/Psych   Anxiety        GI/Hepatic Bowel prep,GERD  ,,  Endo/Other  diabetes, Gestational    Renal/GU      Musculoskeletal   Abdominal   Peds  Hematology Lab Results      Component                Value               Date                      WBC                      14.0 (H)            05/19/2023                HGB                      9.7 (L)             05/19/2023                HCT                      30.5 (L)            05/19/2023                MCV                      77.6 (L)            05/19/2023                PLT                      219                 05/19/2023              Anesthesia Other Findings   Reproductive/Obstetrics (+) Pregnancy                             Anesthesia Physical Anesthesia Plan  ASA: 3  Anesthesia Plan: Epidural   Post-op Pain Management:    Induction:   PONV Risk Score and Plan: 2 and Treatment may vary due to age or medical condition, Ondansetron and Scopolamine patch - Pre-op  Airway Management Planned: Natural Airway  Additional Equipment: None  Intra-op Plan:   Post-operative Plan:   Informed Consent: I have reviewed the patients History and Physical, chart, labs and discussed the procedure including the risks, benefits and alternatives for the proposed anesthesia with the patient or authorized representative who has indicated his/her understanding and  acceptance.     Dental advisory given  Plan Discussed with: CRNA and Anesthesiologist  Anesthesia Plan Comments: (38.1 Wk Primagravida w PrE and GDM for LEA  Update: Epidural working well for labor. Plan to use for C-section with GETA as backup plan.)       Anesthesia Quick Evaluation

## 2023-05-20 ENCOUNTER — Other Ambulatory Visit: Payer: Self-pay

## 2023-05-20 ENCOUNTER — Encounter (HOSPITAL_COMMUNITY)
Admission: AD | Disposition: A | Discharge status: Home / Self Care | Payer: Self-pay | Source: Home / Self Care | Attending: Obstetrics and Gynecology

## 2023-05-20 ENCOUNTER — Encounter (HOSPITAL_COMMUNITY): Payer: Self-pay | Admitting: Obstetrics and Gynecology

## 2023-05-20 DIAGNOSIS — O24419 Gestational diabetes mellitus in pregnancy, unspecified control: Secondary | ICD-10-CM

## 2023-05-20 DIAGNOSIS — Z3A38 38 weeks gestation of pregnancy: Secondary | ICD-10-CM | POA: Diagnosis not present

## 2023-05-20 DIAGNOSIS — O24420 Gestational diabetes mellitus in childbirth, diet controlled: Secondary | ICD-10-CM | POA: Diagnosis not present

## 2023-05-20 DIAGNOSIS — O99284 Endocrine, nutritional and metabolic diseases complicating childbirth: Secondary | ICD-10-CM | POA: Diagnosis not present

## 2023-05-20 DIAGNOSIS — Z98891 History of uterine scar from previous surgery: Secondary | ICD-10-CM

## 2023-05-20 DIAGNOSIS — O1404 Mild to moderate pre-eclampsia, complicating childbirth: Secondary | ICD-10-CM | POA: Diagnosis not present

## 2023-05-20 DIAGNOSIS — O3663X0 Maternal care for excessive fetal growth, third trimester, not applicable or unspecified: Secondary | ICD-10-CM | POA: Diagnosis not present

## 2023-05-20 LAB — BPAM FFP
Blood Product Expiration Date: 202502222359
Blood Product Expiration Date: 202502222359
Blood Product Expiration Date: 202502222359
Blood Product Expiration Date: 202502252359
Blood Product Expiration Date: 202502252359
Blood Product Expiration Date: 202502252359
Blood Product Expiration Date: 202502252359
Blood Product Expiration Date: 202502252359
ISSUE DATE / TIME: 202502211429
ISSUE DATE / TIME: 202502211429
ISSUE DATE / TIME: 202502211429
ISSUE DATE / TIME: 202502211429
ISSUE DATE / TIME: 202502211436
ISSUE DATE / TIME: 202502211436
ISSUE DATE / TIME: 202502211436
ISSUE DATE / TIME: 202502211436
Unit Type and Rh: 6200
Unit Type and Rh: 6200
Unit Type and Rh: 6200
Unit Type and Rh: 6200
Unit Type and Rh: 6200
Unit Type and Rh: 6200
Unit Type and Rh: 6200
Unit Type and Rh: 6200

## 2023-05-20 LAB — COMPREHENSIVE METABOLIC PANEL
ALT: 13 U/L (ref 0–44)
AST: 38 U/L (ref 15–41)
Albumin: 2 g/dL — ABNORMAL LOW (ref 3.5–5.0)
Alkaline Phosphatase: 136 U/L — ABNORMAL HIGH (ref 38–126)
Anion gap: 10 (ref 5–15)
BUN: <5 mg/dL — ABNORMAL LOW (ref 6–20)
CO2: 16 mmol/L — ABNORMAL LOW (ref 22–32)
Calcium: 8.6 mg/dL — ABNORMAL LOW (ref 8.9–10.3)
Chloride: 110 mmol/L (ref 98–111)
Creatinine, Ser: 0.62 mg/dL (ref 0.44–1.00)
GFR, Estimated: >60 mL/min (ref >60)
Glucose, Bld: 109 mg/dL — ABNORMAL HIGH (ref 70–99)
Potassium: 3.4 mmol/L — ABNORMAL LOW (ref 3.5–5.1)
Sodium: 136 mmol/L (ref 135–145)
Total Bilirubin: 1.1 mg/dL (ref 0.0–1.2)
Total Protein: 5.1 g/dL — ABNORMAL LOW (ref 6.5–8.1)

## 2023-05-20 LAB — PREPARE FRESH FROZEN PLASMA
Unit division: 0
Unit division: 0
Unit division: 0
Unit division: 0

## 2023-05-20 LAB — GLUCOSE, CAPILLARY
Glucose-Capillary: 71 mg/dL (ref 70–99)
Glucose-Capillary: 79 mg/dL (ref 70–99)
Glucose-Capillary: 83 mg/dL (ref 70–99)
Glucose-Capillary: 85 mg/dL (ref 70–99)
Glucose-Capillary: 89 mg/dL (ref 70–99)

## 2023-05-20 LAB — DIC (DISSEMINATED INTRAVASCULAR COAGULATION)PANEL
D-Dimer, Quant: 4.06 ug{FEU}/mL — ABNORMAL HIGH (ref 0.00–0.50)
D-Dimer, Quant: 9.03 ug{FEU}/mL — ABNORMAL HIGH (ref 0.00–0.50)
Fibrinogen: 480 mg/dL — ABNORMAL HIGH (ref 210–475)
Fibrinogen: 443 mg/dL (ref 210–475)
INR: 1.2 (ref 0.8–1.2)
INR: 1.2 (ref 0.8–1.2)
Platelets: 239 10*3/uL (ref 150–400)
Platelets: 252 10*3/uL (ref 150–400)
Prothrombin Time: 15.8 s — ABNORMAL HIGH (ref 11.4–15.2)
Prothrombin Time: 15.4 s — ABNORMAL HIGH (ref 11.4–15.2)
Smear Review: NONE SEEN
Smear Review: NONE SEEN
aPTT: 29 s (ref 24–36)
aPTT: 33 s (ref 24–36)

## 2023-05-20 LAB — CBC
HCT: 25.6 % — ABNORMAL LOW (ref 36.0–46.0)
HCT: 28.3 % — ABNORMAL LOW (ref 36.0–46.0)
Hemoglobin: 8.3 g/dL — ABNORMAL LOW (ref 12.0–15.0)
Hemoglobin: 9.3 g/dL — ABNORMAL LOW (ref 12.0–15.0)
MCH: 25.5 pg — ABNORMAL LOW (ref 26.0–34.0)
MCH: 26 pg (ref 26.0–34.0)
MCHC: 32.4 g/dL (ref 30.0–36.0)
MCHC: 32.9 g/dL (ref 30.0–36.0)
MCV: 78.8 fL — ABNORMAL LOW (ref 80.0–100.0)
MCV: 79.1 fL — ABNORMAL LOW (ref 80.0–100.0)
Platelets: 240 10*3/uL (ref 150–400)
Platelets: 222 10*3/uL (ref 150–400)
RBC: 3.25 MIL/uL — ABNORMAL LOW (ref 3.87–5.11)
RBC: 3.58 MIL/uL — ABNORMAL LOW (ref 3.87–5.11)
RDW: 15.5 % (ref 11.5–15.5)
RDW: 15.1 % (ref 11.5–15.5)
WBC: 23.1 10*3/uL — ABNORMAL HIGH (ref 4.0–10.5)
WBC: 21.8 10*3/uL — ABNORMAL HIGH (ref 4.0–10.5)
nRBC: 0 % (ref 0.0–0.2)
nRBC: 0 % (ref 0.0–0.2)

## 2023-05-20 LAB — PREPARE PLATELET PHERESIS: Unit division: 0

## 2023-05-20 LAB — BPAM PLATELET PHERESIS
Blood Product Expiration Date: 202502232359
ISSUE DATE / TIME: 202502211428
Unit Type and Rh: 6200

## 2023-05-20 LAB — PREPARE RBC (CROSSMATCH)

## 2023-05-20 LAB — MASSIVE TRANSFUSION PROTOCOL ORDER (BLOOD BANK NOTIFICATION)

## 2023-05-20 SURGERY — Surgical Case
Anesthesia: Epidural

## 2023-05-20 MED ORDER — ZOLPIDEM TARTRATE 5 MG PO TABS: 5.0000 mg | ORAL_TABLET | Freq: Every evening | ORAL | Status: DC | PRN

## 2023-05-20 MED ORDER — FENTANYL CITRATE (PF) 100 MCG/2ML IJ SOLN
INTRAMUSCULAR | Status: AC
  Filled 2023-05-20: qty 2

## 2023-05-20 MED ORDER — OXYCODONE HCL 5 MG PO TABS
5.0000 mg | ORAL_TABLET | ORAL | Status: DC | PRN
  Administered 2023-05-21 – 2023-05-22 (×4): 5 mg via ORAL
  Filled 2023-05-20 (×5): qty 1

## 2023-05-20 MED ORDER — TRANEXAMIC ACID-NACL 1000-0.7 MG/100ML-% IV SOLN
INTRAVENOUS | Status: AC
  Filled 2023-05-20: qty 100

## 2023-05-20 MED ORDER — SODIUM CHLORIDE 0.9 % IV SOLN: 12.5000 mg | INTRAVENOUS | Status: DC | PRN

## 2023-05-20 MED ORDER — STERILE WATER FOR IRRIGATION IR SOLN
Status: DC | PRN
  Administered 2023-05-20: 1

## 2023-05-20 MED ORDER — NALOXONE HCL 4 MG/10ML IJ SOLN: 1.0000 ug/kg/h | INTRAVENOUS | Status: DC | PRN

## 2023-05-20 MED ORDER — OXYCODONE HCL 5 MG/5ML PO SOLN: 5.0000 mg | Freq: Once | ORAL | Status: DC | PRN

## 2023-05-20 MED ORDER — KETOROLAC TROMETHAMINE 30 MG/ML IJ SOLN: 30.0000 mg | Freq: Four times a day (QID) | INTRAMUSCULAR | Status: AC | PRN

## 2023-05-20 MED ORDER — DEXAMETHASONE SODIUM PHOSPHATE 10 MG/ML IJ SOLN
INTRAMUSCULAR | Status: DC | PRN
  Administered 2023-05-20 (×2): 5 mg via INTRAVENOUS

## 2023-05-20 MED ORDER — FENTANYL CITRATE (PF) 100 MCG/2ML IJ SOLN
100.0000 ug | Freq: Once | INTRAMUSCULAR | Status: AC
  Administered 2023-05-20: 100 ug via INTRAVENOUS

## 2023-05-20 MED ORDER — CEFAZOLIN SODIUM-DEXTROSE 2-3 GM-%(50ML) IV SOLR
INTRAVENOUS | Status: DC | PRN
  Administered 2023-05-20: 2 g via INTRAVENOUS

## 2023-05-20 MED ORDER — MORPHINE SULFATE (PF) 0.5 MG/ML IJ SOLN
INTRAMUSCULAR | Status: DC | PRN
Start: 2023-05-20 — End: 2023-05-20
  Administered 2023-05-20: 3 mg via EPIDURAL

## 2023-05-20 MED ORDER — POTASSIUM CHLORIDE CRYS ER 20 MEQ PO TBCR
20.0000 meq | EXTENDED_RELEASE_TABLET | Freq: Two times a day (BID) | ORAL | Status: AC
  Administered 2023-05-21 – 2023-05-22 (×4): 20 meq via ORAL
  Filled 2023-05-20 (×4): qty 1

## 2023-05-20 MED ORDER — DIPHENHYDRAMINE HCL 25 MG PO CAPS: 25.0000 mg | ORAL_CAPSULE | ORAL | Status: DC | PRN

## 2023-05-20 MED ORDER — MISOPROSTOL 200 MCG PO TABS
800.0000 ug | ORAL_TABLET | Freq: Once | ORAL | Status: AC
  Administered 2023-05-20: 800 ug via BUCCAL

## 2023-05-20 MED ORDER — DIPHENHYDRAMINE HCL 25 MG PO CAPS: 25.0000 mg | ORAL_CAPSULE | Freq: Four times a day (QID) | ORAL | Status: DC | PRN

## 2023-05-20 MED ORDER — SIMETHICONE 80 MG PO CHEW
80.0000 mg | CHEWABLE_TABLET | ORAL | Status: DC | PRN
  Administered 2023-05-21: 80 mg via ORAL

## 2023-05-20 MED ORDER — SODIUM CHLORIDE 0.9 % IV SOLN
INTRAVENOUS | Status: DC | PRN
  Administered 2023-05-20: 500 mg via INTRAVENOUS

## 2023-05-20 MED ORDER — IBUPROFEN 600 MG PO TABS
600.0000 mg | ORAL_TABLET | Freq: Four times a day (QID) | ORAL | Status: DC
  Administered 2023-05-21 – 2023-05-23 (×7): 600 mg via ORAL
  Filled 2023-05-20 (×9): qty 1

## 2023-05-20 MED ORDER — DIPHENHYDRAMINE HCL 50 MG/ML IJ SOLN: 12.5000 mg | INTRAMUSCULAR | Status: DC | PRN

## 2023-05-20 MED ORDER — OXYTOCIN-SODIUM CHLORIDE 30-0.9 UT/500ML-% IV SOLN
INTRAVENOUS | Status: AC
  Filled 2023-05-20: qty 500

## 2023-05-20 MED ORDER — PRENATAL MULTIVITAMIN CH
1.0000 | ORAL_TABLET | Freq: Every day | ORAL | Status: DC
  Administered 2023-05-21 – 2023-05-22 (×2): 1 via ORAL
  Filled 2023-05-20 (×2): qty 1

## 2023-05-20 MED ORDER — MEPERIDINE HCL 25 MG/ML IJ SOLN: 6.2500 mg | INTRAMUSCULAR | Status: DC | PRN

## 2023-05-20 MED ORDER — DEXAMETHASONE SODIUM PHOSPHATE 10 MG/ML IJ SOLN
INTRAMUSCULAR | Status: AC
  Filled 2023-05-20: qty 1

## 2023-05-20 MED ORDER — SODIUM CHLORIDE 0.9% IV SOLUTION: Freq: Once | INTRAVENOUS | Status: AC

## 2023-05-20 MED ORDER — TRANEXAMIC ACID-NACL 1000-0.7 MG/100ML-% IV SOLN
1000.0000 mg | Freq: Once | INTRAVENOUS | Status: AC
  Administered 2023-05-20: 1000 mg via INTRAVENOUS

## 2023-05-20 MED ORDER — OXYTOCIN-SODIUM CHLORIDE 30-0.9 UT/500ML-% IV SOLN
2.5000 [IU]/h | INTRAVENOUS | Status: AC
  Administered 2023-05-21: 2.5 [IU]/h via INTRAVENOUS
  Filled 2023-05-20: qty 500

## 2023-05-20 MED ORDER — AZITHROMYCIN 500 MG IV SOLR
INTRAVENOUS | Status: AC
  Filled 2023-05-20: qty 5

## 2023-05-20 MED ORDER — SCOPOLAMINE 1 MG/3DAYS TD PT72: 1.0000 | MEDICATED_PATCH | Freq: Once | TRANSDERMAL | Status: DC

## 2023-05-20 MED ORDER — CEFAZOLIN SODIUM-DEXTROSE 2-4 GM/100ML-% IV SOLN
INTRAVENOUS | Status: AC
  Filled 2023-05-20: qty 100

## 2023-05-20 MED ORDER — SENNOSIDES-DOCUSATE SODIUM 8.6-50 MG PO TABS
2.0000 | ORAL_TABLET | ORAL | Status: DC
  Administered 2023-05-21 – 2023-05-23 (×3): 2 via ORAL
  Filled 2023-05-20 (×3): qty 2

## 2023-05-20 MED ORDER — MORPHINE SULFATE (PF) 0.5 MG/ML IJ SOLN
INTRAMUSCULAR | Status: AC
  Filled 2023-05-20: qty 10

## 2023-05-20 MED ORDER — METHYLERGONOVINE MALEATE 0.2 MG/ML IJ SOLN
INTRAMUSCULAR | Status: AC
  Filled 2023-05-20: qty 1

## 2023-05-20 MED ORDER — COCONUT OIL OIL: 1.0000 | TOPICAL_OIL | Status: DC | PRN

## 2023-05-20 MED ORDER — FENTANYL CITRATE (PF) 100 MCG/2ML IJ SOLN
INTRAMUSCULAR | Status: DC | PRN
  Administered 2023-05-20: 100 ug via EPIDURAL

## 2023-05-20 MED ORDER — ONDANSETRON HCL 4 MG/2ML IJ SOLN: 4.0000 mg | Freq: Three times a day (TID) | INTRAMUSCULAR | Status: DC | PRN

## 2023-05-20 MED ORDER — NALOXONE HCL 0.4 MG/ML IJ SOLN: 0.4000 mg | INTRAMUSCULAR | Status: DC | PRN

## 2023-05-20 MED ORDER — ONDANSETRON HCL 4 MG/2ML IJ SOLN
INTRAMUSCULAR | Status: AC
  Filled 2023-05-20: qty 2

## 2023-05-20 MED ORDER — MENTHOL 3 MG MT LOZG: 1.0000 | LOZENGE | OROMUCOSAL | Status: DC | PRN

## 2023-05-20 MED ORDER — TRANEXAMIC ACID-NACL 1000-0.7 MG/100ML-% IV SOLN
INTRAVENOUS | Status: DC | PRN
Start: 2023-05-20 — End: 2023-05-20
  Administered 2023-05-20: 1000 mg via INTRAVENOUS

## 2023-05-20 MED ORDER — DIBUCAINE (PERIANAL) 1 % EX OINT: 1.0000 | TOPICAL_OINTMENT | CUTANEOUS | Status: DC | PRN

## 2023-05-20 MED ORDER — METHYLERGONOVINE MALEATE 0.2 MG/ML IJ SOLN
0.2000 mg | Freq: Once | INTRAMUSCULAR | Status: AC
  Administered 2023-05-20: 0.2 mg via INTRAMUSCULAR

## 2023-05-20 MED ORDER — LACTATED RINGERS IV SOLN: INTRAVENOUS | Status: DC

## 2023-05-20 MED ORDER — MEASLES, MUMPS & RUBELLA VAC IJ SOLR: 0.5000 mL | Freq: Once | INTRAMUSCULAR | Status: DC

## 2023-05-20 MED ORDER — SODIUM CHLORIDE 0.9% FLUSH: 3.0000 mL | INTRAVENOUS | Status: DC | PRN

## 2023-05-20 MED ORDER — AMISULPRIDE (ANTIEMETIC) 5 MG/2ML IV SOLN: 10.0000 mg | Freq: Once | INTRAVENOUS | Status: DC | PRN

## 2023-05-20 MED ORDER — FENTANYL CITRATE (PF) 100 MCG/2ML IJ SOLN: 25.0000 ug | INTRAMUSCULAR | Status: DC | PRN

## 2023-05-20 MED ORDER — OXYTOCIN-SODIUM CHLORIDE 30-0.9 UT/500ML-% IV SOLN
INTRAVENOUS | Status: DC | PRN
  Administered 2023-05-20: 300 mL via INTRAVENOUS

## 2023-05-20 MED ORDER — SIMETHICONE 80 MG PO CHEW
80.0000 mg | CHEWABLE_TABLET | Freq: Three times a day (TID) | ORAL | Status: DC
  Administered 2023-05-21 – 2023-05-23 (×7): 80 mg via ORAL
  Filled 2023-05-20 (×6): qty 1

## 2023-05-20 MED ORDER — LIDOCAINE-EPINEPHRINE (PF) 2 %-1:200000 IJ SOLN
INTRAMUSCULAR | Status: DC | PRN
  Administered 2023-05-20: 3 mL via EPIDURAL
  Administered 2023-05-20: 10 mL via EPIDURAL
  Administered 2023-05-20: 2 mL via EPIDURAL
  Administered 2023-05-20: 5 mL via EPIDURAL

## 2023-05-20 MED ORDER — FUROSEMIDE 20 MG PO TABS
20.0000 mg | ORAL_TABLET | Freq: Every day | ORAL | Status: DC
Start: 2023-05-21 — End: 2023-05-23
  Administered 2023-05-21 – 2023-05-23 (×3): 20 mg via ORAL
  Filled 2023-05-20 (×3): qty 1

## 2023-05-20 MED ORDER — ACETAMINOPHEN 500 MG PO TABS
1000.0000 mg | ORAL_TABLET | Freq: Four times a day (QID) | ORAL | Status: DC
  Administered 2023-05-20 – 2023-05-23 (×10): 1000 mg via ORAL
  Filled 2023-05-20 (×10): qty 2

## 2023-05-20 MED ORDER — LACTATED RINGERS IV SOLN: INTRAVENOUS | Status: DC | PRN

## 2023-05-20 MED ORDER — MISOPROSTOL 200 MCG PO TABS
ORAL_TABLET | ORAL | Status: AC
  Filled 2023-05-20: qty 4

## 2023-05-20 MED ORDER — SODIUM CHLORIDE 0.9 % IV SOLN
3.0000 g | Freq: Four times a day (QID) | INTRAVENOUS | Status: AC
  Administered 2023-05-20 – 2023-05-21 (×4): 3 g via INTRAVENOUS
  Filled 2023-05-20 (×4): qty 8

## 2023-05-20 MED ORDER — ENOXAPARIN SODIUM 40 MG/0.4ML IJ SOSY
40.0000 mg | PREFILLED_SYRINGE | INTRAMUSCULAR | Status: DC
  Administered 2023-05-21 – 2023-05-22 (×2): 40 mg via SUBCUTANEOUS
  Filled 2023-05-20 (×2): qty 0.4

## 2023-05-20 MED ORDER — OXYCODONE HCL 5 MG PO TABS: 5.0000 mg | ORAL_TABLET | Freq: Once | ORAL | Status: DC | PRN

## 2023-05-20 MED ORDER — ACETAMINOPHEN 10 MG/ML IV SOLN
INTRAVENOUS | Status: DC | PRN
Start: 2023-05-20 — End: 2023-05-20
  Administered 2023-05-20: 1000 mg via INTRAVENOUS

## 2023-05-20 MED ORDER — ENOXAPARIN SODIUM 40 MG/0.4ML IJ SOSY: 40.0000 mg | PREFILLED_SYRINGE | INTRAMUSCULAR | Status: DC

## 2023-05-20 MED ORDER — LIDOCAINE-EPINEPHRINE (PF) 2 %-1:200000 IJ SOLN
INTRAMUSCULAR | Status: AC
  Filled 2023-05-20: qty 20

## 2023-05-20 MED ORDER — WITCH HAZEL-GLYCERIN EX PADS: 1.0000 | MEDICATED_PAD | CUTANEOUS | Status: DC | PRN

## 2023-05-20 MED ORDER — ACETAMINOPHEN 10 MG/ML IV SOLN
INTRAVENOUS | Status: AC
  Filled 2023-05-20: qty 100

## 2023-05-20 SURGICAL SUPPLY — 29 items
BENZOIN TINCTURE PRP APPL 2/3 (GAUZE/BANDAGES/DRESSINGS) ×1 IMPLANT
CHLORAPREP W/TINT 26 (MISCELLANEOUS) ×2 IMPLANT
CLAMP UMBILICAL CORD (MISCELLANEOUS) ×1 IMPLANT
CLOTH BEACON ORANGE TIMEOUT ST (SAFETY) ×1 IMPLANT
DRSG OPSITE POSTOP 4X10 (GAUZE/BANDAGES/DRESSINGS) ×1 IMPLANT
ELECT REM PT RETURN 9FT ADLT (ELECTROSURGICAL) ×1 IMPLANT
ELECTRODE REM PT RTRN 9FT ADLT (ELECTROSURGICAL) ×1 IMPLANT
EXTRACTOR VACUUM M CUP 4 TUBE (SUCTIONS) IMPLANT
GAUZE SPONGE 4X4 12PLY STRL LF (GAUZE/BANDAGES/DRESSINGS) IMPLANT
GLOVE BIOGEL PI IND STRL 7.0 (GLOVE) ×2 IMPLANT
GLOVE BIOGEL PI IND STRL 7.5 (GLOVE) ×2 IMPLANT
GLOVE ECLIPSE 7.5 STRL STRAW (GLOVE) ×1 IMPLANT
GOWN STRL REUS W/TWL LRG LVL3 (GOWN DISPOSABLE) ×3 IMPLANT
KIT ABG SYR 3ML LUER SLIP (SYRINGE) IMPLANT
MAT PREVALON FULL STRYKER (MISCELLANEOUS) IMPLANT
NDL HYPO 25X5/8 SAFETYGLIDE (NEEDLE) IMPLANT
NEEDLE HYPO 25X5/8 SAFETYGLIDE (NEEDLE) IMPLANT
NS IRRIG 1000ML POUR BTL (IV SOLUTION) ×1 IMPLANT
PACK C SECTION WH (CUSTOM PROCEDURE TRAY) ×1 IMPLANT
PAD OB MATERNITY 4.3X12.25 (PERSONAL CARE ITEMS) ×1 IMPLANT
RTRCTR C-SECT PINK 25CM LRG (MISCELLANEOUS) ×1 IMPLANT
STRIP CLOSURE SKIN 1/2X4 (GAUZE/BANDAGES/DRESSINGS) ×1 IMPLANT
SUT PLAIN ABS 2-0 CT1 27XMFL (SUTURE) IMPLANT
SUT VIC AB 0 CT1 36 (SUTURE) ×3 IMPLANT
SUT VIC AB 2-0 CT1 TAPERPNT 27 (SUTURE) ×1 IMPLANT
SUT VIC AB 4-0 KS 27 (SUTURE) ×1 IMPLANT
TOWEL OR 17X24 6PK STRL BLUE (TOWEL DISPOSABLE) ×1 IMPLANT
TRAY FOLEY W/BAG SLVR 14FR LF (SET/KITS/TRAYS/PACK) ×1 IMPLANT
WATER STERILE IRR 1000ML POUR (IV SOLUTION) ×1 IMPLANT

## 2023-05-20 NOTE — Progress Notes (Signed)
Patient ID: Joan Kim, female   DOB: [birth year], 23 y.o.   MRN: [id number]  BP (!) 144/65   Pulse (!) 106   Temp 99.1 F (37.3 C) (Axillary)   Resp 19   Ht 5\' 6"  (1.676 m)   Wt 202 lb 6.4 oz (91.8 kg)   LMP 08/19/2022   SpO2 98%   BMI 32.67 kg/m .  Dilation: 10 Dilation Complete Date: 05/20/23 Dilation Complete Time: 0501 Effacement (%): 100 Station: Plus 2 Presentation: Vertex Exam by:: Troy Sine RN   Called to assess pushing efforts by RN. Pt continues to bear down with every contraction, moves baby well but remains at about a +1 station (caput is +2). Can see baby's head rocking under the pubic bone, but will not/has not extended further. Labia becoming very swollen. Baby tolerating labor/pushing efforts well but pt getting tired and epidural is no longer providing any relief. Discussed options for assisted delivery including vacuum assist and surgical delivery, pt amenable to either. Called Dr. Ashok Pall to bedside and he assumed care of the patient.  Edd Arbour, CNM, MSN, IBCLC Certified Nurse Midwife, [facility]

## 2023-05-20 NOTE — Discharge Summary (Signed)
 Postpartum Discharge Summary      Patient Name: Joan Kim DOB: February 20, 2001 MRN: [id number]  Date of admission: 05/18/2023 Delivery date:05/20/2023 Delivering provider: Shonna Chock [place] Date of discharge: 05/23/2023  Admitting diagnosis: Mild preeclampsia [O14.00] Intrauterine pregnancy: [redacted]w[redacted]d     Secondary diagnosis:  Principal Problem:   Mild preeclampsia Active Problems:   Anxiety and depression   Thyroid dysfunction in pregnancy, antepartum   Gestational diabetes mellitus (GDM) affecting pregnancy, antepartum   LGA (large for gestational age) fetus affecting management of mother, third trimester   History of low transverse cesarean section  Additional problems: n/a    Discharge diagnosis: Term Pregnancy Delivered, Preeclampsia (mild), GDM A1, and PPH                                              Post partum procedures:blood transfusion Augmentation: AROM, Pitocin, and Cytotec Complications: Hemorrhage>101mL  Hospital course: Induction of Labor With Cesarean Section   23 y.o. yo G2P1011 at [redacted]w[redacted]d was admitted to the hospital 05/18/2023 for induction of labor. Patient had a labor course significant for arrest of descent with 3hr pushing phase. The patient went for cesarean section due to [facility] and Arrest of Descent. Delivery details are as follows: Membrane Rupture Time/Date: 9:34 AM,05/19/2023  Delivery Method:C-Section, Low Transverse Details of operation can be found in separate operative Note.  Patient had a postpartum course complicated by PPH of acute blood loss anemia that was clinically significant and required Jada and 2U pRBC transfusion after 1300cc QBL.  She also was sent out on Lasix, K supplementation given mild preeclampsia. She is ambulating, tolerating a regular diet, passing flatus, and urinating well.  Patient is discharged home in stable condition on 05/23/23.      Newborn Data: Birth date:05/20/2023 Birth time:12:41 PM Gender:Female Living  status:Living Apgars:9 ,9  Weight:3780 g                               Magnesium Sulfate received: No BMZ received: No Rhophylac:N/A MMR:N/A T-DaP:Given prenatally Flu: No RSV Vaccine received: No Transfusion:Yes  Immunizations received: Immunization History  Administered Date(s) Administered   HPV 9-valent 11/12/2011, 01/24/2012, 05/12/2013   IPV 06/15/2000, 08/11/2000, 10/13/2000, 11/11/2004   Influenza,inj,Quad PF,6+ Mos 01/24/2012, 02/07/2018   PFIZER(Purple Top)SARS-COV-2 Vaccination 08/13/2019, 09/03/2019   Td 11/12/2011   Tdap 11/12/2011, 03/10/2023   Varicella 04/12/2001, 11/24/2005   Physical exam  Vitals:   05/22/23 0516 05/22/23 1401 05/22/23 2019 05/23/23 0506  BP: 126/89 133/84 127/84 131/79  Pulse: 66 63 75 60  Resp: 18 18 17 17   Temp: 98 F (36.7 C) 98.5 F (36.9 C)    TempSrc:  Oral    SpO2: 99% 98% 100% 100%  Weight:      Height:       General: alert, cooperative, and no distress Lochia: appropriate Uterine Fundus: firm Incision: Healing well with no significant drainage, No significant erythema, Dressing is clean, dry, and intact DVT Evaluation: No evidence of DVT seen on physical exam.  Labs: Lab Results  Component Value Date   WBC 16.6 (H) 05/21/2023   HGB 8.0 (L) 05/21/2023   HCT 24.4 (L) 05/21/2023   MCV 80.0 05/21/2023   PLT 192 05/21/2023      Latest Ref Rng & Units 05/21/2023    4:44 AM  CMP  Glucose 70 - 99 mg/dL 81   BUN 6 - 20 mg/dL <5   Creatinine 1.09 - 1.00 mg/dL 6.04   Sodium 540 - 981 mmol/L 137   Potassium 3.5 - 5.1 mmol/L 3.5   Chloride 98 - 111 mmol/L 108   CO2 22 - 32 mmol/L 21   Calcium 8.9 - 10.3 mg/dL 7.8   Total Protein 6.5 - 8.1 g/dL 3.8   Total Bilirubin 0.0 - 1.2 mg/dL 0.4   Alkaline Phos 38 - 126 U/L 106   AST 15 - 41 U/L 35   ALT 0 - 44 U/L 12    Edinburgh Score:    05/21/2023    7:30 AM  Edinburgh Postnatal Depression Scale Screening Tool  I have been able to laugh and see the funny side of  things. 0  I have looked forward with enjoyment to things. 1  I have blamed myself unnecessarily when things went wrong. 2  I have been anxious or worried for no good reason. 1  I have felt scared or panicky for no good reason. 0  Things have been getting on top of me. 1  I have been so unhappy that I have had difficulty sleeping. 0  I have felt sad or miserable. 0  I have been so unhappy that I have been crying. 0  The thought of harming myself has occurred to me. 0  Edinburgh Postnatal Depression Scale Total 5   No data recorded  After visit meds:  Allergies as of 05/23/2023       Reactions   Latex Rash        Medication List     TAKE these medications    Accu-Chek Guide w/Device Kit 1 Device by Does not apply route 4 (four) times daily.   Accu-Chek Softclix Lancets lancets 1 each by Other route 4 (four) times daily.   acetaminophen 325 MG tablet Commonly known as: TYLENOL Take 650 mg by mouth every 6 (six) hours as needed.   busPIRone 5 MG tablet Commonly known as: BUSPAR Take 5 mg by mouth 3 (three) times daily.   cyclobenzaprine 5 MG tablet Commonly known as: FLEXERIL Take 1 tablet (5 mg total) by mouth every 8 (eight) hours as needed.   famotidine 20 MG tablet Commonly known as: PEPCID Take 1 tablet (20 mg total) by mouth 2 (two) times daily.   Ferric Maltol 30 MG Caps Take 1 capsule (30 mg total) by mouth 2 (two) times daily. Please take one hour before breakfast and dinner   ferrous gluconate 324 MG tablet Commonly known as: FERGON Take 1 tablet (324 mg total) by mouth every other day.   furosemide 20 MG tablet Commonly known as: LASIX Take 1 tablet (20 mg total) by mouth daily.   glucose blood test strip Use as instructed   ibuprofen 600 MG tablet Commonly known as: ADVIL Take 1 tablet (600 mg total) by mouth every 6 (six) hours.   oxyCODONE 5 MG immediate release tablet Commonly known as: Oxy IR/ROXICODONE Take 1 tablet (5 mg total) by  mouth every 4 (four) hours as needed for moderate pain (pain score 4-6).   potassium chloride SA 20 MEQ tablet Commonly known as: KLOR-CON M Take 1 tablet (20 mEq total) by mouth daily.   Prenatal/Iron Tabs Take 1 tablet by mouth daily.   promethazine 25 MG tablet Commonly known as: PHENERGAN Take 25 mg by mouth every 6 (six) hours as needed for nausea or vomiting.  senna-docusate 8.6-50 MG tablet Commonly known as: Senokot-S Take 2 tablets by mouth 2 (two) times daily as needed for mild constipation.   sertraline 100 MG tablet Commonly known as: ZOLOFT Take 1 tablet (100 mg total) by mouth daily.   Ventolin HFA 108 (90 Base) MCG/ACT inhaler Generic drug: albuterol INHALE 2 PUFFS INTO THE LUNGS EVERY 4 HOURS AS NEEDED FOR WHEEZING OR SHORTNESS OF BREATH.      Discharge home in stable condition Infant Feeding: Bottle and Breast Infant Disposition:home with mother Discharge instruction: per After Visit Summary and Postpartum booklet. Activity: Advance as tolerated. Pelvic rest for 6 weeks.  Diet: routine diet  Future Appointments: Future Appointments  Date Time Provider Department Center  07/01/2023 10:20 AM Tobb, Lavona [place], DO [facility] None   Follow up Visit: Message sent to [facility] on 05/23/23 by Edd Arbour, CNM Please schedule this patient for a In person postpartum visit in 6 weeks with the following provider: Any provider. Additional Postpartum F/U:BP & Incision check 1 week  High risk pregnancy complicated by: GDM and HTN Delivery mode:  C-Section, Low Transverse Anticipated Birth Control:   declined  05/23/2023 Hessie Dibble, MD

## 2023-05-20 NOTE — Anesthesia Postprocedure Evaluation (Signed)
Anesthesia Post Note  Patient: Joan Kim  Procedure(s) Performed: CESAREAN SECTION     Patient location during evaluation: PACU Anesthesia Type: Epidural Level of consciousness: awake and alert Pain management: pain level controlled Vital Signs Assessment: post-procedure vital signs reviewed and stable Respiratory status: spontaneous breathing, respiratory function stable and nonlabored ventilation Cardiovascular status: blood pressure returned to baseline Postop Assessment: epidural receding and no apparent nausea or vomiting Anesthetic complications: no   No notable events documented.  Last Vitals:  Vitals:   05/20/23 1610 05/20/23 1715  BP: 128/73 132/70  Pulse: 94 91  Resp: 20   Temp:  37.5 C  SpO2: 97% 98%    Last Pain:  Vitals:   05/20/23 1715  TempSrc: Axillary  PainSc: 0-No pain   Pain Goal:                   Beryle Lathe

## 2023-05-20 NOTE — Research (Signed)
Chart was open for review due to be assigned to this patient by charge nurse K. Isley  to the [facility] on 05/20/2023 for room 402. Marland Kitchen

## 2023-05-20 NOTE — OR Nursing (Signed)
Dr. Ashok Pall asked for the RN to milk the bladder in the OR.  The bladder was irrigated with fluid and was returned in the foley bag once the procedure was done.

## 2023-05-20 NOTE — Progress Notes (Addendum)
Patient is a 23 yo g2p0 at 38+2 pregnancy complicated by a1gdm, lga fetus with an extrapolated efw of about 4100 grams, also mild preE, here for iol for the above. Complete now since 5 am and pushing for 3 hours, has made it to plus 2 station but no progress since. She is exhausted and doesn't think she can push more so has ruled out continued pushing. We reviewed the risks of operative (vacuum) delivery including risks to fetus (laceration, intracranial bleeding) and risks of shoulder dystocia. We also reviewed the risks of cesarean, and the patient is aware that a cesarean at this station entails greater risks than average.The patient is exhausted declines option of additional pushing, she also declines operative delivery. She firmly opts to proceed with cesarean delivery.  The risks of cesarean section were discussed with the patient including but were not limited to: bleeding which may require transfusion or reoperation; infection which may require antibiotics; injury to bowel, bladder, ureters or other surrounding organs; injury to the fetus; need for additional procedures including hysterectomy in the event of a life-threatening hemorrhage; placental abnormalities wth subsequent pregnancies, incisional problems, thromboembolic phenomenon and other postoperative/anesthesia complications.  Anesthesia and OR aware.  Preoperative prophylactic antibiotics and SCDs ordered on call to the OR.  To OR when ready. Prophylactic txa ordered.

## 2023-05-20 NOTE — Op Note (Signed)
 Cesarean Section Operative Report  Ader HALSTON [place]  05/20/2023  Indications: second stage arrest, maternal request  Pre-operative Diagnosis: primary low transverse cesarean section  Post-operative Diagnosis: Same   Surgeon: Surgeons and Role:    * Aliciana Ricciardi, Wilfred Curtis, MD - Primary    * Adam Phenix, MD - Assisting    * Wyn Forster, MD - Fellow   Attending Attestation: I was present and scrubbed for the entire procedure.   An experienced assistant was required given the standard of surgical care given the complexity of the case.  This assistant was needed for exposure, dissection, suctioning, retraction, instrument exchange, assisting with delivery with administration of fundal pressure, and for overall help during the procedure.  Anesthesia: epidural    Estimated Blood Loss: 550 ml  Total IV Fluids: 2000 ml LR  Urine Output:: 250 ml bloody urine  Specimens: none  Findings: Viable female infant in cephalic presentation; Apgars pending; weight pending; arterial cord pH not obtained;  clear amniotic fluid; intact placenta with three vessel cord; normal uterus, fallopian tubes and ovaries bilaterally.  Baby condition / location:  Couplet care / Skin to Skin   Complications: Hematuria (see below)  Indications: Joan Kim is a 23 y.o. G2P1011 with an IUP [redacted]w[redacted]d presenting for IOL for mild preE in the setting of a1gdm with an LGA fetus. Progressed to complete and pushed for 3 hours to exhaustion, declined attempt at operative delivery.  The risks, benefits, complications, treatment options, and exected outcomes were discussed with the patient . The patient dwith the proposed plan, giving informed consent. identified as Joan Kim and the procedure verified as C-Section Delivery.  Procedure Details:  The patient was taken back to the operative suite where epidural anesthesia was dosed and found to be adequate. Fetal heart tones normal. Fetal head was elevated  manually via the vagina to assist with abdominal delivery.   A time out was held and the above information confirmed.   Tranexamic acid given prophylactically.  After induction of anesthesia, the patient was draped and prepped in the usual sterile manner and placed in a dorsal supine position with a leftward tilt. A Pfannenstiel incision was made and carried down through the subcutaneous tissue to the fascia. Fascial incision was made and bluntly extended transversely. The fascia was separated from the underlying rectus tissue superiorly and inferiorly. The peritoneum was identified and bluntly entered and extended longitudinally. Alexis retractor was placed. A bladder flap was not created. A low transverse uterine incision was made well cephalad and extended bluntly. Delivered from cephalic presentation with some difficulty (Dr.  Leanora Cover attempted and then Dr. Ashok Pall succeeded) was a viable infant with Apgars and weight as above. OP presentation.  After waiting 60 seconds for delayed cord cutting, the umbilical cord was clamped and cut cord blood was obtained for evaluation. Cord ph was not sent. The placenta was removed Intact and appeared normal. The uterine outline, tubes and ovaries appeared normal. The uterine incision was closed with running unlocked sutures 0-Vicryl in one layer.   Hemostasis was observed after placement of one 0 vicryl figure of 8 suture. The peritoneum was closed with 2-0 vicryl. The rectus muscles were examined and hemostasis observed. The fascia was then reapproximated with running sutures of 0-Vicryl. The subcuticular closure was performed with 2-0 plain gut. The skin was closed with 4-0 Vicryl.  Instrument, sponge, and needle counts were correct prior the abdominal closure and were correct at the conclusion of the case.  Disposition: PACU - hemodynamically stable.   Maternal Condition: stable       Signed: Silvano Bilis, MD 05/20/2023 1:33 PM

## 2023-05-20 NOTE — Progress Notes (Signed)
OB Note Feeling well. Updated VS normal and stable (barely mild range BP) NAD Jada removed and some old clots came out with it. No more VB. New pad placed Ext: 3+ b/l LE edema  A/P: pt doing well Start lasix qday tomorrow. Add BP med PRN. Continue foley o/n  Cornelia Copa MD Attending Center for [employer] (Faculty Practice) 05/20/2023 Time: [phone]

## 2023-05-20 NOTE — Progress Notes (Signed)
OB Note  Patient feeling well and is taking some light PO w/o issue Patient Vitals for the past 6 hrs:  BP Temp Temp src Pulse Resp SpO2  05/20/23 1715 132/70 99.5 F (37.5 C) Axillary 91 -- 98 %  05/20/23 1610 128/73 -- -- 94 20 97 %  05/20/23 1545 (!) 136/94 -- -- 81 20 --  05/20/23 1535 -- (!) 102.2 F (39 C) Axillary 85 (!) 28 97 %  05/20/23 1530 137/88 -- -- 81 (!) 25 97 %  05/20/23 1519 -- (!) 102.7 F (39.3 C) Axillary 91 (!) 21 98 %  05/20/23 1518 -- -- -- 95 (!) 24 --  05/20/23 1517 -- -- -- 94 (!) 27 --  05/20/23 1516 -- -- -- 83 (!) 28 --  05/20/23 1515 135/78 -- -- 82 (!) 27 97 %  05/20/23 1514 -- -- -- 89 (!) 21 --  05/20/23 1513 -- -- -- 85 (!) 28 --  05/20/23 1512 -- -- -- 86 (!) 24 --  05/20/23 1511 -- -- -- 85 (!) 25 --  05/20/23 1510 -- -- -- 85 (!) 21 --  05/20/23 1509 -- -- -- 87 (!) 26 --  05/20/23 1508 -- -- -- 92 (!) 24 --  05/20/23 1507 -- -- -- 99 (!) 24 --  05/20/23 1506 -- -- -- 90 (!) 27 --  05/20/23 1505 -- -- -- 92 19 --  05/20/23 1504 -- -- -- 87 (!) 28 --  05/20/23 1503 -- -- -- 92 (!) 23 --  05/20/23 1502 -- -- -- 89 (!) 27 --  05/20/23 1501 -- -- -- 96 (!) 26 --  05/20/23 1500 135/68 -- -- 98 (!) 27 98 %  05/20/23 1459 136/78 -- -- 99 (!) 26 --  05/20/23 1458 -- -- -- (!) 104 (!) 23 --  05/20/23 1457 -- (!) 101.1 F (38.4 C) Axillary (!) 103 (!) 29 100 %  05/20/23 1456 -- -- -- (!) 107 (!) 26 --  05/20/23 1455 -- (!) 101 F (38.3 C) Axillary (!) 102 (!) 24 98 %  05/20/23 1454 -- -- -- (!) 102 (!) 28 --  05/20/23 1453 -- -- -- 99 (!) 29 --  05/20/23 1452 -- -- -- (!) 102 (!) 24 --  05/20/23 1451 -- -- -- (!) 105 (!) 26 100 %  05/20/23 1450 -- -- -- (!) 104 (!) 26 100 %  05/20/23 1449 -- -- -- (!) 106 (!) 26 100 %  05/20/23 1448 121/71 -- -- (!) 114 (!) 23 100 %  05/20/23 1447 -- -- -- (!) 117 (!) 32 100 %  05/20/23 1446 -- -- -- (!) 117 (!) 26 --  05/20/23 1445 -- -- -- (!) 115 (!) 25 98 %  05/20/23 1444 -- -- -- -- (!) 27 --   05/20/23 1443 -- -- -- (!) 112 (!) 24 --  05/20/23 1442 -- -- -- -- (!) 27 --  05/20/23 1441 -- -- -- (!) 112 (!) 25 --  05/20/23 1440 -- (!) 100.6 F (38.1 C) Axillary (!) 113 20 96 %  05/20/23 1439 -- -- -- (!) 117 (!) 22 --  05/20/23 1438 -- -- -- (!) 127 (!) 32 --  05/20/23 1437 -- -- -- (!) 126 (!) 31 --  05/20/23 1436 -- -- -- (!) 107 19 --  05/20/23 1435 -- -- -- 97 (!) 24 --  05/20/23 1434 -- -- -- 91 (!) 28 --  05/20/23 1433 -- -- --  89 (!) 27 --  05/20/23 1432 -- -- -- 95 (!) 24 --  05/20/23 1431 -- -- -- 93 (!) 28 --  05/20/23 1430 117/83 -- -- 93 (!) 27 98 %  05/20/23 1429 -- -- -- 91 (!) 31 --  05/20/23 1428 -- -- -- 96 (!) 26 98 %  05/20/23 1427 -- -- -- 100 (!) 29 98 %  05/20/23 1426 -- -- -- (!) 103 (!) 32 99 %  05/20/23 1425 -- -- -- 100 20 99 %  05/20/23 1424 (!) 108/91 -- -- (!) 108 (!) 23 98 %  05/20/23 1423 -- -- -- (!) 102 20 98 %  05/20/23 1422 -- -- -- 94 (!) 25 98 %  05/20/23 1421 -- -- -- 94 (!) 25 98 %  05/20/23 1420 -- -- -- 94 20 97 %  05/20/23 1419 -- -- -- 92 (!) 25 98 %  05/20/23 1418 -- -- -- 93 (!) 24 97 %  05/20/23 1417 -- -- -- 92 (!) 24 97 %  05/20/23 1416 -- -- -- 94 (!) 25 98 %  05/20/23 1415 124/74 -- -- (!) 106 (!) 25 97 %  05/20/23 1414 -- -- -- (!) 117 (!) 24 98 %  05/20/23 1413 -- -- -- 99 20 100 %  05/20/23 1412 -- -- -- 95 (!) 25 99 %  05/20/23 1411 -- -- -- 92 (!) 21 99 %  05/20/23 1410 -- -- -- 91 (!) 22 100 %  05/20/23 1409 -- -- -- 93 (!) 23 99 %  05/20/23 1408 -- -- -- 93 20 97 %  05/20/23 1407 -- -- -- 86 (!) 21 97 %  05/20/23 1406 -- -- -- 89 (!) 22 97 %  05/20/23 1405 -- -- -- 92 15 98 %  05/20/23 1404 -- -- -- 92 (!) 24 97 %  05/20/23 1403 -- -- -- (!) 119 (!) 21 95 %  05/20/23 1402 -- -- -- (!) 117 (!) 23 97 %  05/20/23 1401 -- -- -- 98 (!) 26 96 %  05/20/23 1400 114/72 -- -- 93 (!) 23 97 %  05/20/23 1359 -- -- -- 90 (!) 23 98 %  05/20/23 1358 -- -- -- 92 (!) 23 98 %  05/20/23 1357 -- -- -- 87 (!) 25 98 %   05/20/23 1356 -- -- -- 90 (!) 25 98 %  05/20/23 1355 -- -- -- 91 (!) 25 98 %  05/20/23 1354 -- -- -- 93 19 98 %  05/20/23 1353 -- -- -- 93 20 98 %  05/20/23 1352 -- -- -- 93 (!) 29 98 %  05/20/23 1351 -- -- -- (!) 101 (!) 27 98 %  05/20/23 1350 -- -- -- 94 (!) 23 98 %  05/20/23 1349 -- -- -- 96 (!) 23 98 %  05/20/23 1348 -- -- -- 97 (!) 27 98 %  05/20/23 1347 -- -- -- (!) 105 (!) 32 98 %  05/20/23 1346 -- -- -- (!) 101 (!) 21 98 %  05/20/23 1345 119/68 -- -- (!) 152 (!) 29 98 %  05/20/23 1344 -- -- -- (!) 101 (!) 29 99 %  05/20/23 1343 -- -- -- (!) 108 (!) 23 99 %  05/20/23 1342 -- -- -- (!) 110 (!) 26 99 %  05/20/23 1341 -- -- -- (!) 110 (!) 29 99 %  05/20/23 1340 -- -- -- (!) 110 (!) 31 98 %  05/20/23 1339 -- -- --   98 (!) 27 98 %  05/20/23 1338 -- -- -- -- (!) 27 --  05/20/23 1337 111/68 98.3 F (36.8 C) Oral -- -- --   NAD Abdomen: soft, mildly distended, +BS, approp ttp, c/d/I honeycomb dressing GU: foley in place and clearing UOP, now just slight pink tinge; approximately in bag. No VB. Old blood on pad, which patient states was changed to a new one when she got up there from the PACU. Approximately old blood on it. Jada at and turned off and of fluid removed from vaginal balloon. PT 15.4/INR 1.2/PTT 33/fibrinogen 443/d-dimer 9/platelets 252     Latest Ref Rng & Units 05/20/2023    5:09 PM 05/19/2023    5:00 PM 05/18/2023    3:03 PM  CMP  Glucose 70 - 99 mg/dL 161  98  77   BUN 6 - 20 mg/dL <5  <5  <5   Creatinine 0.44 - 1.00 mg/dL 0.96  0.45  4.09   Sodium 135 - 145 mmol/L 136  137  137   Potassium 3.5 - 5.1 mmol/L 3.4  3.7  3.1   Chloride 98 - 111 mmol/L 110  108  106   CO2 22 - 32 mmol/L 16  19  19    Calcium 8.9 - 10.3 mg/dL 8.6  8.6  8.2   Total Protein 6.5 - 8.1 g/dL 5.1   5.7   Total Bilirubin 0.0 - 1.2 mg/dL 1.1   0.9   Alkaline Phos 38 - 126 U/L 136   173   AST 15 - 41 U/L 38   25   ALT 0 - 44 U/L 13   13     A/P: pt doing  well Come back in approximately 30-44m and if doing well, fully d/c Jada. F/u pending labs. CBC not drawn with 1700 labs so lab asked to add this on. First dose of unasyn to be hung; continue this for 24h  Cornelia Copa MD Attending [facility] [employer] (Faculty Practice) 05/20/2023 Time: [phone]

## 2023-05-20 NOTE — Progress Notes (Signed)
Patient ID: Brand Males, female   DOB: 06-29-2000, 23 y.o.   MRN: [id number]  Labor Progress Note Joan Kim is a 23 y.o. G2P0010 at [redacted]w[redacted]d presented for IOL for PEC w/o SF, A1GDM and LGA (EFW 3876 at [redacted]w[redacted]d)  S:  Pt with intense rectal pressure, pushing well with contractions. FOB and sister at bedside for support.  O:  BP 133/73   Pulse (!) 111   Temp 98.2 F (36.8 C) (Oral)   Resp 19   Ht 5\' 6"  (1.676 m)   Wt 202 lb 6.4 oz (91.8 kg)   LMP 08/19/2022   SpO2 99%   BMI 32.67 kg/m  EFM: baseline 135 bpm/ moderate variability/ 15x15 accels/ no decels  Toco/IUPC: q3-91min SVE: Dilation: 10 Dilation Complete Date: 05/20/23 Dilation Complete Time: 0501 Effacement (%): 100 Station: Plus 2 Presentation: Vertex Exam by:: Troy Sine RN Pitocin: 32 mu/min  A/P: 23 y.o. G2P0010 [redacted]w[redacted]d  1. Labor: 2nd stage, pushing with contractions. 2. FWB: Cat 1 3. Pain: epidural in place, feeling pressure but no pain 4. PEC: denies HA/epigastric pain, BP stable  Bernerd Limbo, CNM 10:08 AM

## 2023-05-20 NOTE — Progress Notes (Signed)
When checking patients fluids currently running, no LRS going but ordered, and the unasyn dose was not given (was piggybacked but full and the primary was not lower then the secondary).  Added another channel and started the LR.  Had to program the unasyn a couple times to the new channel.

## 2023-05-20 NOTE — Transfer of Care (Signed)
Immediate Anesthesia Transfer of Care Note  Patient: Joan Kim  Procedure(s) Performed: CESAREAN SECTION  Patient Location: PACU  Anesthesia Type:Epidural  Level of Consciousness: awake  Airway & Oxygen Therapy: Patient Spontanous Breathing  Post-op Assessment: Report given to RN and Post -op Vital signs reviewed and stable  Post vital signs: Reviewed and stable  Last Vitals:  Vitals Value Taken Time  BP 111/68 05/20/23 1337  Temp    Pulse 98 05/20/23 1339  Resp 22 05/20/23 1339  SpO2 98 % 05/20/23 1339  Vitals shown include unfiled device data.  Last Pain:  Vitals:   05/20/23 1010  TempSrc: Axillary  PainSc:          Complications: No notable events documented.

## 2023-05-20 NOTE — Progress Notes (Signed)
LABOR PROGRESS NOTE  Patient Name: Joan Kim, female   DOB: 09/16/2000, 23 y.o.  MRN: [id number]  Patient with recent redose of epidural. Having difficulty coping with labor shakes and increased pressure. Discussed normalcy of these findings. Cervix 10/100/-2. Discussed time for laboring down as babe is still high in pelvis. No caput. Continue pit. Cat I.  Joanne Gavel, MD

## 2023-05-20 NOTE — Progress Notes (Signed)
Pharmacy Antibiotic Note  Joan Kim is a 23 y.o. female admitted on 05/18/2023 for IOL at 38w due to mild pre-eclampsia and A1GDM.  Pharmacy has been consulted for Unasyn dosing due to JADA device placed post C-section.  Plan: Unasyn 3 gram IV q6h x 24 hours No further dosage adjustment necessary  Height: 5\' 6"  (167.6 cm) Weight: 91.8 kg (202 lb 6.4 oz) IBW/kg (Calculated) : 59.3  Temp (24hrs), Avg:100 F (37.8 C), Min:98.2 F (36.8 C), Max:102.7 F (39.3 C)  Recent Labs  Lab 05/16/23 1249 05/18/23 1503 05/19/23 1034 05/19/23 1700 05/20/23 1351  WBC 8.8 8.0 10.7* 14.0* 23.1*  CREATININE 0.47* 0.46  --  0.45  --     Estimated Creatinine Clearance: 124.8 mL/min (by C-G formula based on SCr of 0.45 mg/dL).    Allergies  Allergen Reactions   Latex Rash    Antimicrobials this admission: Azithromycin 500mg  IV x 1 2/21 preop Cefazolin 2 gram IV x 1 2/21 preop    Thank you for allowing pharmacy to be a part of this patient's care.  Claybon Jabs 05/20/2023 5:29 PM

## 2023-05-20 NOTE — Progress Notes (Signed)
Bleeding noted around [place] on admission to [facility] Suction was restarted upon patient entering the room and staff evaluated for several minutes and bleeding continued. Dr. Debroah Loop was called and came to bedside to assess. EBL on Mother baby unit was weighed and . Dr. Debroah Loop aware. He said he would reevaluate in 45 min and no new orders were given. Royston Cowper, RN

## 2023-05-20 NOTE — Progress Notes (Addendum)
Called to PACU for bleeding. Patient is about an hour post-op. Fundus high and boggy with steady trickle from the vagina. Patient received txa x1 in the OR as well as pitocin. A second IV was started. DIC panel obtained. 1 liter saline bolus ordered. Second dose txa ordered as well as cytotec 800 mcg buccal and one IM dose of methergine (BPs have been well within normal limits this labor). Manual sweep performed with significant amount of clot evacuated, boggy uterus. Decision made to place JADA device. This was placed and a steady amount of blood returned and continued to return. Decision made to remove the devise and re-insert, this time taking extra time to ensure tip of jada at fundus and that the device is otherwise properly aligned. Seal re-inflated and attached to wall suction, this time relatively rapid improvement in bleeding, nothing significant from vagina. Starting hgb 9.5, hemodynamically stable but a bit confused and pale, decision made to activate mtp protocol for rapid administration of 2 units prbcs and 2 units ffp. Initial dic panel result showing normal platelets and inr so ffp canceled for now, will transfuse the two units and monitor labs closely. Will plan to also check calcium with next set of labs.

## 2023-05-21 LAB — COMPREHENSIVE METABOLIC PANEL
ALT: 12 U/L (ref 0–44)
AST: 35 U/L (ref 15–41)
Albumin: 1.5 g/dL — ABNORMAL LOW (ref 3.5–5.0)
Alkaline Phosphatase: 106 U/L (ref 38–126)
Anion gap: 8 (ref 5–15)
BUN: <5 mg/dL — ABNORMAL LOW (ref 6–20)
CO2: 21 mmol/L — ABNORMAL LOW (ref 22–32)
Calcium: 7.8 mg/dL — ABNORMAL LOW (ref 8.9–10.3)
Chloride: 108 mmol/L (ref 98–111)
Creatinine, Ser: 0.57 mg/dL (ref 0.44–1.00)
GFR, Estimated: >60 mL/min (ref >60)
Glucose, Bld: 81 mg/dL (ref 70–99)
Potassium: 3.5 mmol/L (ref 3.5–5.1)
Sodium: 137 mmol/L (ref 135–145)
Total Bilirubin: 0.4 mg/dL (ref 0.0–1.2)
Total Protein: 3.8 g/dL — ABNORMAL LOW (ref 6.5–8.1)

## 2023-05-21 LAB — CBC
HCT: 24.4 % — ABNORMAL LOW (ref 36.0–46.0)
Hemoglobin: 8 g/dL — ABNORMAL LOW (ref 12.0–15.0)
MCH: 26.2 pg (ref 26.0–34.0)
MCHC: 32.8 g/dL (ref 30.0–36.0)
MCV: 80 fL (ref 80.0–100.0)
Platelets: 192 10*3/uL (ref 150–400)
RBC: 3.05 MIL/uL — ABNORMAL LOW (ref 3.87–5.11)
RDW: 15.4 % (ref 11.5–15.5)
WBC: 16.6 10*3/uL — ABNORMAL HIGH (ref 4.0–10.5)
nRBC: 0 % (ref 0.0–0.2)

## 2023-05-21 MED ORDER — FERROUS GLUCONATE 324 (38 FE) MG PO TABS
324.0000 mg | ORAL_TABLET | ORAL | Status: DC
  Administered 2023-05-21 – 2023-05-23 (×2): 324 mg via ORAL
  Filled 2023-05-21 (×3): qty 1

## 2023-05-21 NOTE — Progress Notes (Signed)
 MOB was referred for history of depression/anxiety. * Referral screened out by Clinical Social Worker because none of the following criteria appear to apply: ~ History of anxiety/depression during this pregnancy, or of post-partum depression following prior delivery. ~ Diagnosis of anxiety and/or depression within last 3 years OR * MOB's symptoms currently being treated with medication and/or therapy. MOB is prescribed Zoloft and Buspar.  Please contact the Clinical Social Worker if needs arise, by [facility] request, or if MOB scores greater than 9/yes to question 10 on Edinburgh Postpartum Depression Screen.  Signed,  Norberto Sorenson, MSW, [place], Bridget Hartshorn 2023/06/10 9:13 AM

## 2023-05-21 NOTE — Progress Notes (Signed)
 POSTPARTUM PROGRESS NOTE  Subjective: Faythe D Kovacic is a 23 y.o. G2P1011 s/p pLTCS at [redacted]w[redacted]d.  She reports she is doing well. No acute events overnight. She denies any problems with ambulating, voiding or po intake. Denies nausea or vomiting. She has passed flatus. Pain is well controlled.  Lochia is light.  Objective: Blood pressure 122/77, pulse 80, temperature 97.8 F (36.6 C), temperature source Oral, resp. rate 18, height 5\' 6"  (1.676 m), weight 91.8 kg, last menstrual period 08/19/2022, SpO2 100%, unknown if currently breastfeeding.  Physical Exam:  General: alert, cooperative and no distress Chest: no respiratory distress Abdomen: soft, non-tender  Uterine Fundus: firm and at level of umbilicus Extremities: SCD on and running  Trace edema Foley in with dark yellow urine  Recent Labs    05/20/23 1922 05/21/23 0444  HGB 9.3* 8.0*  HCT 28.3* 24.4*    Assessment/Plan: Shalissa D Waterbury is a 23 y.o. G2P1011 s/p pLTCS at [redacted]w[redacted]d for AoDes.  Routine Postpartum Care: Doing well, pain well-controlled.  -- Continue routine care, lactation support  -- Contraception: declines -- Feeding: bottle  -- ABLA: s/p 2 U pRBCs. Hgb 8 this AM. Start PO iron -- A1GDM: fasting sugar pending  Dispo: Plan for discharge 2/23-2/24.  Joanne Gavel, MD OB Fellow 05/21/2023 7:15 AM

## 2023-05-22 LAB — TYPE AND SCREEN
ABO/RH(D): A POS
Antibody Screen: NEGATIVE
Unit division: 0
Unit division: 0
Unit division: 0
Unit division: 0
Unit division: 0
Unit division: 0
Unit division: 0
Unit division: 0

## 2023-05-22 LAB — BPAM RBC
Blood Product Expiration Date: 202503152359
Blood Product Expiration Date: 202503172359
Blood Product Expiration Date: 202503182359
Blood Product Expiration Date: 202503192359
Blood Product Expiration Date: 202503202359
Blood Product Expiration Date: 202503202359
Blood Product Expiration Date: 202503202359
Blood Product Expiration Date: 202503202359
ISSUE DATE / TIME: 202502211418
ISSUE DATE / TIME: 202502211418
ISSUE DATE / TIME: 202502211427
ISSUE DATE / TIME: 202502211427
ISSUE DATE / TIME: 202502211438
ISSUE DATE / TIME: 202502211438
ISSUE DATE / TIME: 202502212223
ISSUE DATE / TIME: 202502220057
Unit Type and Rh: 6200
Unit Type and Rh: 6200
Unit Type and Rh: 6200
Unit Type and Rh: 6200
Unit Type and Rh: 6200
Unit Type and Rh: 6200
Unit Type and Rh: 6200
Unit Type and Rh: 6200

## 2023-05-22 MED ORDER — POTASSIUM CHLORIDE CRYS ER 20 MEQ PO TBCR
20.0000 meq | EXTENDED_RELEASE_TABLET | Freq: Every day | ORAL | Status: DC
  Administered 2023-05-23: 20 meq via ORAL
  Filled 2023-05-22: qty 1

## 2023-05-22 NOTE — Progress Notes (Signed)
 POSTPARTUM PROGRESS NOTE  POD #2  Subjective:  Joan Kim is a 23 y.o. G2P1011 s/p pLTCS at [redacted]w[redacted]d. No acute events overnight. She reports she is doing well. She denies any problems with ambulating, voiding or po intake. Denies nausea or vomiting. She has  passed flatus. Pain is moderately controlled.  Lochia is adequate.  Objective: Blood pressure 126/89, pulse 66, temperature 98 F (36.7 C), resp. rate 18, height 5\' 6"  (1.676 m), weight 91.8 kg, last menstrual period 08/19/2022, SpO2 99%, unknown if currently breastfeeding.  Physical Exam:  General: alert, cooperative and no distress Chest: no respiratory distress Heart: regular rate, distal pulses intact Uterine Fundus: firm, appropriately tender DVT Evaluation: No calf swelling or tenderness Extremities: trace edema Skin: warm, dry; incision clean/dry/intact w/ honeycomb dressing in place  Recent Labs    05/20/23 1922 05/21/23 0444  HGB 9.3* 8.0*  HCT 28.3* 24.4*    Assessment/Plan: Joan Kim is a 23 y.o. G2P1011 s/p pLTCS at [redacted]w[redacted]d for AODescent w/ PPH 1300cc.  POD#2 - Doing welll; pain only moderately controlled. H/H appropriate  Routine postpartum care  OOB, ambulated  Lovenox for VTE prophylaxis Postpartum Hemorrhage, 1300 QBL: asymptomatic, Hgb 9.3>8.0  Start po ferrous sulfate every other day Mild PreE: PP pressures normotensive on Lasix, will continue on K oral supplementation as well given K 3.5 2/22 Contraception: declined Feeding: both  Dispo: Plan for discharge 2/24 once better pain control.   LOS: 4 days   Hessie Dibble, MD OB Fellow  05/22/2023, 7:00 AM

## 2023-05-23 ENCOUNTER — Other Ambulatory Visit: Payer: Self-pay

## 2023-05-23 ENCOUNTER — Other Ambulatory Visit (HOSPITAL_COMMUNITY): Payer: Self-pay

## 2023-05-23 ENCOUNTER — Encounter (HOSPITAL_COMMUNITY): Payer: Self-pay | Admitting: Obstetrics and Gynecology

## 2023-05-23 LAB — MAGNESIUM: Magnesium: 1.6 mg/dL — ABNORMAL LOW (ref 1.7–2.4)

## 2023-05-23 MED ORDER — IBUPROFEN 600 MG PO TABS
600.0000 mg | ORAL_TABLET | Freq: Four times a day (QID) | ORAL | 0 refills | Status: DC
  Filled 2023-05-23: qty 30, 8d supply, fill #0

## 2023-05-23 MED ORDER — SENNOSIDES-DOCUSATE SODIUM 8.6-50 MG PO TABS
2.0000 | ORAL_TABLET | Freq: Two times a day (BID) | ORAL | 0 refills | Status: DC | PRN
  Filled 2023-05-23: qty 30, 8d supply, fill #0

## 2023-05-23 MED ORDER — OXYCODONE HCL 5 MG PO TABS
5.0000 mg | ORAL_TABLET | ORAL | 0 refills | Status: DC | PRN
  Filled 2023-05-23: qty 10, 2d supply, fill #0

## 2023-05-23 MED ORDER — POTASSIUM CHLORIDE CRYS ER 20 MEQ PO TBCR
20.0000 meq | EXTENDED_RELEASE_TABLET | Freq: Every day | ORAL | 0 refills | Status: DC
  Filled 2023-05-23: qty 5, 5d supply, fill #0

## 2023-05-23 MED ORDER — FUROSEMIDE 20 MG PO TABS
20.0000 mg | ORAL_TABLET | Freq: Every day | ORAL | 0 refills | Status: DC
  Filled 2023-05-23: qty 5, 5d supply, fill #0

## 2023-05-23 MED ORDER — FERROUS GLUCONATE 324 (38 FE) MG PO TABS
324.0000 mg | ORAL_TABLET | ORAL | 3 refills | Status: DC
  Filled 2023-05-23: qty 30, 60d supply, fill #0

## 2023-05-24 ENCOUNTER — Encounter: Payer: Self-pay | Admitting: Obstetrics & Gynecology

## 2023-05-25 ENCOUNTER — Other Ambulatory Visit: Payer: Self-pay | Admitting: Family Medicine

## 2023-05-26 ENCOUNTER — Encounter: Payer: Medicaid Other | Admitting: Obstetrics and Gynecology

## 2023-05-27 ENCOUNTER — Ambulatory Visit: Payer: Medicaid Other

## 2023-05-27 VITALS — BP 127/85 | HR 105 | Wt 177.0 lb

## 2023-05-27 DIAGNOSIS — Z0130 Encounter for examination of blood pressure without abnormal findings: Secondary | ICD-10-CM

## 2023-05-27 DIAGNOSIS — Z98891 History of uterine scar from previous surgery: Secondary | ICD-10-CM

## 2023-05-27 NOTE — Progress Notes (Signed)
 Subjective:  Joan Kim is a 23 y.o. female here for BP check and incision check.  Hypertension ROS: no medication side effects noted, no TIA's, no chest pain on exertion, no dyspnea on exertion, and no swelling of ankles. Pt not on any B/P Rx.  Pt reports incision healing well  Objective:  LMP 08/19/2022   Appearance alert, well appearing, and in no distress. Incision healing well   Assessment:   Blood Pressure today in office well controlled.  Incision healed nicely, no signs of infection, scar looks good  Plan:  Discussed precautions with patient and advised when to contact the office for any issues or concerns pt agreeable . Marland Kitchen

## 2023-06-03 ENCOUNTER — Emergency Department (HOSPITAL_COMMUNITY)

## 2023-06-03 ENCOUNTER — Other Ambulatory Visit: Payer: Self-pay

## 2023-06-03 ENCOUNTER — Encounter (HOSPITAL_COMMUNITY): Payer: Self-pay

## 2023-06-03 ENCOUNTER — Inpatient Hospital Stay (HOSPITAL_COMMUNITY)
Admission: EM | Admit: 2023-06-03 | Discharge: 2023-06-06 | DRG: 776 | Disposition: A | Discharge status: Home / Self Care | Attending: Internal Medicine | Admitting: Internal Medicine

## 2023-06-03 DIAGNOSIS — A419 Sepsis, unspecified organism: Secondary | ICD-10-CM | POA: Diagnosis present

## 2023-06-03 DIAGNOSIS — O9953 Diseases of the respiratory system complicating the puerperium: Secondary | ICD-10-CM | POA: Diagnosis present

## 2023-06-03 DIAGNOSIS — K219 Gastro-esophageal reflux disease without esophagitis: Secondary | ICD-10-CM | POA: Diagnosis present

## 2023-06-03 DIAGNOSIS — O85 Puerperal sepsis: Principal | ICD-10-CM | POA: Diagnosis present

## 2023-06-03 DIAGNOSIS — N390 Urinary tract infection, site not specified: Principal | ICD-10-CM

## 2023-06-03 DIAGNOSIS — B9620 Unspecified Escherichia coli [E. coli] as the cause of diseases classified elsewhere: Secondary | ICD-10-CM | POA: Diagnosis present

## 2023-06-03 DIAGNOSIS — O99285 Endocrine, nutritional and metabolic diseases complicating the puerperium: Secondary | ICD-10-CM | POA: Diagnosis present

## 2023-06-03 DIAGNOSIS — N10 Acute tubulo-interstitial nephritis: Secondary | ICD-10-CM | POA: Diagnosis present

## 2023-06-03 DIAGNOSIS — R509 Fever, unspecified: Secondary | ICD-10-CM | POA: Diagnosis present

## 2023-06-03 DIAGNOSIS — F411 Generalized anxiety disorder: Secondary | ICD-10-CM | POA: Diagnosis present

## 2023-06-03 DIAGNOSIS — Z91040 Latex allergy status: Secondary | ICD-10-CM

## 2023-06-03 DIAGNOSIS — O8621 Infection of kidney following delivery: Secondary | ICD-10-CM | POA: Diagnosis present

## 2023-06-03 DIAGNOSIS — D649 Anemia, unspecified: Secondary | ICD-10-CM | POA: Diagnosis present

## 2023-06-03 DIAGNOSIS — O8622 Infection of bladder following delivery: Secondary | ICD-10-CM | POA: Diagnosis present

## 2023-06-03 DIAGNOSIS — O9903 Anemia complicating the puerperium: Secondary | ICD-10-CM | POA: Diagnosis present

## 2023-06-03 DIAGNOSIS — O99345 Other mental disorders complicating the puerperium: Secondary | ICD-10-CM | POA: Diagnosis present

## 2023-06-03 DIAGNOSIS — D72829 Elevated white blood cell count, unspecified: Secondary | ICD-10-CM | POA: Diagnosis present

## 2023-06-03 DIAGNOSIS — J4520 Mild intermittent asthma, uncomplicated: Secondary | ICD-10-CM | POA: Diagnosis present

## 2023-06-03 DIAGNOSIS — Z79890 Hormone replacement therapy (postmenopausal): Secondary | ICD-10-CM

## 2023-06-03 DIAGNOSIS — N3289 Other specified disorders of bladder: Secondary | ICD-10-CM | POA: Diagnosis not present

## 2023-06-03 DIAGNOSIS — E876 Hypokalemia: Secondary | ICD-10-CM | POA: Diagnosis present

## 2023-06-03 DIAGNOSIS — R000 Tachycardia, unspecified: Secondary | ICD-10-CM | POA: Diagnosis not present

## 2023-06-03 DIAGNOSIS — N12 Tubulo-interstitial nephritis, not specified as acute or chronic: Secondary | ICD-10-CM

## 2023-06-03 DIAGNOSIS — Z87440 Personal history of urinary (tract) infections: Secondary | ICD-10-CM

## 2023-06-03 DIAGNOSIS — Z79899 Other long term (current) drug therapy: Secondary | ICD-10-CM

## 2023-06-03 DIAGNOSIS — O9963 Diseases of the digestive system complicating the puerperium: Secondary | ICD-10-CM | POA: Diagnosis present

## 2023-06-03 DIAGNOSIS — Z1152 Encounter for screening for COVID-19: Secondary | ICD-10-CM

## 2023-06-03 DIAGNOSIS — R5082 Postprocedural fever: Secondary | ICD-10-CM

## 2023-06-03 DIAGNOSIS — Z8249 Family history of ischemic heart disease and other diseases of the circulatory system: Secondary | ICD-10-CM

## 2023-06-03 LAB — COMPREHENSIVE METABOLIC PANEL
ALT: 27 U/L (ref 0–44)
AST: 28 U/L (ref 15–41)
Albumin: 3.5 g/dL (ref 3.5–5.0)
Alkaline Phosphatase: 113 U/L (ref 38–126)
Anion gap: 12 (ref 5–15)
BUN: 9 mg/dL (ref 6–20)
CO2: 19 mmol/L — ABNORMAL LOW (ref 22–32)
Calcium: 8.7 mg/dL — ABNORMAL LOW (ref 8.9–10.3)
Chloride: 102 mmol/L (ref 98–111)
Creatinine, Ser: 0.73 mg/dL (ref 0.44–1.00)
GFR, Estimated: >60 mL/min (ref >60)
Glucose, Bld: 117 mg/dL — ABNORMAL HIGH (ref 70–99)
Potassium: 3.3 mmol/L — ABNORMAL LOW (ref 3.5–5.1)
Sodium: 133 mmol/L — ABNORMAL LOW (ref 135–145)
Total Bilirubin: 0.9 mg/dL (ref 0.0–1.2)
Total Protein: 8.2 g/dL — ABNORMAL HIGH (ref 6.5–8.1)

## 2023-06-03 LAB — CBC WITH DIFFERENTIAL/PLATELET
Abs Immature Granulocytes: 0.12 10*3/uL — ABNORMAL HIGH (ref 0.00–0.07)
Basophils Absolute: 0 10*3/uL (ref 0.0–0.1)
Basophils Relative: 0 %
Eosinophils Absolute: 0 10*3/uL (ref 0.0–0.5)
Eosinophils Relative: 0 %
HCT: 35.5 % — ABNORMAL LOW (ref 36.0–46.0)
Hemoglobin: 11.1 g/dL — ABNORMAL LOW (ref 12.0–15.0)
Immature Granulocytes: 1 %
Lymphocytes Relative: 8 %
Lymphs Abs: 1.7 10*3/uL (ref 0.7–4.0)
MCH: 25.1 pg — ABNORMAL LOW (ref 26.0–34.0)
MCHC: 31.3 g/dL (ref 30.0–36.0)
MCV: 80.3 fL (ref 80.0–100.0)
Monocytes Absolute: 1.4 10*3/uL — ABNORMAL HIGH (ref 0.1–1.0)
Monocytes Relative: 7 %
Neutro Abs: 17 10*3/uL — ABNORMAL HIGH (ref 1.7–7.7)
Neutrophils Relative %: 84 %
Platelets: 445 10*3/uL — ABNORMAL HIGH (ref 150–400)
RBC: 4.42 MIL/uL (ref 3.87–5.11)
RDW: 15.2 % (ref 11.5–15.5)
WBC: 20.3 10*3/uL — ABNORMAL HIGH (ref 4.0–10.5)
nRBC: 0 % (ref 0.0–0.2)

## 2023-06-03 LAB — RESP PANEL BY RT-PCR (RSV, FLU A&B, COVID)  RVPGX2
Influenza A by PCR: NEGATIVE
Influenza B by PCR: NEGATIVE
Resp Syncytial Virus by PCR: NEGATIVE
SARS Coronavirus 2 by RT PCR: NEGATIVE

## 2023-06-03 LAB — URINALYSIS, W/ REFLEX TO CULTURE (INFECTION SUSPECTED)
Bilirubin Urine: NEGATIVE
Glucose, UA: NEGATIVE mg/dL
Ketones, ur: NEGATIVE mg/dL
Nitrite: NEGATIVE
Protein, ur: 100 mg/dL — AB
RBC / HPF: >50 RBC/hpf (ref 0–5)
Specific Gravity, Urine: 1.015 (ref 1.005–1.030)
WBC, UA: >50 WBC/hpf (ref 0–5)
pH: 6 (ref 5.0–8.0)

## 2023-06-03 LAB — PROTIME-INR
INR: 1.1 (ref 0.8–1.2)
Prothrombin Time: 14.6 s (ref 11.4–15.2)

## 2023-06-03 LAB — APTT: aPTT: 36 s (ref 24–36)

## 2023-06-03 LAB — I-STAT CG4 LACTIC ACID, ED: Lactic Acid, Venous: 1.3 mmol/L (ref 0.5–1.9)

## 2023-06-03 MED ORDER — METRONIDAZOLE 500 MG/100ML IV SOLN
500.0000 mg | Freq: Once | INTRAVENOUS | Status: AC
  Administered 2023-06-03: 500 mg via INTRAVENOUS
  Filled 2023-06-03: qty 100

## 2023-06-03 MED ORDER — LACTATED RINGERS IV BOLUS
1000.0000 mL | Freq: Once | INTRAVENOUS | Status: AC
  Administered 2023-06-03: 1000 mL via INTRAVENOUS

## 2023-06-03 MED ORDER — IOHEXOL 300 MG/ML  SOLN
100.0000 mL | Freq: Once | INTRAMUSCULAR | Status: AC | PRN
  Administered 2023-06-03: 100 mL via INTRAVENOUS

## 2023-06-03 MED ORDER — SODIUM CHLORIDE 0.9 % IV SOLN
2.0000 g | Freq: Once | INTRAVENOUS | Status: AC
  Administered 2023-06-03: 2 g via INTRAVENOUS
  Filled 2023-06-03: qty 12.5

## 2023-06-03 NOTE — ED Provider Notes (Signed)
 [place] EMERGENCY DEPARTMENT AT [facility] Provider Note   CSN: [id number] Arrival date & time: 06/03/23  1952     History  Chief Complaint  Patient presents with   Fever    Joan Kim is a 23 y.o. female.   Fever Patient resents with fever abdominal pain and back pain.  Began earlier tonight.  Temperature 100.6 upon arrival.  Also tachycardic.  Is 2 weeks postpartum with C-section.  States she was transfused blood.  Has some dysuria.  No known sick contacts.  States she has had some change in her vaginal discharge.     Home Medications Prior to Admission medications   Medication Sig Start Date End Date Taking? Authorizing Provider  Accu-Chek Softclix Lancets lancets 1 each by Other route 4 (four) times daily. 03/11/23   Constant, Peggy, MD  acetaminophen (TYLENOL) 325 MG tablet Take 650 mg by mouth every 6 (six) hours as needed.    [provider]  albuterol (VENTOLIN HFA) 108 (90 Base) MCG/ACT inhaler INHALE 2 PUFFS INTO THE LUNGS EVERY 4 HOURS AS NEEDED FOR WHEEZING OR SHORTNESS OF BREATH. 12/13/22   Reva Bores, MD  Blood Glucose Monitoring Suppl (ACCU-CHEK GUIDE) w/Device KIT 1 Device by Does not apply route 4 (four) times daily. 03/11/23   Constant, Peggy, MD  busPIRone (BUSPAR) 5 MG tablet Take 5 mg by mouth 3 (three) times daily.    [provider]  cyclobenzaprine (FLEXERIL) 5 MG tablet Take 1 tablet (5 mg total) by mouth every 8 (eight) hours as needed. Patient not taking: Reported on 05/13/2023 04/05/23   Aviva Signs, CNM  famotidine (PEPCID) 20 MG tablet Take 1 tablet (20 mg total) by mouth 2 (two) times daily. Patient not taking: Reported on 05/05/2023 01/13/23   Federico Flake, MD  Ferric Maltol 30 MG CAPS Take 1 capsule (30 mg total) by mouth 2 (two) times daily. Please take one hour before breakfast and dinner 04/11/23   Anyanwu, Jethro Bastos, MD  ferrous gluconate (FERGON) 324 MG tablet Take 1 tablet (324 mg total) by  mouth every other day. 05/23/23   Hessie Dibble, MD  furosemide (LASIX) 20 MG tablet Take 1 tablet (20 mg total) by mouth daily. 05/23/23   Hessie Dibble, MD  glucose blood test strip Use as instructed 03/11/23   Constant, Peggy, MD  ibuprofen (ADVIL) 600 MG tablet Take 1 tablet (600 mg total) by mouth every 6 (six) hours. 05/23/23   Hessie Dibble, MD  oxyCODONE (OXY IR/ROXICODONE) 5 MG immediate release tablet Take 1 tablet (5 mg total) by mouth every 4 (four) hours as needed for moderate pain (pain score 4-6). 05/23/23   Hessie Dibble, MD  potassium chloride SA (KLOR-CON M) 20 MEQ tablet Take 1 tablet (20 mEq total) by mouth daily. 05/23/23   Hessie Dibble, MD  Prenatal Multivit-Min-Fe-FA (PRENATAL/IRON) TABS Take 1 tablet by mouth daily. 01/28/20   Meccariello, Solmon Ice, MD  promethazine (PHENERGAN) 25 MG tablet Take 25 mg by mouth every 6 (six) hours as needed for nausea or vomiting.    [provider]  senna-docusate (SENOKOT-S) 8.6-50 MG tablet Take 2 tablets by mouth 2 (two) times daily as needed for mild constipation. 05/23/23   Hessie Dibble, MD  sertraline (ZOLOFT) 100 MG tablet Take 1 tablet (100 mg total) by mouth daily. 10/08/22   Federico Flake, MD      Allergies    Latex  Review of Systems   Review of Systems  Constitutional:  Positive for fever.    Physical Exam Updated Vital Signs BP 133/80   Pulse (!) 110   Temp 100.3 F (37.9 C) (Oral)   Resp (!) 25   Ht 5\' 6"  (1.676 m)   Wt 80.3 kg   LMP 08/19/2022   SpO2 99%   BMI 28.57 kg/m  Physical Exam Vitals and nursing note reviewed.  Eyes:     Pupils: Pupils are equal, round, and reactive to light.  Cardiovascular:     Rate and Rhythm: Tachycardia present.  Abdominal:     Comments: Mild abdominal tenderness.  C-section incision healing well.  Skin:    General: Skin is warm.  Neurological:     Mental Status: She is alert and oriented to person, place, and time.     ED Results / Procedures /  Treatments   Labs (all labs ordered are listed, but only abnormal results are displayed) Labs Reviewed  COMPREHENSIVE METABOLIC PANEL - Abnormal; Notable for the following components:      Result Value   Sodium 133 (*)    Potassium 3.3 (*)    CO2 19 (*)    Glucose, Bld 117 (*)    Calcium 8.7 (*)    Total Protein 8.2 (*)    All other components within normal limits  CBC WITH DIFFERENTIAL/PLATELET - Abnormal; Notable for the following components:   WBC 20.3 (*)    Hemoglobin 11.1 (*)    HCT 35.5 (*)    MCH 25.1 (*)    Platelets 445 (*)    Neutro Abs 17.0 (*)    Monocytes Absolute 1.4 (*)    Abs Immature Granulocytes 0.12 (*)    All other components within normal limits  URINALYSIS, W/ REFLEX TO CULTURE (INFECTION SUSPECTED) - Abnormal; Notable for the following components:   APPearance TURBID (*)    Hgb urine dipstick MODERATE (*)    Protein, ur 100 (*)    Leukocytes,Ua LARGE (*)    Bacteria, UA FEW (*)    Non Squamous Epithelial 0-5 (*)    All other components within normal limits  RESP PANEL BY RT-PCR (RSV, FLU A&B, COVID)  RVPGX2  CULTURE, BLOOD (ROUTINE X 2)  CULTURE, BLOOD (ROUTINE X 2)  URINE CULTURE  PROTIME-INR  APTT  I-STAT CG4 LACTIC ACID, ED    EKG None  Radiology DG Chest Port 1 View Result Date: 06/03/2023 CLINICAL DATA:  Fever EXAM: PORTABLE CHEST 1 VIEW COMPARISON:  None Available. FINDINGS: The heart size and mediastinal contours are within normal limits. Both lungs are clear. The visualized skeletal structures are unremarkable. IMPRESSION: No active disease. Electronically Signed   By: Annia Belt M.D.   On: 06/03/2023 23:03    Procedures Procedures    Medications Ordered in ED Medications  lactated ringers bolus 1,000 mL (1,000 mLs Intravenous New Bag/Given 06/03/23 2140)  ceFEPIme (MAXIPIME) 2 g in sodium chloride 0.9 % 100 mL IVPB (0 g Intravenous Stopped 06/03/23 2140)  metroNIDAZOLE (FLAGYL) IVPB 500 mg (0 mg Intravenous Stopped 06/03/23 2311)   iohexol (OMNIPAQUE) 300 MG/ML solution 100 mL (100 mLs Intravenous Contrast Given 06/03/23 2125)    ED Course/ Medical Decision Making/ A&P                                 Medical Decision Making Amount and/or Complexity of Data Reviewed Labs: ordered. Radiology: ordered.  Risk Prescription drug management.   Patient 2 weeks postpartum with C-section now with fever.  Does have abdominal pain chills and some dysuria.  Differential diagnosis does include UTI, intra-abdominal infection, endometritis.  Will get basic blood work.  Will give antibiotics.  Will give fluid bolus and check CT scan.  Lactic acid reassuring.  White count elevated.  Negative respiratory panel.  Urine shows likely infection.  Pelvic exam showed some bloody discharge potentially with some purulence.  CT scan pending.    Care turned over to Dr Elayne Snare  CRITICAL CARE Performed by: Benjiman Core Total critical care time: 30 minutes Critical care time was exclusive of separately billable procedures and treating other patients. Critical care was necessary to treat or prevent imminent or life-threatening deterioration. Critical care was time spent personally by me on the following activities: development of treatment plan with patient and/or surrogate as well as nursing, discussions with consultants, evaluation of patient's response to treatment, examination of patient, obtaining history from patient or surrogate, ordering and performing treatments and interventions, ordering and review of laboratory studies, ordering and review of radiographic studies, pulse oximetry and re-evaluation of patient's condition.         Final Clinical Impression(s) / ED Diagnoses Final diagnoses:  Urinary tract infection without hematuria, site unspecified  Postoperative fever    Rx / DC Orders ED Discharge Orders     None         Benjiman Core, MD 06/03/23 2332

## 2023-06-03 NOTE — Sepsis Progress Note (Signed)
 Elink monitoring for the code sepsis protocol.

## 2023-06-03 NOTE — ED Triage Notes (Addendum)
 Pt reports with fever, back, pain, chills, shob,and headache. Pt states that it burn when she urinates. Pt recently had a c section, no redness or drainage noted.

## 2023-06-04 ENCOUNTER — Encounter (HOSPITAL_COMMUNITY): Payer: Self-pay | Admitting: Internal Medicine

## 2023-06-04 DIAGNOSIS — Z87440 Personal history of urinary (tract) infections: Secondary | ICD-10-CM | POA: Diagnosis not present

## 2023-06-04 DIAGNOSIS — K219 Gastro-esophageal reflux disease without esophagitis: Secondary | ICD-10-CM | POA: Diagnosis not present

## 2023-06-04 DIAGNOSIS — O8621 Infection of kidney following delivery: Secondary | ICD-10-CM | POA: Diagnosis not present

## 2023-06-04 DIAGNOSIS — N10 Acute tubulo-interstitial nephritis: Secondary | ICD-10-CM

## 2023-06-04 DIAGNOSIS — O8622 Infection of bladder following delivery: Secondary | ICD-10-CM | POA: Diagnosis not present

## 2023-06-04 DIAGNOSIS — J4520 Mild intermittent asthma, uncomplicated: Secondary | ICD-10-CM

## 2023-06-04 DIAGNOSIS — N390 Urinary tract infection, site not specified: Secondary | ICD-10-CM | POA: Diagnosis not present

## 2023-06-04 DIAGNOSIS — E876 Hypokalemia: Secondary | ICD-10-CM

## 2023-06-04 DIAGNOSIS — O9953 Diseases of the respiratory system complicating the puerperium: Secondary | ICD-10-CM | POA: Diagnosis not present

## 2023-06-04 DIAGNOSIS — A419 Sepsis, unspecified organism: Secondary | ICD-10-CM | POA: Diagnosis not present

## 2023-06-04 DIAGNOSIS — O85 Puerperal sepsis: Secondary | ICD-10-CM | POA: Diagnosis not present

## 2023-06-04 DIAGNOSIS — R000 Tachycardia, unspecified: Secondary | ICD-10-CM | POA: Diagnosis not present

## 2023-06-04 DIAGNOSIS — N3289 Other specified disorders of bladder: Secondary | ICD-10-CM | POA: Diagnosis not present

## 2023-06-04 DIAGNOSIS — R509 Fever, unspecified: Secondary | ICD-10-CM | POA: Diagnosis not present

## 2023-06-04 DIAGNOSIS — D649 Anemia, unspecified: Secondary | ICD-10-CM | POA: Diagnosis not present

## 2023-06-04 DIAGNOSIS — O99345 Other mental disorders complicating the puerperium: Secondary | ICD-10-CM | POA: Diagnosis not present

## 2023-06-04 DIAGNOSIS — D72829 Elevated white blood cell count, unspecified: Secondary | ICD-10-CM

## 2023-06-04 DIAGNOSIS — Z8249 Family history of ischemic heart disease and other diseases of the circulatory system: Secondary | ICD-10-CM | POA: Diagnosis not present

## 2023-06-04 DIAGNOSIS — Z1152 Encounter for screening for COVID-19: Secondary | ICD-10-CM | POA: Diagnosis not present

## 2023-06-04 DIAGNOSIS — F411 Generalized anxiety disorder: Secondary | ICD-10-CM | POA: Diagnosis not present

## 2023-06-04 DIAGNOSIS — Z79890 Hormone replacement therapy (postmenopausal): Secondary | ICD-10-CM | POA: Diagnosis not present

## 2023-06-04 DIAGNOSIS — B9620 Unspecified Escherichia coli [E. coli] as the cause of diseases classified elsewhere: Secondary | ICD-10-CM | POA: Diagnosis not present

## 2023-06-04 DIAGNOSIS — N12 Tubulo-interstitial nephritis, not specified as acute or chronic: Secondary | ICD-10-CM | POA: Diagnosis not present

## 2023-06-04 DIAGNOSIS — O9963 Diseases of the digestive system complicating the puerperium: Secondary | ICD-10-CM | POA: Diagnosis not present

## 2023-06-04 DIAGNOSIS — O9903 Anemia complicating the puerperium: Secondary | ICD-10-CM | POA: Diagnosis not present

## 2023-06-04 DIAGNOSIS — Z91040 Latex allergy status: Secondary | ICD-10-CM | POA: Diagnosis not present

## 2023-06-04 DIAGNOSIS — Z79899 Other long term (current) drug therapy: Secondary | ICD-10-CM | POA: Diagnosis not present

## 2023-06-04 DIAGNOSIS — O99285 Endocrine, nutritional and metabolic diseases complicating the puerperium: Secondary | ICD-10-CM | POA: Diagnosis not present

## 2023-06-04 HISTORY — DX: Acute pyelonephritis: N10

## 2023-06-04 LAB — COMPREHENSIVE METABOLIC PANEL
ALT: 21 U/L (ref 0–44)
AST: 21 U/L (ref 15–41)
Albumin: 2.8 g/dL — ABNORMAL LOW (ref 3.5–5.0)
Alkaline Phosphatase: 95 U/L (ref 38–126)
Anion gap: 8 (ref 5–15)
BUN: 7 mg/dL (ref 6–20)
CO2: 22 mmol/L (ref 22–32)
Calcium: 8.3 mg/dL — ABNORMAL LOW (ref 8.9–10.3)
Chloride: 107 mmol/L (ref 98–111)
Creatinine, Ser: 0.71 mg/dL (ref 0.44–1.00)
GFR, Estimated: >60 mL/min (ref >60)
Glucose, Bld: 115 mg/dL — ABNORMAL HIGH (ref 70–99)
Potassium: 4.1 mmol/L (ref 3.5–5.1)
Sodium: 137 mmol/L (ref 135–145)
Total Bilirubin: 1.2 mg/dL (ref 0.0–1.2)
Total Protein: 6.7 g/dL (ref 6.5–8.1)

## 2023-06-04 LAB — CBC WITH DIFFERENTIAL/PLATELET
Abs Immature Granulocytes: 0.16 10*3/uL — ABNORMAL HIGH (ref 0.00–0.07)
Basophils Absolute: 0 10*3/uL (ref 0.0–0.1)
Basophils Relative: 0 %
Eosinophils Absolute: 0 10*3/uL (ref 0.0–0.5)
Eosinophils Relative: 0 %
HCT: 29.9 % — ABNORMAL LOW (ref 36.0–46.0)
Hemoglobin: 9.2 g/dL — ABNORMAL LOW (ref 12.0–15.0)
Immature Granulocytes: 1 %
Lymphocytes Relative: 9 %
Lymphs Abs: 1.6 10*3/uL (ref 0.7–4.0)
MCH: 25.1 pg — ABNORMAL LOW (ref 26.0–34.0)
MCHC: 30.8 g/dL (ref 30.0–36.0)
MCV: 81.7 fL (ref 80.0–100.0)
Monocytes Absolute: 1.4 10*3/uL — ABNORMAL HIGH (ref 0.1–1.0)
Monocytes Relative: 8 %
Neutro Abs: 14.2 10*3/uL — ABNORMAL HIGH (ref 1.7–7.7)
Neutrophils Relative %: 82 %
Platelets: 298 10*3/uL (ref 150–400)
RBC: 3.66 MIL/uL — ABNORMAL LOW (ref 3.87–5.11)
RDW: 15.5 % (ref 11.5–15.5)
WBC: 17.4 10*3/uL — ABNORMAL HIGH (ref 4.0–10.5)
nRBC: 0 % (ref 0.0–0.2)

## 2023-06-04 LAB — MAGNESIUM: Magnesium: 2.2 mg/dL (ref 1.7–2.4)

## 2023-06-04 LAB — PROCALCITONIN: Procalcitonin: 1.29 ng/mL

## 2023-06-04 MED ORDER — LEVALBUTEROL HCL 1.25 MG/0.5ML IN NEBU: 1.2500 mg | INHALATION_SOLUTION | RESPIRATORY_TRACT | Status: DC | PRN

## 2023-06-04 MED ORDER — ONDANSETRON HCL 4 MG/2ML IJ SOLN
4.0000 mg | Freq: Four times a day (QID) | INTRAMUSCULAR | Status: DC | PRN
  Administered 2023-06-04: 4 mg via INTRAVENOUS
  Filled 2023-06-04: qty 2

## 2023-06-04 MED ORDER — ACETAMINOPHEN 650 MG RE SUPP: 650.0000 mg | Freq: Four times a day (QID) | RECTAL | Status: DC | PRN

## 2023-06-04 MED ORDER — ACETAMINOPHEN 500 MG PO TABS
1000.0000 mg | ORAL_TABLET | Freq: Once | ORAL | Status: AC
  Administered 2023-06-04: 1000 mg via ORAL
  Filled 2023-06-04: qty 2

## 2023-06-04 MED ORDER — POTASSIUM CHLORIDE CRYS ER 20 MEQ PO TBCR
40.0000 meq | EXTENDED_RELEASE_TABLET | Freq: Once | ORAL | Status: AC
  Administered 2023-06-04: 40 meq via ORAL
  Filled 2023-06-04: qty 2

## 2023-06-04 MED ORDER — MELATONIN 3 MG PO TABS
3.0000 mg | ORAL_TABLET | Freq: Every evening | ORAL | Status: DC | PRN
  Administered 2023-06-04: 3 mg via ORAL
  Filled 2023-06-04: qty 1

## 2023-06-04 MED ORDER — LACTATED RINGERS IV SOLN: INTRAVENOUS | Status: AC

## 2023-06-04 MED ORDER — SODIUM CHLORIDE 0.9 % IV SOLN
1.0000 g | INTRAVENOUS | Status: DC
  Administered 2023-06-04: 1 g via INTRAVENOUS
  Filled 2023-06-04: qty 10

## 2023-06-04 MED ORDER — SODIUM CHLORIDE 0.9 % IV BOLUS
1000.0000 mL | Freq: Once | INTRAVENOUS | Status: AC
  Administered 2023-06-04: 1000 mL via INTRAVENOUS

## 2023-06-04 MED ORDER — BUSPIRONE HCL 5 MG PO TABS
5.0000 mg | ORAL_TABLET | Freq: Three times a day (TID) | ORAL | Status: DC
  Administered 2023-06-04 – 2023-06-06 (×7): 5 mg via ORAL
  Filled 2023-06-04 (×7): qty 1

## 2023-06-04 MED ORDER — SODIUM CHLORIDE 0.9 % IV SOLN: 1.0000 g | INTRAVENOUS | Status: DC

## 2023-06-04 MED ORDER — SERTRALINE HCL 100 MG PO TABS
100.0000 mg | ORAL_TABLET | Freq: Every day | ORAL | Status: DC
Start: 2023-06-04 — End: 2023-06-06
  Administered 2023-06-04 – 2023-06-06 (×3): 100 mg via ORAL
  Filled 2023-06-04: qty 1
  Filled 2023-06-04: qty 2
  Filled 2023-06-04: qty 1

## 2023-06-04 MED ORDER — SODIUM CHLORIDE 0.9 % IV SOLN
2.0000 g | INTRAVENOUS | Status: DC
  Administered 2023-06-05 – 2023-06-06 (×2): 2 g via INTRAVENOUS
  Filled 2023-06-04 (×2): qty 20

## 2023-06-04 MED ORDER — NALOXONE HCL 0.4 MG/ML IJ SOLN: 0.4000 mg | INTRAMUSCULAR | Status: DC | PRN

## 2023-06-04 MED ORDER — FENTANYL CITRATE PF 50 MCG/ML IJ SOSY
25.0000 ug | PREFILLED_SYRINGE | INTRAMUSCULAR | Status: DC | PRN
  Administered 2023-06-04 – 2023-06-06 (×2): 25 ug via INTRAVENOUS
  Filled 2023-06-04 (×2): qty 1

## 2023-06-04 MED ORDER — ACETAMINOPHEN 325 MG PO TABS
650.0000 mg | ORAL_TABLET | Freq: Four times a day (QID) | ORAL | Status: DC | PRN
  Administered 2023-06-04 – 2023-06-05 (×5): 650 mg via ORAL
  Filled 2023-06-04 (×5): qty 2

## 2023-06-04 NOTE — H&P (Signed)
 History and Physical      Joan Kim ZOX:[id number] DOB: 2001/03/21 DOA: 06/03/2023; DOS: 06/04/2023  PCP: Program, [facility]  Patient coming from: home   I have personally briefly reviewed patient's old medical records in [facility]  Chief Complaint: Fever  HPI: Joan Kim is a 23 y.o. female with medical history significant for mild intermittent asthma, GAD, who is admitted to [facility] on 06/03/2023 with sepsis due to right-sided pyelonephritis after presenting from home to [facility] ED complaining of fever.   The patient is 2 weeks postpartum, having delivered via C-section on 05/20/2023.   She reports 2 days of subjective fever associated with chills in the absence of full body rigors or generalized myalgias.  She notes associated suprapubic discomfort, sharp, nonradiating in nature, and associated with new onset dysuria.  Denies any associated neck stiffness, cough, diarrhea, rash.  No drainage from the C-section surgical site.  Not associated with any chest pain or hemoptysis.  Per chart review, she has a history of prior urinary tract infections.    ED Course:  Vital signs in the ED were notable for the following: Temperature max 102.9; initial heart rates in the 140s, subsequently decreasing into the 110s to 120s following IV fluids and initiation of IV antibiotics; systolic blood pressures in the 120s to 130s; respiratory rate 25-28, oxygen saturation 98 to 100% on room air.  Labs were notable for the following: CMP was notable for the following: Potassium 3.3, creatinine 0.73, liver enzymes are within normal limits.  Lactic acid 1.3.  CBC notable for Locasol count 20,300 with 84% neutrophils, hemoglobin 11.1 compared to 8.0 on 05/19/2023.  INR 1.1, PTT 36.  Urinalysis notable for greater than 50 white blood cells, large leukocyte esterase, no evidence of squamous epithelial cells.  Blood cultures x 2 and urine culture were collected  prior to initiation of IV antibiotics.  COVID, influenza, RSV PCR were all negative.  Per my interpretation, EKG in ED demonstrated the following: Sinus tachycardia with heart rate 141, normal intervals, no evidence of T wave changes, nonspecific less than 1 mm ST depression in V3, and no evidence of ST elevation.  Imaging in the ED, per corresponding formal radiology read, was notable for the following: 1 view chest x-ray showed no evidence of acute cardiopulmonary process, including no evidence of infiltrate, edema, effusion, or pneumothorax.  CT abdomen/pelvis with contrast showed moderate to severe right-sided pyelonephritis as well as evidence of cystitis, in the absence of any evidence of ureteral stone evidence of hydronephrosis.  Otherwise, CT abdomen/will showed no evidence of acute intra-abdominal or acute intrapelvic process.  While in the ED, the following were administered: Acetaminophen 1 g p.o. x 1 dose, cefepime, IV Flagyl, lactated Ringer's x 1 L bolus, normal saline x 1 L bolus.  Subsequently, the patient was admitted for further evaluation management of sepsis due to right-sided pyelonephritis, with presenting labs also notable for hypokalemia.    Review of Systems: As per HPI otherwise 10 point review of systems negative.   Past Medical History:  Diagnosis Date   Asthma    Bacterial vaginitis    Dyspepsia 08/30/2019   Fatigue 08/12/2020   GERD (gastroesophageal reflux disease)    H/O bladder infections    Helicobacter pylori antibody positive 03/2019   Insomnia 09/18/2020   Migraine without aura and without status migrainosus, not intractable 04/23/2020   Nausea/vomiting in pregnancy 10/22/2022    Past Surgical History:  Procedure Laterality  Date   CESAREAN SECTION N/A 05/20/2023   Procedure: CESAREAN SECTION;  Surgeon: Kathrynn Running, MD;  Location: [facility] LD ORS;  Service: Obstetrics;  Laterality: N/A;   NO PAST SURGERIES      Social History:  reports that she  has never smoked. She has been exposed to tobacco smoke. She has never used smokeless tobacco. She reports that she does not currently use alcohol. She reports that she does not use drugs.   Allergies  Allergen Reactions   Latex Rash    Family History  Problem Relation Age of Onset   Emphysema Maternal Uncle    Hypertension Maternal Grandmother    Diabetes Paternal Grandmother    Hypertension Paternal Grandfather    Heart disease Paternal Grandfather    Bladder Cancer Paternal Grandfather    Irritable bowel syndrome Other    Colon cancer Neg Hx    Esophageal cancer Neg Hx    Rectal cancer Neg Hx    Stomach cancer Neg Hx     Family history reviewed and not pertinent .    Prior to Admission medications   Medication Sig Start Date End Date Taking? Authorizing Provider  Accu-Chek Softclix Lancets lancets 1 each by Other route 4 (four) times daily. 03/11/23   Constant, Peggy, MD  acetaminophen (TYLENOL) 325 MG tablet Take 650 mg by mouth every 6 (six) hours as needed.    [provider]  albuterol (VENTOLIN HFA) 108 (90 Base) MCG/ACT inhaler INHALE 2 PUFFS INTO THE LUNGS EVERY 4 HOURS AS NEEDED FOR WHEEZING OR SHORTNESS OF BREATH. 12/13/22   Reva Bores, MD  Blood Glucose Monitoring Suppl (ACCU-CHEK GUIDE) w/Device KIT 1 Device by Does not apply route 4 (four) times daily. 03/11/23   Constant, Peggy, MD  busPIRone (BUSPAR) 5 MG tablet Take 5 mg by mouth 3 (three) times daily.    [provider]  cyclobenzaprine (FLEXERIL) 5 MG tablet Take 1 tablet (5 mg total) by mouth every 8 (eight) hours as needed. Patient not taking: Reported on 05/13/2023 04/05/23   Aviva Signs, CNM  famotidine (PEPCID) 20 MG tablet Take 1 tablet (20 mg total) by mouth 2 (two) times daily. Patient not taking: Reported on 05/05/2023 01/13/23   Federico Flake, MD  Ferric Maltol 30 MG CAPS Take 1 capsule (30 mg total) by mouth 2 (two) times daily. Please take one hour before breakfast  and dinner 04/11/23   Anyanwu, Jethro Bastos, MD  ferrous gluconate (FERGON) 324 MG tablet Take 1 tablet (324 mg total) by mouth every other day. 05/23/23   Hessie Dibble, MD  furosemide (LASIX) 20 MG tablet Take 1 tablet (20 mg total) by mouth daily. 05/23/23   Hessie Dibble, MD  glucose blood test strip Use as instructed 03/11/23   Constant, Peggy, MD  ibuprofen (ADVIL) 600 MG tablet Take 1 tablet (600 mg total) by mouth every 6 (six) hours. 05/23/23   Hessie Dibble, MD  oxyCODONE (OXY IR/ROXICODONE) 5 MG immediate release tablet Take 1 tablet (5 mg total) by mouth every 4 (four) hours as needed for moderate pain (pain score 4-6). 05/23/23   Hessie Dibble, MD  potassium chloride SA (KLOR-CON M) 20 MEQ tablet Take 1 tablet (20 mEq total) by mouth daily. 05/23/23   Hessie Dibble, MD  Prenatal Multivit-Min-Fe-FA (PRENATAL/IRON) TABS Take 1 tablet by mouth daily. 01/28/20   Meccariello, Solmon Ice, MD  promethazine (PHENERGAN) 25 MG tablet Take 25 mg by mouth every 6 (  six) hours as needed for nausea or vomiting.    [provider]  senna-docusate (SENOKOT-S) 8.6-50 MG tablet Take 2 tablets by mouth 2 (two) times daily as needed for mild constipation. 05/23/23   Hessie Dibble, MD  sertraline (ZOLOFT) 100 MG tablet Take 1 tablet (100 mg total) by mouth daily. 10/08/22   Federico Flake, MD     Objective    Physical Exam: Vitals:   06/03/23 2137 06/03/23 2300 06/04/23 0055 06/04/23 0200  BP: 129/77 133/80 113/71 120/86  Pulse: (!) 123 (!) 110 (!) 130 (!) 120  Resp: (!) 30 (!) 25 (!) 28 (!) 28  Temp: 100.3 F (37.9 C)  (!) 102.9 F (39.4 C)   TempSrc: Oral  Oral   SpO2: 100% 99% 99% 98%  Weight:      Height:        General: appears to be stated age; alert, oriented Skin: warm, dry, no rash Head:  AT/ Mouth:  Oral mucosa membranes appear dry, normal dentition Neck: supple; trachea midline Heart: Tachycardic, but regular; did not appreciate any M/R/G Lungs: CTAB, did not  appreciate any wheezes, rales, or rhonchi Abdomen: + BS; soft, ND, NT Vascular: 2+ pedal pulses b/l; 2+ radial pulses b/l Extremities: no peripheral edema, no muscle wasting Neuro: strength and sensation intact in upper and lower extremities b/l    Labs on Admission: I have personally reviewed following labs and imaging studies  CBC: Recent Labs  Lab 06/03/23 2021  WBC 20.3*  NEUTROABS 17.0*  HGB 11.1*  HCT 35.5*  MCV 80.3  PLT 445*   Basic Metabolic Panel: Recent Labs  Lab 06/03/23 2021  NA 133*  K 3.3*  CL 102  CO2 19*  GLUCOSE 117*  BUN 9  CREATININE 0.73  CALCIUM 8.7*   GFR: Estimated Creatinine Clearance: 116.9 mL/min (by C-G formula based on SCr of 0.73 mg/dL). Liver Function Tests: Recent Labs  Lab 06/03/23 2021  AST 28  ALT 27  ALKPHOS 113  BILITOT 0.9  PROT 8.2*  ALBUMIN 3.5   No results for input(s): "LIPASE", "AMYLASE" in the last 168 hours. No results for input(s): "AMMONIA" in the last 168 hours. Coagulation Profile: Recent Labs  Lab 06/03/23 2021  INR 1.1   Cardiac Enzymes: No results for input(s): "CKTOTAL", "CKMB", "CKMBINDEX", "TROPONINI" in the last 168 hours. BNP (last 3 results) No results for input(s): "PROBNP" in the last 8760 hours. HbA1C: No results for input(s): "HGBA1C" in the last 72 hours. CBG: No results for input(s): "GLUCAP" in the last 168 hours. Lipid Profile: No results for input(s): "CHOL", "HDL", "LDLCALC", "TRIG", "CHOLHDL", "LDLDIRECT" in the last 72 hours. Thyroid Function Tests: No results for input(s): "TSH", "T4TOTAL", "FREET4", "T3FREE", "THYROIDAB" in the last 72 hours. Anemia Panel: No results for input(s): "VITAMINB12", "FOLATE", "FERRITIN", "TIBC", "IRON", "RETICCTPCT" in the last 72 hours. Urine analysis:    Component Value Date/Time   COLORURINE YELLOW 06/03/2023 2044   APPEARANCEUR TURBID (A) 06/03/2023 2044   LABSPEC 1.015 06/03/2023 2044   PHURINE 6.0 06/03/2023 2044   GLUCOSEU NEGATIVE  06/03/2023 2044   HGBUR MODERATE (A) 06/03/2023 2044   BILIRUBINUR NEGATIVE 06/03/2023 2044   KETONESUR NEGATIVE 06/03/2023 2044   PROTEINUR 100 (A) 06/03/2023 2044   UROBILINOGEN 1.0 09/14/2013 2310   NITRITE NEGATIVE 06/03/2023 2044   LEUKOCYTESUR LARGE (A) 06/03/2023 2044    Radiological Exams on Admission: CT ABDOMEN PELVIS W CONTRAST Result Date: 06/03/2023 CLINICAL DATA:  Fever, back pain, chills and shortness of  breath. EXAM: CT ABDOMEN AND PELVIS WITH CONTRAST TECHNIQUE: Multidetector CT imaging of the abdomen and pelvis was performed using the standard protocol following bolus administration of intravenous contrast. RADIATION DOSE REDUCTION: This exam was performed according to the departmental dose-optimization program which includes automated exposure control, adjustment of the mA and/or kV according to patient size and/or use of iterative reconstruction technique. CONTRAST:  OMNIPAQUE IOHEXOL 300 MG/ML  SOLN COMPARISON:  December 28, 2018 FINDINGS: Lower chest: No acute abnormality. Hepatobiliary: No focal liver abnormality is seen. No gallstones, gallbladder wall thickening, or biliary dilatation. Pancreas: Unremarkable. No pancreatic ductal dilatation or surrounding inflammatory changes. Spleen: Normal in size without focal abnormality. Adrenals/Urinary Tract: Adrenal glands are unremarkable. Kidneys are normal in size, without renal calculi or hydronephrosis. Moderate to marked severity right-sided hydroureter is seen involving the proximal and mid portions of the right ureter. Very mild right-sided periureteral inflammatory fat stranding is noted. An ill-defined, approximately 8 mm area of heterogeneous low attenuation is seen within the mid right kidney. This represents a new finding. The urinary bladder is poorly distended. Moderate severity inflammatory fat stranding is seen surrounding the urinary bladder. Stomach/Bowel: Stomach is within normal limits. The appendix is not clearly  identified. No evidence of bowel wall thickening, distention, or inflammatory changes. Vascular/Lymphatic: No significant vascular findings are present. No enlarged abdominal or pelvic lymph nodes. Reproductive: Uterus and bilateral adnexa are unremarkable. Other: No abdominal wall hernia or abnormality. No abdominopelvic ascites. Musculoskeletal: No acute or significant osseous findings. IMPRESSION: Findings consistent with moderate to marked severity right-sided pyelonephritis and cystitis. Electronically Signed   By: Aram Candela M.D.   On: 06/03/2023 23:55   DG Chest Port 1 View Result Date: 06/03/2023 CLINICAL DATA:  Fever EXAM: PORTABLE CHEST 1 VIEW COMPARISON:  None Available. FINDINGS: The heart size and mediastinal contours are within normal limits. Both lungs are clear. The visualized skeletal structures are unremarkable. IMPRESSION: No active disease. Electronically Signed   By: Annia Belt M.D.   On: 06/03/2023 23:03      Assessment/Plan    Principal Problem:   Acute pyelonephritis Active Problems:   Mild intermittent asthma   Sepsis (HCC)   Fever   Leukocytosis   Hypokalemia   GAD (generalized anxiety disorder)   Chronic anemia     #) Sepsis due to acute right-sided pyelonephritis: Diagnosis on the basis of 2 days of subjective fever associated with chills, with presenting objective fever noted, and associated with suprapubic tenderness as well as new onset dysuria, with presenting urinalysis suggestive of UTI in the setting of significant pyuria, large leukocyte esterase, and no evidence of squamous epithelial cells to suggest a contaminated specimen, while CT abdomen/pelvis showed evidence of right-sided pyelonephritis as well as bladder wall thickening consistent with acute cystitis.  No evidence of ureteral stone or postrenal obstruction on imaging.  CT abdomen/pelvis otherwise demonstrate no evidence of acute process, including no evidence of retained products of  pregnancy, nor any evidence of subcutaneous edema as a relates to her postsurgical C-section site.   SIRS criteria met via objective fever, white blood cell count of greater than 20,000 with neutrophilic predominance, tachycardia, tachypnea. Of note, in the absence of any evidence of end organ damage, including a non-elevated presenting LA of 1.3, pt's sepsis does not meet criteria to be considered severe in nature. Also, in the absence of LA level greater than or equal to 4.0, and in the absence of any associated hypotension refractory to IVF's, there are no indications  for administration of a 30 mL/kg IVF bolus at this time.   Additional ED work-up/management notable for: Collection of blood cultures x 2 as well as urine culture followed by initiation of cefepime and IV Flagyl.  Can likely de-escalate these IV antibiotics to Rocephin elevate urinary source of infection has been identified.  No e/o additional infectious process at this time, including chest x-ray that showed no evidence of acute cardiopulmonary process, including no evidence of infiltrate to suggest pneumonia, while COVID, influenza, RSV PCR were all negative.   Plan: CBC w/ diff and CMP in AM. Follow for results of blood cx's x 2 as well as urine culture. Abx: Start Rocephin, as above.  Lactated Ringer's at 125 cc/h x 1 day.  Prn acetaminophen for fever.  Monitor on telemetry.  As needed IV fentanyl for suprapubic discomfort.  Add on procalcitonin level. of note, the sepsis order set was utilized in placement of admission orders for this patient.                    #) Hypokalemia: presenting potassium level noted to be 3.3.    Plan: monitor on tele. KCl 40 meq p.o. x 1 dose now. Add-on serum mag level. CMP, mag level in the AM.                      #) Mild intermittent asthma: documented history thereof, without clinical e/o to suggest current exacerbation. Outpatient respiratory regimen includes  prn albuterol inhaler.  Of note, in the setting of persistent tachycardia, will pursue Xopenex as preferred beta-2 agonist.  Plan: Check serum magnesium level.  Prn Xopenex nebulizer, as above.                     #) Generalized anxiety disorder: documented h/o such. On scheduled Zoloft and BuSpar as outpatient.  No evidence of QTc prolongation on presenting EKG.  Plan: Resume home Zoloft and BuSpar.                   #) Chronic anemia: Documented history of such, a/w with baseline hgb range 8-11 since December 2024, with presenting hgb consistent with this range, increased relative demonstration prior hemoglobin data point drawn on 05/21/2023, as further quantified above , in the absence of any overt evidence of active bleed, which is notable in the context of her recent C-section, including no evidence of hematoma on today CT abdomen/pelvis with IV contrast.  INR, PTT within normal limits.   Plan: Repeat CBC in the morning.     DVT prophylaxis: SCD's   Code Status: Full code Family Communication: none Disposition Plan: Per Rounding Team Consults called: none;  Admission status: Inpatient    I SPENT GREATER THAN 75  MINUTES IN CLINICAL CARE TIME/MEDICAL DECISION-MAKING IN COMPLETING THIS ADMISSION.     Chaney Born Jacquese Hackman DO [facility] From 7PM - 7AM   06/04/2023, 2:36 AM

## 2023-06-04 NOTE — ED Provider Notes (Signed)
  Physical Exam  BP 120/86   Pulse (!) 120   Temp (!) 102.9 F (39.4 C) (Oral)   Resp (!) 28   Ht 5\' 6"  (1.676 m)   Wt 80.3 kg   LMP 08/19/2022   SpO2 98%   BMI 28.57 kg/m   Physical Exam Vitals and nursing note reviewed.  HENT:     Head: Normocephalic and atraumatic.  Eyes:     Pupils: Pupils are equal, round, and reactive to light.  Cardiovascular:     Rate and Rhythm: Normal rate and regular rhythm.  Pulmonary:     Effort: Pulmonary effort is normal.     Breath sounds: Normal breath sounds.  Abdominal:     Palpations: Abdomen is soft.     Tenderness: There is no abdominal tenderness.  Skin:    General: Skin is warm and dry.  Neurological:     Mental Status: She is alert.  Psychiatric:        Mood and Affect: Mood normal.     Procedures  Procedures  ED Course / MDM   Clinical Course as of 06/04/23 0240  Sat Jun 04, 2023  0239 CT abdomen pelvis reveals cystitis with right pyelonephritis.  Patient afebrile after acetaminophen here.  Will provide second liter of IV fluids for ongoing tachycardia.  Hemodynamically stable at this time.  Discussed with admitting hospitalist who accept patient for inpatient admission for further treatment of pyelonephritis [MP]    Clinical Course User Index [MP] Royanne Foots, DO   Medical Decision Making I, Estelle June DO, have assumed care of this patient from the previous provider pending CT abdomen pelvis, reevaluation and admission  Amount and/or Complexity of Data Reviewed Labs: ordered. Radiology: ordered.  Risk OTC drugs. Prescription drug management. Decision regarding hospitalization.          Royanne Foots, DO 06/04/23 [phone]

## 2023-06-04 NOTE — Progress Notes (Signed)
 Brief same day note:  Patient is a 23 year old female with history of mild intermittent asthma, generalized anxiety disorder who presented with fever, chills, dysuria, suprapubic discomfort.  On presentation, she was febrile, tachycardic.  Lab work showed potassium 3.3, leukocytosis.  UA suspicious for UTI.  Chest x-ray did not show any acute findings.  CT abdomen/pelvis showed moderate to severe right-sided pyelonephritis, cystitis, no evidence of ureteral stone or hydronephrosis.  Patient admitted for the management of sepsis secondary to pyelonephritis, started on IV antibiotics, IV fluids.  Patient seen and examined at bedside today.  During my evaluation, she looks overall comfortable.  Complains of some nausea but feels much better today.  EKG monitor showed sinus tachycardia with stable blood pressure.  Febrile this morning.  Assessment and plan:  Acute pyelonephritis: Presented with fever, chills, suprapubic pain, dysuria.  CT imaging shows moderate to severe right-sided pyonephritis, cystitis in the absence of ureteral stone or hydronephrosis.  Continue current antibiotics.  Leukocytosis improving.  Fever may take time to resolve.  Sepsis secondary to pyelonephritis: Presented with fever.  Still febrile this morning.  Continue IV fluid, antibiotic.  Follow-up cultures.  Mild intermittent asthma: Currently not in exacerbation.  Continue bronchodilators as needed  Hypokalemia: Supplemented and corrected  Normocytic anemia: Currently hemoglobin in the range of 9.  General Anxiety disorder: On Zoloft, BuSpar

## 2023-06-05 DIAGNOSIS — N10 Acute tubulo-interstitial nephritis: Secondary | ICD-10-CM | POA: Diagnosis not present

## 2023-06-05 NOTE — Progress Notes (Signed)
 PROGRESS NOTE  Joan Kim  NWG:[id number] DOB: [birth year] DOA: 06/03/2023 PCP: Program, [facility]   Brief Narrative:  Patient is a 23 year old female with history of mild intermittent asthma, generalized anxiety disorder who presented with fever, chills, dysuria, suprapubic discomfort.  On presentation, she was febrile, tachycardic.  Lab work showed potassium 3.3, leukocytosis.  UA suspicious for UTI.  Chest x-ray did not show any acute findings.  CT abdomen/pelvis showed moderate to severe right-sided pyelonephritis, cystitis, no evidence of ureteral stone or hydronephrosis.  Patient admitted for the management of sepsis secondary to pyelonephritis, started on IV antibiotics, IV fluids.  Assessment & Plan:  Principal Problem:   Acute pyelonephritis Active Problems:   Mild intermittent asthma   Sepsis (HCC)   Fever   Leukocytosis   Hypokalemia   GAD (generalized anxiety disorder)   Chronic anemia   Acute pyelonephritis: Presented with fever, chills, suprapubic pain, dysuria.  CT imaging showed moderate to severe right-sided pyonephritis, cystitis in the absence of ureteral stone or hydronephrosis.  Continue current antibiotics.  Leukocytosis improving.  Afebrile this morning  Sepsis secondary to pyelonephritis: Presented with fever.  Elevated procalcitonin.  Afebrile this morning.  Cultures have remained negative so far.  Will discontinue IV fluids  Mild intermittent asthma: Currently not in exacerbation.  Continue bronchodilators as needed  Hypokalemia: Supplemented and corrected  Normocytic anemia: Currently hemoglobin in the range of 9.  General Anxiety disorder: On Zoloft, BuSpar        DVT prophylaxis:SCDs Start: 06/04/23 0232     Code Status: Full Code  Family Communication: None at the bedside  Patient status: Inpatient  Patient is from : Home  Anticipated discharge to: Home  Estimated DC date:  Tomorrow   Consultants: None  Procedures: None  Antimicrobials:  Anti-infectives (From admission, onward)    Start     Dose/Rate Route Frequency Ordered Stop   06/05/23 0800  cefTRIAXone (ROCEPHIN) 2 g in sodium chloride 0.9 % 100 mL IVPB        2 g 200 mL/hr over 30 Minutes Intravenous Every 24 hours 06/04/23 1126     06/04/23 0800  cefTRIAXone (ROCEPHIN) 1 g in sodium chloride 0.9 % 100 mL IVPB  Status:  Discontinued        1 g 200 mL/hr over 30 Minutes Intravenous Every 24 hours 06/04/23 0255 06/04/23 1126   06/04/23 0245  cefTRIAXone (ROCEPHIN) 1 g in sodium chloride 0.9 % 100 mL IVPB  Status:  Discontinued        1 g 200 mL/hr over 30 Minutes Intravenous Every 24 hours 06/04/23 0233 06/04/23 0255   06/03/23 2045  ceFEPIme (MAXIPIME) 2 g in sodium chloride 0.9 % 100 mL IVPB        2 g 200 mL/hr over 30 Minutes Intravenous  Once 06/03/23 2036 06/03/23 2140   06/03/23 2045  metroNIDAZOLE (FLAGYL) IVPB 500 mg        500 mg 100 mL/hr over 60 Minutes Intravenous  Once 06/03/23 2036 06/03/23 2311       Subjective: Patient seen and examined at bedside today.  Hemodynamically stable.  Feels very comfortable today.  No abdominal pain nausea vomiting.  Afebrile during my evaluation this morning.  We discussed about staying in hospital 1 more night, continue IV antibiotics and watching culture report  Objective: Vitals:   06/04/23 2200 06/04/23 2320 06/05/23 0349 06/05/23 0500  BP: 127/79  113/77   Pulse: (!) 105  91  Resp: 14  14   Temp: (!) 102.8 F (39.3 C) 99.6 F (37.6 C) 98.9 F (37.2 C)   TempSrc: Oral Oral Oral   SpO2: 100%  99%   Weight:    76.1 kg  Height:        Intake/Output Summary (Last 24 hours) at 06/05/2023 1137 Last data filed at 06/05/2023 1000 Gross per 24 hour  Intake 2240 ml  Output --  Net 2240 ml   Filed Weights   06/03/23 2004 06/05/23 0500  Weight: 80.3 kg 76.1 kg    Examination:  General exam: Overall comfortable, not in distress HEENT:  PERRL Respiratory system:  no wheezes or crackles  Cardiovascular system: S1 & S2 heard, RRR.  Gastrointestinal system: Abdomen is nondistended, soft and nontender. Central nervous system: Alert and oriented Extremities: No edema, no clubbing ,no cyanosis Skin: No rashes, no ulcers,no icterus     Data Reviewed: I have personally reviewed following labs and imaging studies  CBC: Recent Labs  Lab 06/03/23 2021 06/04/23 0500  WBC 20.3* 17.4*  NEUTROABS 17.0* 14.2*  HGB 11.1* 9.2*  HCT 35.5* 29.9*  MCV 80.3 81.7  PLT 445* 298   Basic Metabolic Panel: Recent Labs  Lab 06/03/23 2021 06/04/23 0500  NA 133* 137  K 3.3* 4.1  CL 102 107  CO2 19* 22  GLUCOSE 117* 115*  BUN 9 7  CREATININE 0.73 0.71  CALCIUM 8.7* 8.3*  MG  --  2.2     Recent Results (from the past 240 hours)  Culture, blood (Routine x 2)     Status: None (Preliminary result)   Collection Time: 06/03/23  8:21 PM   Specimen: BLOOD  Result Value Ref Range Status   Specimen Description   Final    BLOOD RIGHT ANTECUBITAL Performed at [facility], [address], [place], [place] [address]    Special Requests   Final    BOTTLES DRAWN AEROBIC AND ANAEROBIC Blood Culture adequate volume Performed at [facility], [address] [place], [place] [address]    Culture   Final    NO GROWTH 2 DAYS Performed at [facility], [address] [place], [place] [address]    Report Status PENDING  Incomplete  Resp panel by RT-PCR (RSV, Flu A&B, Covid) Anterior Nasal Swab     Status: None   Collection Time: 06/03/23  8:54 PM   Specimen: Anterior Nasal Swab  Result Value Ref Range Status   SARS Coronavirus 2 by RT PCR NEGATIVE NEGATIVE Final    Comment: (NOTE) SARS-CoV-2 target nucleic acids are NOT DETECTED.  The SARS-CoV-2 RNA is generally detectable in upper respiratory specimens during the acute phase of infection. The lowest concentration of SARS-CoV-2 viral copies  this assay can detect is 138 copies/mL. A negative result does not preclude SARS-Cov-2 infection and should not be used as the sole basis for treatment or other patient management decisions. A negative result may occur with  improper specimen collection/handling, submission of specimen other than nasopharyngeal swab, presence of viral mutation(s) within the areas targeted by this assay, and inadequate number of viral copies(<138 copies/mL). A negative result must be combined with clinical observations, patient history, and epidemiological information. The expected result is Negative.  Fact Sheet for Patients:  BloggerCourse.com  Fact Sheet for Healthcare Providers:  SeriousBroker.it  This test is no t yet approved or cleared by the [place] FDA and  has been authorized for detection and/or diagnosis of  SARS-CoV-2 by FDA under an Emergency Use Authorization (EUA). This EUA will remain  in effect (meaning this test can be used) for the duration of the COVID-19 declaration under Section 564(b)(1) of the Act, 21 U.S.C.section 360bbb-3(b)(1), unless the authorization is terminated  or revoked sooner.       Influenza A by PCR NEGATIVE NEGATIVE Final   Influenza B by PCR NEGATIVE NEGATIVE Final    Comment: (NOTE) The Xpert Xpress SARS-CoV-2/FLU/RSV plus assay is intended as an aid in the diagnosis of influenza from Nasopharyngeal swab specimens and should not be used as a sole basis for treatment. Nasal washings and aspirates are unacceptable for Xpert Xpress SARS-CoV-2/FLU/RSV testing.  Fact Sheet for Patients: BloggerCourse.com  Fact Sheet for Healthcare Providers: SeriousBroker.it  This test is not yet approved or cleared by the [place] FDA and has been authorized for detection and/or diagnosis of SARS-CoV-2 by FDA under an Emergency Use Authorization (EUA). This EUA  will remain in effect (meaning this test can be used) for the duration of the COVID-19 declaration under Section 564(b)(1) of the Act, 21 U.S.C. section 360bbb-3(b)(1), unless the authorization is terminated or revoked.     Resp Syncytial Virus by PCR NEGATIVE NEGATIVE Final    Comment: (NOTE) Fact Sheet for Patients: BloggerCourse.com  Fact Sheet for Healthcare Providers: SeriousBroker.it  This test is not yet approved or cleared by the [place] FDA and has been authorized for detection and/or diagnosis of SARS-CoV-2 by FDA under an Emergency Use Authorization (EUA). This EUA will remain in effect (meaning this test can be used) for the duration of the COVID-19 declaration under Section 564(b)(1) of the Act, 21 U.S.C. section 360bbb-3(b)(1), unless the authorization is terminated or revoked.  Performed at [facility], [address], [place], [place] [address]   Culture, blood (Routine x 2)     Status: None (Preliminary result)   Collection Time: 06/03/23  9:28 PM   Specimen: BLOOD  Result Value Ref Range Status   Specimen Description   Final    BLOOD BLOOD RIGHT ARM Performed at [facility], [address], [place], [place] [address]    Special Requests   Final    BOTTLES DRAWN AEROBIC AND ANAEROBIC Blood Culture results may not be optimal due to an inadequate volume of blood received in culture bottles Performed at [facility], [address], [place], [place] [address]    Culture   Final    NO GROWTH 2 DAYS Performed at [facility], [address], [place], [place] [address]    Report Status PENDING  Incomplete     Radiology Studies: CT ABDOMEN PELVIS W CONTRAST Result Date: 06/03/2023 CLINICAL DATA:  Fever, back pain, chills and shortness of breath. EXAM: CT ABDOMEN AND PELVIS WITH CONTRAST TECHNIQUE: Multidetector CT imaging of the abdomen and  pelvis was performed using the standard protocol following bolus administration of intravenous contrast. RADIATION DOSE REDUCTION: This exam was performed according to the departmental dose-optimization program which includes automated exposure control, adjustment of the mA and/or kV according to patient size and/or use of iterative reconstruction technique. CONTRAST:  OMNIPAQUE IOHEXOL 300 MG/ML  SOLN COMPARISON:  December 28, 2018 FINDINGS: Lower chest: No acute abnormality. Hepatobiliary: No focal liver abnormality is seen. No gallstones, gallbladder wall thickening, or biliary dilatation. Pancreas: Unremarkable. No pancreatic ductal dilatation or surrounding inflammatory changes. Spleen: Normal in size without focal abnormality. Adrenals/Urinary Tract: Adrenal glands are unremarkable. Kidneys are normal  in size, without renal calculi or hydronephrosis. Moderate to marked severity right-sided hydroureter is seen involving the proximal and mid portions of the right ureter. Very mild right-sided periureteral inflammatory fat stranding is noted. An ill-defined, approximately 8 mm area of heterogeneous low attenuation is seen within the mid right kidney. This represents a new finding. The urinary bladder is poorly distended. Moderate severity inflammatory fat stranding is seen surrounding the urinary bladder. Stomach/Bowel: Stomach is within normal limits. The appendix is not clearly identified. No evidence of bowel wall thickening, distention, or inflammatory changes. Vascular/Lymphatic: No significant vascular findings are present. No enlarged abdominal or pelvic lymph nodes. Reproductive: Uterus and bilateral adnexa are unremarkable. Other: No abdominal wall hernia or abnormality. No abdominopelvic ascites. Musculoskeletal: No acute or significant osseous findings. IMPRESSION: Findings consistent with moderate to marked severity right-sided pyelonephritis and cystitis. Electronically Signed   By: Aram Candela M.D.   On: 06/03/2023 23:55   DG Chest Port 1 View Result Date: 06/03/2023 CLINICAL DATA:  Fever EXAM: PORTABLE CHEST 1 VIEW COMPARISON:  None Available. FINDINGS: The heart size and mediastinal contours are within normal limits. Both lungs are clear. The visualized skeletal structures are unremarkable. IMPRESSION: No active disease. Electronically Signed   By: Annia Belt M.D.   On: 06/03/2023 23:03    Scheduled Meds:  busPIRone  5 mg Oral TID   sertraline  100 mg Oral Daily   Continuous Infusions:  cefTRIAXone (ROCEPHIN)  IV 2 g (06/05/23 0909)     LOS: 1 day   Burnadette Pop, MD [facility] P3/11/2023, 11:37 AM

## 2023-06-06 ENCOUNTER — Other Ambulatory Visit (HOSPITAL_COMMUNITY): Payer: Self-pay

## 2023-06-06 DIAGNOSIS — N10 Acute tubulo-interstitial nephritis: Secondary | ICD-10-CM | POA: Diagnosis not present

## 2023-06-06 LAB — CBC
HCT: 28.8 % — ABNORMAL LOW (ref 36.0–46.0)
Hemoglobin: 8.5 g/dL — ABNORMAL LOW (ref 12.0–15.0)
MCH: 24.7 pg — ABNORMAL LOW (ref 26.0–34.0)
MCHC: 29.5 g/dL — ABNORMAL LOW (ref 30.0–36.0)
MCV: 83.7 fL (ref 80.0–100.0)
Platelets: 303 10*3/uL (ref 150–400)
RBC: 3.44 MIL/uL — ABNORMAL LOW (ref 3.87–5.11)
RDW: 15.3 % (ref 11.5–15.5)
WBC: 8.4 10*3/uL (ref 4.0–10.5)
nRBC: 0 % (ref 0.0–0.2)

## 2023-06-06 LAB — URINE CULTURE: Culture: >=100000 — AB

## 2023-06-06 MED ORDER — CEPHALEXIN 500 MG PO CAPS
500.0000 mg | ORAL_CAPSULE | Freq: Three times a day (TID) | ORAL | 0 refills | Status: AC
  Filled 2023-06-06: qty 12, 4d supply, fill #0

## 2023-06-06 MED ORDER — CEPHALEXIN 500 MG PO CAPS: 500.0000 mg | ORAL_CAPSULE | Freq: Three times a day (TID) | ORAL | Status: DC

## 2023-06-06 NOTE — Progress Notes (Signed)
   06/06/23 0903  TOC Brief Assessment  Insurance and Status Reviewed  Patient has primary care physician Yes  Home environment has been reviewed Resides in single family home with spouse  Prior level of function: Independent with ADLs at baseline  Prior/Current Home Services No current home services  Social Drivers of Health Review SDOH reviewed no interventions necessary  Readmission risk has been reviewed Yes  Transition of care needs no transition of care needs at this time

## 2023-06-06 NOTE — Discharge Summary (Signed)
 Physician Discharge Summary  Joan Kim:[id number] DOB: Jun 13, 2000 DOA: 06/03/2023  PCP: Program, [facility]  Admit date: 06/03/2023 Discharge date: 06/06/2023  Admitted From: Home Disposition:  Home  Discharge Condition:Stable CODE STATUS:FULL, Diet recommendation: regular  Brief/Interim Summary: Patient is a 23 year old female with history of mild intermittent asthma, generalized anxiety disorder who presented with fever, chills, dysuria, suprapubic discomfort. On presentation, she was febrile, tachycardic. Lab work showed potassium 3.3, leukocytosis. UA suspicious for UTI. Chest x-ray did not show any acute findings. CT abdomen/pelvis showed moderate to severe right-sided pyelonephritis, cystitis, no evidence of ureteral stone or hydronephrosis. Patient admitted for the management of sepsis secondary to pyelonephritis, started on IV antibiotics, IV fluids.  Sepsis feels has resolved.  She has clinically improved significantly.  Denies any abdomen pain, nausea or vomiting.  Afebrile.  Urine culture showed pansensitive E. coli.  Medically stable for discharge home with oral antibiotics  Following problems were addressed during the hospitalization:  Acute pyelonephritis: Presented with fever, chills, suprapubic pain, dysuria.  CT imaging showed moderate to severe right-sided pyonephritis, cystitis in the absence of ureteral stone or hydronephrosis.  Continue oral antibiotics on discharge   Sepsis secondary to pyelonephritis: Presented with fever.  Elevated procalcitonin.  Afebrile this morning.  Urine culture showed E. coli.  Blood cultures have remained negative so far  Mild intermittent asthma: Currently not in exacerbation.  Continue bronchodilators as needed   Hypokalemia: Supplemented and corrected   Normocytic anemia: Currently hemoglobin in the range of 9.   General Anxiety disorder: On Zoloft, BuSpar   Discharge Diagnoses:  Principal Problem:    Acute pyelonephritis Active Problems:   Mild intermittent asthma   Sepsis (HCC)   Fever   Leukocytosis   Hypokalemia   GAD (generalized anxiety disorder)   Chronic anemia    Discharge Instructions  Discharge Instructions     Diet general   Complete by: As directed    Discharge instructions   Complete by: As directed    1)Please take prescribed medications as instructed 2)Follow up with your PCP in a week   Increase activity slowly   Complete by: As directed       Allergies as of 06/06/2023       Reactions   Latex Rash        Medication List     STOP taking these medications    Accu-Chek Guide w/Device Kit   Accu-Chek Softclix Lancets lancets   Ferric Maltol 30 MG Caps   ferrous gluconate 324 MG tablet Commonly known as: FERGON   furosemide 20 MG tablet Commonly known as: LASIX   levothyroxine 25 MCG tablet Commonly known as: SYNTHROID   potassium chloride SA 20 MEQ tablet Commonly known as: KLOR-CON M   Prenatal/Iron Tabs   promethazine 12.5 MG tablet Commonly known as: PHENERGAN   promethazine 25 MG tablet Commonly known as: PHENERGAN       TAKE these medications    acetaminophen 325 MG tablet Commonly known as: TYLENOL Take 650 mg by mouth every 6 (six) hours as needed.   busPIRone 5 MG tablet Commonly known as: BUSPAR Take 5 mg by mouth 3 (three) times daily.   cephALEXin 500 MG capsule Commonly known as: KEFLEX Take 1 capsule (500 mg total) by mouth every 8 (eight) hours for 4 days. Start taking on: June 07, 2023   cyclobenzaprine 5 MG tablet Commonly known as: FLEXERIL Take 1 tablet (5 mg total) by mouth every 8 (eight) hours  as needed.   famotidine 20 MG tablet Commonly known as: PEPCID Take 1 tablet (20 mg total) by mouth 2 (two) times daily.   glucose blood test strip Use as instructed   ibuprofen 600 MG tablet Commonly known as: ADVIL Take 1 tablet (600 mg total) by mouth every 6 (six) hours.   oxyCODONE 5 MG  immediate release tablet Commonly known as: Oxy IR/ROXICODONE Take 1 tablet (5 mg total) by mouth every 4 (four) hours as needed for moderate pain (pain score 4-6).   Senna-S 8.6-50 MG tablet Generic drug: senna-docusate Take 2 tablets by mouth 2 (two) times daily as needed for mild constipation.   sertraline 100 MG tablet Commonly known as: ZOLOFT Take 1 tablet (100 mg total) by mouth daily.   Ventolin HFA 108 (90 Base) MCG/ACT inhaler Generic drug: albuterol INHALE 2 PUFFS INTO THE LUNGS EVERY 4 HOURS AS NEEDED FOR WHEEZING OR SHORTNESS OF BREATH.        Follow-up Information     Program, [facility]. Schedule an appointment as soon as possible for a visit in 1 week(s).   Contact information: [address] [place] [address] [phone]                Allergies  Allergen Reactions   Latex Rash    Consultations: None   Procedures/Studies: CT ABDOMEN PELVIS W CONTRAST Result Date: 06/03/2023 CLINICAL DATA:  Fever, back pain, chills and shortness of breath. EXAM: CT ABDOMEN AND PELVIS WITH CONTRAST TECHNIQUE: Multidetector CT imaging of the abdomen and pelvis was performed using the standard protocol following bolus administration of intravenous contrast. RADIATION DOSE REDUCTION: This exam was performed according to the departmental dose-optimization program which includes automated exposure control, adjustment of the mA and/or kV according to patient size and/or use of iterative reconstruction technique. CONTRAST:  OMNIPAQUE IOHEXOL 300 MG/ML  SOLN COMPARISON:  December 28, 2018 FINDINGS: Lower chest: No acute abnormality. Hepatobiliary: No focal liver abnormality is seen. No gallstones, gallbladder wall thickening, or biliary dilatation. Pancreas: Unremarkable. No pancreatic ductal dilatation or surrounding inflammatory changes. Spleen: Normal in size without focal abnormality. Adrenals/Urinary Tract: Adrenal glands are  unremarkable. Kidneys are normal in size, without renal calculi or hydronephrosis. Moderate to marked severity right-sided hydroureter is seen involving the proximal and mid portions of the right ureter. Very mild right-sided periureteral inflammatory fat stranding is noted. An ill-defined, approximately 8 mm area of heterogeneous low attenuation is seen within the mid right kidney. This represents a new finding. The urinary bladder is poorly distended. Moderate severity inflammatory fat stranding is seen surrounding the urinary bladder. Stomach/Bowel: Stomach is within normal limits. The appendix is not clearly identified. No evidence of bowel wall thickening, distention, or inflammatory changes. Vascular/Lymphatic: No significant vascular findings are present. No enlarged abdominal or pelvic lymph nodes. Reproductive: Uterus and bilateral adnexa are unremarkable. Other: No abdominal wall hernia or abnormality. No abdominopelvic ascites. Musculoskeletal: No acute or significant osseous findings. IMPRESSION: Findings consistent with moderate to marked severity right-sided pyelonephritis and cystitis. Electronically Signed   By: Aram Candela M.D.   On: 06/03/2023 23:55   DG Chest Port 1 View Result Date: 06/03/2023 CLINICAL DATA:  Fever EXAM: PORTABLE CHEST 1 VIEW COMPARISON:  None Available. FINDINGS: The heart size and mediastinal contours are within normal limits. Both lungs are clear. The visualized skeletal structures are unremarkable. IMPRESSION: No active disease. Electronically Signed   By: Annia Belt M.D.   On: 06/03/2023 23:03  [place] MFM OB FOLLOW UP Result Date: 05/13/2023 ----------------------------------------------------------------------  OBSTETRICS REPORT                       (Signed Final 05/13/2023 11:20 am) ---------------------------------------------------------------------- Patient Info  ID #:       [id number]                          D.O.B.:  24-Nov-2000 (23 yrs)(F)  Name:       Brand Males               Visit Date: 05/13/2023 10:25 am ---------------------------------------------------------------------- Performed By  Attending:        Noralee Space MD        Ref. Address:     [address]                                                             [address]  Performed By:     Tommie Raymond BS,       [facility]  Referred By:      [facility] ---------------------------------------------------------------------- Orders  #  Description                           Code        Ordered By  1  [place] MFM OB FOLLOW UP                   65784.69    Jaynie Collins ----------------------------------------------------------------------  #  Order #                     Accession #                Episode #  1  [id number]                   [id number]                 [id number] ---------------------------------------------------------------------- Indications  [redacted] weeks gestation of pregnancy  Z3A.37  Hypothyroid (Hashimoto's - Levothyroxine)      O99.280 E03.9  Family history of congenital anomaly (Spina    Z82.79  Bifida)  LR NIPS - Female, Negative Horizon  Encounter for other antenatal screening        Z36.2  follow-up ---------------------------------------------------------------------- Fetal Evaluation  Num Of Fetuses:         1  Fetal Heart Rate(bpm):  155  Cardiac Activity:       Observed  Presentation:           Cephalic  Placenta:               Anterior  P. Cord Insertion:      Previously seen  Amniotic Fluid  AFI FV:      Subjectively upper-normal  AFI Sum(cm)     %Tile       Largest Pocket(cm)  17.18           66          0.4  RUQ(cm)       RLQ(cm)       LUQ(cm)        LLQ(cm)  3.62          3.5           4.62           5.43  ---------------------------------------------------------------------- Biometry  BPD:      98.3  mm     G. Age:  40w 2d       > 99  %    CI:        82.72   %    70 - 86                                                          FL/HC:      20.9   %    20.8 - 22.6  HC:      340.9  mm     G. Age:  39w 2d         73  %    HC/AC:      0.92        0.92 - 1.05  AC:       369   mm     G. Age:  40w 6d       > 99  %    FL/BPD:     72.4   %    71 - 87  FL:       71.2  mm     G. Age:  36w 3d         29  %    FL/AC:      19.3   %    20 - 24  Est. FW:    3876  gm      8 lb 9 oz     98  % ---------------------------------------------------------------------- OB History  Gravidity:    2         Term:   0        Prem:   0        SAB:   1  TOP:          0       Ectopic:  0  Living: 0 ---------------------------------------------------------------------- Gestational Age  LMP:           38w 1d        Date:  08/19/22                  EDD:   05/26/23  U/S Today:     39w 2d                                        EDD:   05/18/23  Best:          37w 2d     Det. By:  Previous Ultrasound      EDD:   06/01/23                                      (10/08/22) ---------------------------------------------------------------------- Anatomy  Cranium:               Appears normal         Diaphragm:              Appears normal  Ventricles:            Appears normal         Stomach:                Appears normal, left                                                                        sided  Thoracic:              Appears normal         Kidneys:                Appear normal  Heart:                 Appears normal         Bladder:                Appears normal                         (4CH, axis, and                         situs)  Other:  Technically difficult due to advanced GA and fetal position. ---------------------------------------------------------------------- Cervix Uterus Adnexa  Cervix  Not visualized (advanced GA >24wks)  Uterus  No  abnormality visualized.  Right Ovary  Within normal limits.  Left Ovary  Within normal limits.  Cul De Sac  No free fluid seen.  Adnexa  No abnormality visualized ---------------------------------------------------------------------- Impression  Gestational diabetes.  Reportedly well-controlled on diet.  Patient reports her fasting levels are between 73 and 86  mg/dL and postprandial levels are between 87 and 134  mg/dL.  Blood pressure today at our office is 170s/71 mmHg.  On today's ultrasound, the estimated fetal weight is at the  98th percentile and the abdominal circumference  measurement is at the 99th percentile.  Amniotic  fluid is  normal good fetal activity seen.  Cephalic presentation.  Ultrasound has limitations in accurately estimating fetal  weights.  If diabetes is well-controlled, she can be delivered at [redacted]  weeks gestation.  If she has abnormal values (fasting or  postprandial, delivery can be considered between 38 and [redacted]  weeks gestation. ---------------------------------------------------------------------- Recommendations  -No follow-up appointments were made . ----------------------------------------------------------------------                 Noralee Space, MD Electronically Signed Final Report   05/13/2023 11:20 am ----------------------------------------------------------------------      Subjective: Patient seen and examined at bedside today.  Hemodynamically stable for discharge.  No fever, abdominal pain, nausea or vomiting.  Very eager to go home  Discharge Exam: Vitals:   06/05/23 2256 06/06/23 0537  BP: 122/84 117/77  Pulse: 68 (!) 57  Resp: 15 15  Temp: 99.6 F (37.6 C) 98.8 F (37.1 C)  SpO2: 100% 100%   Vitals:   06/05/23 1329 06/05/23 2256 06/06/23 0500 06/06/23 0537  BP: 131/79 122/84  117/77  Pulse: 75 68  (!) 57  Resp: 17 15  15   Temp: 97.8 F (36.6 C) 99.6 F (37.6 C)  98.8 F (37.1 C)  TempSrc: Oral Oral  Oral  SpO2: 99% 100%  100%  Weight:   74.9  kg   Height:        General: Pt is alert, awake, not in acute distress Cardiovascular: RRR, S1/S2 +, no rubs, no gallops Respiratory: CTA bilaterally, no wheezing, no rhonchi Abdominal: Soft, NT, ND, bowel sounds + Extremities: no edema, no cyanosis    The results of significant diagnostics from this hospitalization (including imaging, microbiology, ancillary and laboratory) are listed below for reference.     Microbiology: Recent Results (from the past 240 hours)  Culture, blood (Routine x 2)     Status: None (Preliminary result)   Collection Time: 06/03/23  8:21 PM   Specimen: BLOOD  Result Value Ref Range Status   Specimen Description   Final    BLOOD RIGHT ANTECUBITAL Performed at [facility], [address], [place], [place] [address]    Special Requests   Final    BOTTLES DRAWN AEROBIC AND ANAEROBIC Blood Culture adequate volume Performed at [facility], [address] [place], [place] [address]    Culture   Final    NO GROWTH 3 DAYS Performed at [facility], [address], [place], [place] [address]    Report Status PENDING  Incomplete  Urine Culture     Status: Abnormal (Preliminary result)   Collection Time: 06/03/23  8:44 PM   Specimen: Urine, Random  Result Value Ref Range Status   Specimen Description   Final    URINE, RANDOM Performed at [facility], [address], [place], [place] [address]    Special Requests   Final    NONE Reflexed from [phone] [id number] Performed at [facility], [address] [place], [place] [address]    Culture >=100,000 COLONIES/mL ESCHERICHIA COLI (A)  Final   Report Status PENDING  Incomplete   Organism ID, Bacteria ESCHERICHIA COLI (A)  Final      Susceptibility   Escherichia coli - MIC*    AMPICILLIN >=32 RESISTANT Resistant     CEFAZOLIN <=4 SENSITIVE Sensitive     CEFEPIME <=0.12 SENSITIVE Sensitive     CEFTRIAXONE <=0.25 SENSITIVE  Sensitive     CIPROFLOXACIN <=0.25 SENSITIVE Sensitive  GENTAMICIN <=1 SENSITIVE Sensitive     IMIPENEM <=0.25 SENSITIVE Sensitive     NITROFURANTOIN <=16 SENSITIVE Sensitive     TRIMETH/SULFA <=20 SENSITIVE Sensitive     AMPICILLIN/SULBACTAM 16 INTERMEDIATE Intermediate     PIP/TAZO <=4 SENSITIVE Sensitive ug/mL    * >=100,000 COLONIES/mL ESCHERICHIA COLI  Resp panel by RT-PCR (RSV, Flu A&B, Covid) Anterior Nasal Swab     Status: None   Collection Time: 06/03/23  8:54 PM   Specimen: Anterior Nasal Swab  Result Value Ref Range Status   SARS Coronavirus 2 by RT PCR NEGATIVE NEGATIVE Final    Comment: (NOTE) SARS-CoV-2 target nucleic acids are NOT DETECTED.  The SARS-CoV-2 RNA is generally detectable in upper respiratory specimens during the acute phase of infection. The lowest concentration of SARS-CoV-2 viral copies this assay can detect is 138 copies/mL. A negative result does not preclude SARS-Cov-2 infection and should not be used as the sole basis for treatment or other patient management decisions. A negative result may occur with  improper specimen collection/handling, submission of specimen other than nasopharyngeal swab, presence of viral mutation(s) within the areas targeted by this assay, and inadequate number of viral copies(<138 copies/mL). A negative result must be combined with clinical observations, patient history, and epidemiological information. The expected result is Negative.  Fact Sheet for Patients:  BloggerCourse.com  Fact Sheet for Healthcare Providers:  SeriousBroker.it  This test is no t yet approved or cleared by the [place] FDA and  has been authorized for detection and/or diagnosis of SARS-CoV-2 by FDA under an Emergency Use Authorization (EUA). This EUA will remain  in effect (meaning this test can be used) for the duration of the COVID-19 declaration under Section 564(b)(1) of the Act,  21 U.S.C.section 360bbb-3(b)(1), unless the authorization is terminated  or revoked sooner.       Influenza A by PCR NEGATIVE NEGATIVE Final   Influenza B by PCR NEGATIVE NEGATIVE Final    Comment: (NOTE) The Xpert Xpress SARS-CoV-2/FLU/RSV plus assay is intended as an aid in the diagnosis of influenza from Nasopharyngeal swab specimens and should not be used as a sole basis for treatment. Nasal washings and aspirates are unacceptable for Xpert Xpress SARS-CoV-2/FLU/RSV testing.  Fact Sheet for Patients: BloggerCourse.com  Fact Sheet for Healthcare Providers: SeriousBroker.it  This test is not yet approved or cleared by the [place] FDA and has been authorized for detection and/or diagnosis of SARS-CoV-2 by FDA under an Emergency Use Authorization (EUA). This EUA will remain in effect (meaning this test can be used) for the duration of the COVID-19 declaration under Section 564(b)(1) of the Act, 21 U.S.C. section 360bbb-3(b)(1), unless the authorization is terminated or revoked.     Resp Syncytial Virus by PCR NEGATIVE NEGATIVE Final    Comment: (NOTE) Fact Sheet for Patients: BloggerCourse.com  Fact Sheet for Healthcare Providers: SeriousBroker.it  This test is not yet approved or cleared by the [place] FDA and has been authorized for detection and/or diagnosis of SARS-CoV-2 by FDA under an Emergency Use Authorization (EUA). This EUA will remain in effect (meaning this test can be used) for the duration of the COVID-19 declaration under Section 564(b)(1) of the Act, 21 U.S.C. section 360bbb-3(b)(1), unless the authorization is terminated or revoked.  Performed at [facility], [address] [place], [place] [address]   Culture, blood (Routine x 2)     Status: None (Preliminary result)   Collection Time: 06/03/23  9:28 PM   Specimen:  BLOOD  Result Value Ref Range Status   Specimen Description   Final    BLOOD BLOOD RIGHT ARM Performed at [facility], [address] [place], [place] [address]    Special Requests   Final    BOTTLES DRAWN AEROBIC AND ANAEROBIC Blood Culture results may not be optimal due to an inadequate volume of blood received in culture bottles Performed at [facility], [address] [place], [place] [address]    Culture   Final    NO GROWTH 3 DAYS Performed at [facility], [address], [place], [place] [address]    Report Status PENDING  Incomplete     Labs: BNP (last 3 results) No results for input(s): "BNP" in the last 8760 hours. Basic Metabolic Panel: Recent Labs  Lab 06/03/23 2021 06/04/23 0500  NA 133* 137  K 3.3* 4.1  CL 102 107  CO2 19* 22  GLUCOSE 117* 115*  BUN 9 7  CREATININE 0.73 0.71  CALCIUM 8.7* 8.3*  MG  --  2.2   Liver Function Tests: Recent Labs  Lab 06/03/23 2021 06/04/23 0500  AST 28 21  ALT 27 21  ALKPHOS 113 95  BILITOT 0.9 1.2  PROT 8.2* 6.7  ALBUMIN 3.5 2.8*   No results for input(s): "LIPASE", "AMYLASE" in the last 168 hours. No results for input(s): "AMMONIA" in the last 168 hours. CBC: Recent Labs  Lab 06/03/23 2021 06/04/23 0500 06/06/23 0437  WBC 20.3* 17.4* 8.4  NEUTROABS 17.0* 14.2*  --   HGB 11.1* 9.2* 8.5*  HCT 35.5* 29.9* 28.8*  MCV 80.3 81.7 83.7  PLT 445* 298 303   Cardiac Enzymes: No results for input(s): "CKTOTAL", "CKMB", "CKMBINDEX", "TROPONINI" in the last 168 hours. BNP: Invalid input(s): "POCBNP" CBG: No results for input(s): "GLUCAP" in the last 168 hours. D-Dimer No results for input(s): "DDIMER" in the last 72 hours. Hgb A1c No results for input(s): "HGBA1C" in the last 72 hours. Lipid Profile No results for input(s): "CHOL", "HDL", "LDLCALC", "TRIG", "CHOLHDL", "LDLDIRECT" in the last 72 hours. Thyroid function studies No results for input(s): "TSH",  "T4TOTAL", "T3FREE", "THYROIDAB" in the last 72 hours.  Invalid input(s): "FREET3" Anemia work up No results for input(s): "VITAMINB12", "FOLATE", "FERRITIN", "TIBC", "IRON", "RETICCTPCT" in the last 72 hours. Urinalysis    Component Value Date/Time   COLORURINE YELLOW 06/03/2023 2044   APPEARANCEUR TURBID (A) 06/03/2023 2044   LABSPEC 1.015 06/03/2023 2044   PHURINE 6.0 06/03/2023 2044   GLUCOSEU NEGATIVE 06/03/2023 2044   HGBUR MODERATE (A) 06/03/2023 2044   BILIRUBINUR NEGATIVE 06/03/2023 2044   KETONESUR NEGATIVE 06/03/2023 2044   PROTEINUR 100 (A) 06/03/2023 2044   UROBILINOGEN 1.0 09/14/2013 2310   NITRITE NEGATIVE 06/03/2023 2044   LEUKOCYTESUR LARGE (A) 06/03/2023 2044   Sepsis Labs Recent Labs  Lab 06/03/23 2021 06/04/23 0500 06/06/23 0437  WBC 20.3* 17.4* 8.4   Microbiology Recent Results (from the past 240 hours)  Culture, blood (Routine x 2)     Status: None (Preliminary result)   Collection Time: 06/03/23  8:21 PM   Specimen: BLOOD  Result Value Ref Range Status   Specimen Description   Final    BLOOD RIGHT ANTECUBITAL Performed at [facility], [address], [place], [place] [address]    Special Requests   Final    BOTTLES DRAWN AEROBIC AND ANAEROBIC Blood Culture adequate volume Performed at [facility], [address], [place], [place] [address]  Culture   Final    NO GROWTH 3 DAYS Performed at [facility], [address] [place], [place] [address]    Report Status PENDING  Incomplete  Urine Culture     Status: Abnormal (Preliminary result)   Collection Time: 06/03/23  8:44 PM   Specimen: Urine, Random  Result Value Ref Range Status   Specimen Description   Final    URINE, RANDOM Performed at [facility], [address], [place], [place] [address]    Special Requests   Final    NONE Reflexed from [id number] Performed at [facility], [address],  [place], [place] [address]    Culture >=100,000 COLONIES/mL ESCHERICHIA COLI (A)  Final   Report Status PENDING  Incomplete   Organism ID, Bacteria ESCHERICHIA COLI (A)  Final      Susceptibility   Escherichia coli - MIC*    AMPICILLIN >=32 RESISTANT Resistant     CEFAZOLIN <=4 SENSITIVE Sensitive     CEFEPIME <=0.12 SENSITIVE Sensitive     CEFTRIAXONE <=0.25 SENSITIVE Sensitive     CIPROFLOXACIN <=0.25 SENSITIVE Sensitive     GENTAMICIN <=1 SENSITIVE Sensitive     IMIPENEM <=0.25 SENSITIVE Sensitive     NITROFURANTOIN <=16 SENSITIVE Sensitive     TRIMETH/SULFA <=20 SENSITIVE Sensitive     AMPICILLIN/SULBACTAM 16 INTERMEDIATE Intermediate     PIP/TAZO <=4 SENSITIVE Sensitive ug/mL    * >=100,000 COLONIES/mL ESCHERICHIA COLI  Resp panel by RT-PCR (RSV, Flu A&B, Covid) Anterior Nasal Swab     Status: None   Collection Time: 06/03/23  8:54 PM   Specimen: Anterior Nasal Swab  Result Value Ref Range Status   SARS Coronavirus 2 by RT PCR NEGATIVE NEGATIVE Final    Comment: (NOTE) SARS-CoV-2 target nucleic acids are NOT DETECTED.  The SARS-CoV-2 RNA is generally detectable in upper respiratory specimens during the acute phase of infection. The lowest concentration of SARS-CoV-2 viral copies this assay can detect is 138 copies/mL. A negative result does not preclude SARS-Cov-2 infection and should not be used as the sole basis for treatment or other patient management decisions. A negative result may occur with  improper specimen collection/handling, submission of specimen other than nasopharyngeal swab, presence of viral mutation(s) within the areas targeted by this assay, and inadequate number of viral copies(<138 copies/mL). A negative result must be combined with clinical observations, patient history, and epidemiological information. The expected result is Negative.  Fact Sheet for Patients:  BloggerCourse.com  Fact Sheet for Healthcare Providers:   SeriousBroker.it  This test is no t yet approved or cleared by the [place] FDA and  has been authorized for detection and/or diagnosis of SARS-CoV-2 by FDA under an Emergency Use Authorization (EUA). This EUA will remain  in effect (meaning this test can be used) for the duration of the COVID-19 declaration under Section 564(b)(1) of the Act, 21 U.S.C.section 360bbb-3(b)(1), unless the authorization is terminated  or revoked sooner.       Influenza A by PCR NEGATIVE NEGATIVE Final   Influenza B by PCR NEGATIVE NEGATIVE Final    Comment: (NOTE) The Xpert Xpress SARS-CoV-2/FLU/RSV plus assay is intended as an aid in the diagnosis of influenza from Nasopharyngeal swab specimens and should not be used as a sole basis for treatment. Nasal washings and aspirates are unacceptable for Xpert Xpress SARS-CoV-2/FLU/RSV testing.  Fact Sheet for Patients: BloggerCourse.com  Fact Sheet for Healthcare Providers: SeriousBroker.it  This test is not yet approved or  cleared by the [place] and has been authorized for detection and/or diagnosis of SARS-CoV-2 by FDA under an Emergency Use Authorization (EUA). This EUA will remain in effect (meaning this test can be used) for the duration of the COVID-19 declaration under Section 564(b)(1) of the Act, 21 U.S.C. section 360bbb-3(b)(1), unless the authorization is terminated or revoked.     Resp Syncytial Virus by PCR NEGATIVE NEGATIVE Final    Comment: (NOTE) Fact Sheet for Patients: BloggerCourse.com  Fact Sheet for Healthcare Providers: SeriousBroker.it  This test is not yet approved or cleared by the [place] FDA and has been authorized for detection and/or diagnosis of SARS-CoV-2 by FDA under an Emergency Use Authorization (EUA). This EUA will remain in effect (meaning this test can be used) for  the duration of the COVID-19 declaration under Section 564(b)(1) of the Act, 21 U.S.C. section 360bbb-3(b)(1), unless the authorization is terminated or revoked.  Performed at [facility], [address], [place], [place] [address]   Culture, blood (Routine x 2)     Status: None (Preliminary result)   Collection Time: 06/03/23  9:28 PM   Specimen: BLOOD  Result Value Ref Range Status   Specimen Description   Final    BLOOD BLOOD RIGHT ARM Performed at [facility], [address] [place], [place] [address]    Special Requests   Final    BOTTLES DRAWN AEROBIC AND ANAEROBIC Blood Culture results may not be optimal due to an inadequate volume of blood received in culture bottles Performed at [facility], [address], [place], [place] [address]    Culture   Final    NO GROWTH 3 DAYS Performed at [facility], [address], [place], [place] [address]    Report Status PENDING  Incomplete    Please note: You were cared for by a hospitalist during your hospital stay. Once you are discharged, your primary care physician will handle any further medical issues. Please note that NO REFILLS for any discharge medications will be authorized once you are discharged, as it is imperative that you return to your primary care physician (or establish a relationship with a primary care physician if you do not have one) for your post hospital discharge needs so that they can reassess your need for medications and monitor your lab values.    Time coordinating discharge: 40 minutes  SIGNED:   Burnadette Pop, MD  [facility] 06/06/2023, 11:17 AM Pager [phone]  If 7PM-7AM, please contact night-coverage [email or web address] Password [email or web address]

## 2023-06-08 LAB — CULTURE, BLOOD (ROUTINE X 2)
Culture: NO GROWTH
Culture: NO GROWTH
Special Requests: ADEQUATE

## 2023-07-01 ENCOUNTER — Encounter: Payer: Self-pay | Admitting: Obstetrics & Gynecology

## 2023-07-01 ENCOUNTER — Other Ambulatory Visit: Payer: Self-pay

## 2023-07-01 ENCOUNTER — Emergency Department (HOSPITAL_COMMUNITY)
Admission: EM | Admit: 2023-07-01 | Discharge: 2023-07-01 | Disposition: A | Discharge status: Home / Self Care | Attending: Emergency Medicine | Admitting: Emergency Medicine

## 2023-07-01 ENCOUNTER — Encounter: Payer: Self-pay | Admitting: Cardiology

## 2023-07-01 ENCOUNTER — Ambulatory Visit: Payer: Medicaid Other | Admitting: Obstetrics & Gynecology

## 2023-07-01 ENCOUNTER — Encounter (HOSPITAL_COMMUNITY): Payer: Self-pay

## 2023-07-01 ENCOUNTER — Ambulatory Visit: Payer: Medicaid Other | Attending: Cardiology | Admitting: Cardiology

## 2023-07-01 ENCOUNTER — Emergency Department (HOSPITAL_COMMUNITY)

## 2023-07-01 VITALS — BP 122/84 | HR 105 | Wt 177.0 lb

## 2023-07-01 DIAGNOSIS — Z91040 Latex allergy status: Secondary | ICD-10-CM | POA: Insufficient documentation

## 2023-07-01 DIAGNOSIS — Z8632 Personal history of gestational diabetes: Secondary | ICD-10-CM | POA: Diagnosis not present

## 2023-07-01 DIAGNOSIS — E876 Hypokalemia: Secondary | ICD-10-CM | POA: Insufficient documentation

## 2023-07-01 DIAGNOSIS — J45909 Unspecified asthma, uncomplicated: Secondary | ICD-10-CM | POA: Diagnosis not present

## 2023-07-01 DIAGNOSIS — R109 Unspecified abdominal pain: Secondary | ICD-10-CM | POA: Diagnosis not present

## 2023-07-01 LAB — URINALYSIS, ROUTINE W REFLEX MICROSCOPIC
Bacteria, UA: NONE SEEN
Bilirubin Urine: NEGATIVE
Glucose, UA: NEGATIVE mg/dL
Hgb urine dipstick: NEGATIVE
Ketones, ur: 5 mg/dL — AB
Nitrite: NEGATIVE
Protein, ur: 30 mg/dL — AB
Specific Gravity, Urine: 1.03 (ref 1.005–1.030)
pH: 5 (ref 5.0–8.0)

## 2023-07-01 LAB — BASIC METABOLIC PANEL WITH GFR
Anion gap: 11 (ref 5–15)
BUN: 13 mg/dL (ref 6–20)
CO2: 20 mmol/L — ABNORMAL LOW (ref 22–32)
Calcium: 9.1 mg/dL (ref 8.9–10.3)
Chloride: 105 mmol/L (ref 98–111)
Creatinine, Ser: 0.71 mg/dL (ref 0.44–1.00)
GFR, Estimated: >60 mL/min (ref >60)
Glucose, Bld: 108 mg/dL — ABNORMAL HIGH (ref 70–99)
Potassium: 3.2 mmol/L — ABNORMAL LOW (ref 3.5–5.1)
Sodium: 136 mmol/L (ref 135–145)

## 2023-07-01 LAB — CBC
HCT: 34.7 % — ABNORMAL LOW (ref 36.0–46.0)
Hemoglobin: 10.8 g/dL — ABNORMAL LOW (ref 12.0–15.0)
MCH: 23.5 pg — ABNORMAL LOW (ref 26.0–34.0)
MCHC: 31.1 g/dL (ref 30.0–36.0)
MCV: 75.4 fL — ABNORMAL LOW (ref 80.0–100.0)
Platelets: 334 10*3/uL (ref 150–400)
RBC: 4.6 MIL/uL (ref 3.87–5.11)
RDW: 14.9 % (ref 11.5–15.5)
WBC: 9.7 10*3/uL (ref 4.0–10.5)
nRBC: 0 % (ref 0.0–0.2)

## 2023-07-01 LAB — HCG, SERUM, QUALITATIVE: Preg, Serum: NEGATIVE

## 2023-07-01 MED ORDER — LIDOCAINE 5 % EX PTCH: 1.0000 | MEDICATED_PATCH | CUTANEOUS | 0 refills | Status: DC

## 2023-07-01 MED ORDER — ONDANSETRON 8 MG PO TBDP
8.0000 mg | ORAL_TABLET | Freq: Once | ORAL | Status: AC
  Administered 2023-07-01: 8 mg via ORAL
  Filled 2023-07-01: qty 1

## 2023-07-01 MED ORDER — HYDROCODONE-ACETAMINOPHEN 5-325 MG PO TABS
1.0000 | ORAL_TABLET | Freq: Once | ORAL | Status: AC
  Administered 2023-07-01: 1 via ORAL
  Filled 2023-07-01: qty 1

## 2023-07-01 MED ORDER — MELOXICAM 15 MG PO TABS: 15.0000 mg | ORAL_TABLET | Freq: Every day | ORAL | 0 refills | Status: DC | PRN

## 2023-07-01 MED ORDER — CYCLOBENZAPRINE HCL 10 MG PO TABS: 10.0000 mg | ORAL_TABLET | Freq: Two times a day (BID) | ORAL | 0 refills | Status: DC | PRN

## 2023-07-01 NOTE — ED Triage Notes (Signed)
 Left sided flank pain that started today, pt states when she urinates, her flank hurts worse. Had kidney infection about 3 weeks ago. Nausea, no vomiting. Recent c-section 6 weeks ago.

## 2023-07-01 NOTE — Progress Notes (Signed)
 Post Partum Visit Note  Joan Kim is a 23 y.o. G54P1011 female who presents for a postpartum visit. She is 6 weeks postpartum following a primary cesarean section for second stage arrest.  I have fully reviewed the prenatal and intrapartum course; had A1GDM and fetal LGA and mild preeclampsia. The delivery was at [redacted]w[redacted]d gestational weeks.  Anesthesia: epidural. Postpartum course has been well. Baby is doing well. Baby is feeding by bottle - COSTCO . Bleeding no bleeding. Bowel function is normal. Bladder function is normal. Patient is not sexually active. Contraception method is none. Postpartum depression screening: negative.  The pregnancy intention screening data noted above was reviewed. Potential methods of contraception were discussed. The patient elected to proceed with No data recorded.   Edinburgh Postnatal Depression Scale - 07/01/23 1117       Edinburgh Postnatal Depression Scale:  In the Past 7 Days   I have been able to laugh and see the funny side of things. 0    I have looked forward with enjoyment to things. 0    I have blamed myself unnecessarily when things went wrong. 2    I have been anxious or worried for no good reason. 1    I have felt scared or panicky for no good reason. 1    Things have been getting on top of me. 0    I have been so unhappy that I have had difficulty sleeping. 0    I have felt sad or miserable. 0    I have been so unhappy that I have been crying. 0    The thought of harming myself has occurred to me. 0    Edinburgh Postnatal Depression Scale Total 4             Health Maintenance Due  Topic Date Due   Pneumococcal Vaccine 29-60 Years old (1 of 2 - PCV) Never done   FOOT EXAM  Never done   OPHTHALMOLOGY EXAM  Never done   HEMOGLOBIN A1C  12/12/2020   COVID-19 Vaccine (3 - 2024-25 season) 11/28/2022    The following portions of the patient's history were reviewed and updated as appropriate: allergies, current medications, past  family history, past medical history, past social history, past surgical history, and problem list.  Review of Systems Pertinent items noted in HPI and remainder of comprehensive ROS otherwise negative.  Objective:  BP 122/84   Pulse (!) 105   Wt 177 lb (80.3 kg)   LMP 08/19/2022   BMI 28.57 kg/m    General:  alert and no distress   Breasts:  not indicated  Lungs: clear to auscultation bilaterally  Heart:  regular rate and rhythm  Abdomen: soft, non-tender; bowel sounds normal; no masses,  no organomegaly   Wound well approximated incision, C/D/I, no erythema, no induration. Healing well  GU exam:  not indicated       Assessment:   Normal postpartum exam.   Plan:   Essential components of care per ACOG recommendations:  1.  Mood and well being: Patient with negative depression screening today. Reviewed local resources for support.  - Patient tobacco use? No.   - hx of drug use? No.    2. Infant care and feeding:  -Patient currently breastmilk feeding? No.  -Social determinants of health (SDOH) reviewed in EPIC. No concerns.  3. Sexuality, contraception and birth spacing - Patient does not want a pregnancy in the next year.  Desired family size is 2  children.  - Reviewed reproductive life planning. Reviewed contraceptive methods based on pt preferences and effectiveness.  Patient desired FAM or LAM today.   - Discussed birth spacing of 18 months  4. Sleep and fatigue -Encouraged family/partner/community support of 4 hrs of uninterrupted sleep to help with mood and fatigue  5. Physical Recovery  - Discussed patients delivery and complications. She describes her labor as mixed. - Patient had a C-section failure to progress.  - Patient has urinary incontinence? No. - Patient is safe to resume physical and sexual activity  6.  Health Maintenance - HM due items addressed Yes - Last pap smear  Diagnosis  Date Value Ref Range Status  11/18/2022   Final   - Negative  for intraepithelial lesion or malignancy (NILM)   Pap smear not done at today's visit.  -Breast Cancer screening indicated? No.   7. Chronic Disease/Pregnancy Condition follow up: Gestational Diabetes and mild preeclampsia - Will return for postpartum 2 hr GTT - BP stable, no meds - PCP follow up  Jaynie Collins, MD [facility], [facility]

## 2023-07-01 NOTE — Discharge Instructions (Signed)
 As discussed, your workup today was overall reassuring.  CT scan did not show evidence of kidney stone.  Your urine did not appear infected.  Suspect your symptoms could be muscular in nature.  Will trial NSAIDs as well as muscle laxer at home to treat your symptoms.  Recommend follow-up with your primary care for reassessment of your symptoms.  Please do not hesitate to return to the emergency department if the worrisome signs and symptoms we discussed become apparent.

## 2023-07-01 NOTE — Patient Instructions (Signed)
 Medication Instructions:  Your physician recommends that you continue on your current medications as directed. Please refer to the Current Medication list given to you today.  *If you need a refill on your cardiac medications before your next appointment, please call your pharmacy*  Follow-Up: At [facility], you and your health needs are our priority.  As part of our continuing mission to provide you with exceptional heart care, our providers are all part of one team.  This team includes your primary Cardiologist (physician) and Advanced Practice Providers or APPs (Physician Assistants and Nurse Practitioners) who all work together to provide you with the care you need, when you need it.  Your next appointment:    As needed  Provider:   Thomasene Ripple, DO     Other Instructions \  1st Floor: - Lobby - Registration  - Pharmacy  - Lab - Cafe  2nd Floor: - PV Lab - Diagnostic Testing (echo, CT, nuclear med)  3rd Floor: - Vacant  4th Floor: - TCTS (cardiothoracic surgery) - AFib Clinic - Structural Heart Clinic - Vascular Surgery  - Vascular Ultrasound  5th Floor: - HeartCare Cardiology (general and EP) - Clinical Pharmacy for coumadin, hypertension, lipid, weight-loss medications, and med management appointments    Valet parking services will be available as well.

## 2023-07-01 NOTE — ED Provider Notes (Signed)
 [place] EMERGENCY DEPARTMENT AT [facility] Provider Note   CSN: [id number] Arrival date & time: 07/01/23  2059     History  Chief Complaint  Patient presents with   Flank Pain    Joan Kim is a 23 y.o. female.   Flank Pain   23 year old female presents emergency department with complaints of sudden onset left-sided flank pain.  States that pain occurred when she was sitting in bed.  Denies radiation of pain.  Does report the flank pain hurts whenever she tries to urinate.  Denies any fevers, chills, vomiting, vaginal symptoms, change in bowel habits.  Patient does report history of cesarean section 6 weeks ago states she saw her OB GYN today with "everything being okay."  Denies history of kidney stones.  Past medical history significant for Hashimoto's thyroiditis, asthma, GAD, iron deficiency anemia  Home Medications Prior to Admission medications   Medication Sig Start Date End Date Taking? Authorizing Provider  lidocaine (LIDODERM) 5 % Place 1 patch onto the skin daily. Remove & Discard patch within 12 hours or as directed by MD 07/01/23  Yes Peter Garter, PA  meloxicam (MOBIC) 15 MG tablet Take 1 tablet (15 mg total) by mouth daily as needed for pain. 07/01/23  Yes Sherian Maroon A, PA  acetaminophen (TYLENOL) 325 MG tablet Take 650 mg by mouth every 6 (six) hours as needed.    [provider]  albuterol (VENTOLIN HFA) 108 (90 Base) MCG/ACT inhaler INHALE 2 PUFFS INTO THE LUNGS EVERY 4 HOURS AS NEEDED FOR WHEEZING OR SHORTNESS OF BREATH. 12/13/22   Reva Bores, MD  busPIRone (BUSPAR) 5 MG tablet Take 5 mg by mouth 3 (three) times daily.    [provider]  famotidine (PEPCID) 20 MG tablet Take 1 tablet (20 mg total) by mouth 2 (two) times daily. Patient not taking: Reported on 07/01/2023 01/13/23   Federico Flake, MD  glucose blood test strip Use as instructed 03/11/23   Constant, Peggy, MD  sertraline (ZOLOFT) 100 MG tablet  Take 1 tablet (100 mg total) by mouth daily. 10/08/22   Federico Flake, MD      Allergies    Latex    Review of Systems   Review of Systems  Genitourinary:  Positive for flank pain.  All other systems reviewed and are negative.   Physical Exam Updated Vital Signs BP 119/84 (BP Location: Left Arm)   Pulse 73   Temp 97.7 F (36.5 C) (Oral)   Resp 20   Ht 5\' 6"  (1.676 m)   Wt 77.1 kg   LMP 08/19/2022   SpO2 100%   BMI 27.44 kg/m  Physical Exam Vitals and nursing note reviewed.  Constitutional:      General: She is not in acute distress.    Appearance: She is well-developed.  HENT:     Head: Normocephalic and atraumatic.  Eyes:     Conjunctiva/sclera: Conjunctivae normal.  Cardiovascular:     Rate and Rhythm: Normal rate and regular rhythm.     Heart sounds: No murmur heard. Pulmonary:     Effort: Pulmonary effort is normal. No respiratory distress.     Breath sounds: Normal breath sounds.  Abdominal:     Palpations: Abdomen is soft.     Tenderness: There is left CVA tenderness. There is no right CVA tenderness or guarding.  Musculoskeletal:        General: No swelling.     Cervical back: Neck supple.  Skin:    General: Skin is warm and dry.     Capillary Refill: Capillary refill takes less than 2 seconds.  Neurological:     Mental Status: She is alert.  Psychiatric:        Mood and Affect: Mood normal.     ED Results / Procedures / Treatments   Labs (all labs ordered are listed, but only abnormal results are displayed) Labs Reviewed  URINALYSIS, ROUTINE W REFLEX MICROSCOPIC - Abnormal; Notable for the following components:      Result Value   APPearance HAZY (*)    Ketones, ur 5 (*)    Protein, ur 30 (*)    Leukocytes,Ua TRACE (*)    All other components within normal limits  BASIC METABOLIC PANEL WITH GFR - Abnormal; Notable for the following components:   Potassium 3.2 (*)    CO2 20 (*)    Glucose, Bld 108 (*)    All other components within  normal limits  CBC - Abnormal; Notable for the following components:   Hemoglobin 10.8 (*)    HCT 34.7 (*)    MCV 75.4 (*)    MCH 23.5 (*)    All other components within normal limits  HCG, SERUM, QUALITATIVE    EKG None  Radiology CT Renal Stone Study Result Date: 07/01/2023 CLINICAL DATA:  Flank pain EXAM: CT ABDOMEN AND PELVIS WITHOUT CONTRAST TECHNIQUE: Multidetector CT imaging of the abdomen and pelvis was performed following the standard protocol without IV contrast. RADIATION DOSE REDUCTION: This exam was performed according to the departmental dose-optimization program which includes automated exposure control, adjustment of the mA and/or kV according to patient size and/or use of iterative reconstruction technique. COMPARISON:  05/06/2023 FINDINGS: Lower chest: No acute abnormality. Hepatobiliary: No focal liver abnormality is seen. No gallstones, gallbladder wall thickening, or biliary dilatation. Pancreas: Unremarkable. No pancreatic ductal dilatation or surrounding inflammatory changes. Spleen: Normal in size without focal abnormality. Adrenals/Urinary Tract: Adrenal glands are within normal limits. Kidneys appear within normal limits. No calculi or obstructive changes are noted. Bladder is decompressed. Stomach/Bowel: Appendix is within normal limits. No obstructive or inflammatory changes of the colon are seen. Small bowel stomach are unremarkable. Vascular/Lymphatic: No significant vascular findings are present. No enlarged abdominal or pelvic lymph nodes. Reproductive: Uterus and bilateral adnexa are unremarkable. Other: No abdominal wall hernia or abnormality. No abdominopelvic ascites. Musculoskeletal: No acute or significant osseous findings. IMPRESSION: No acute abnormality noted. Electronically Signed   By: Alcide Clever M.D.   On: 07/01/2023 22:57    Procedures Procedures    Medications Ordered in ED Medications  HYDROcodone-acetaminophen (NORCO/VICODIN) 5-325 MG per  tablet 1 tablet (1 tablet Oral Given 07/01/23 2218)  ondansetron (ZOFRAN-ODT) disintegrating tablet 8 mg (8 mg Oral Given 07/01/23 2218)    ED Course/ Medical Decision Making/ A&P                                 Medical Decision Making Amount and/or Complexity of Data Reviewed Labs: ordered. Radiology: ordered.  Risk Prescription drug management.   This patient presents to the ED for concern of left flank pain, this involves an extensive number of treatment options, and is a complaint that carries with it a high risk of complications and morbidity.  The differential diagnosis includes possible eczema nephrolithiasis, ovarian cyst, diverticulitis, ectopic pregnancy, to ovarian abscess, musculoskeletal, aortic dissection, other   Co morbidities that complicate the patient  evaluation  See HPI   Additional history obtained:  Additional history obtained from EMR External records from outside source obtained and reviewed including all the records   Lab Tests:  I Ordered, and personally interpreted labs.  The pertinent results include: No leukocytosis.  Anemia with a hemoglobin of 10.8.  Platelets within range.  Mild hypokalemia 3.2, decreased bicarb 20 otherwise, S within limits.  No renal dysfunction.  CG negative.  UA with 6-10 WBCs, contaminated with 6-10 squamous epithelial cells, 5 ketones, 30 proteins but otherwise unremarkable.    Imaging Studies ordered:  I ordered imaging studies including CT renal stone study I independently visualized and interpreted imaging which showed no acute abnormalities I agree with the radiologist interpretation   Cardiac Monitoring: / EKG:  The patient was maintained on a cardiac monitor.  I personally viewed and interpreted the cardiac monitored which showed an underlying rhythm of: Sinus rhythm   Consultations Obtained:  N/a   Problem List / ED Course / Critical interventions / Medication management  Left flank pain I ordered  medication including Norco, Zofran   Reevaluation of the patient after these medicines showed that the patient improved I have reviewed the patients home medicines and have made adjustments as needed   Social Determinants of Health:  Denies tobacco, or drug use.   Test / Admission - Considered:  Left flank pain Vitals signs within normal range and stable throughout visit. Laboratory/imaging studies significant for: See above 23 year old female presents emergency department with complaints of sudden onset left-sided flank pain.  States that pain occurred when she was sitting in bed.  Denies radiation of pain.  Does report the flank pain hurts whenever she tries to urinate.  Denies any fevers, chills, vomiting, vaginal symptoms, change in bowel habits.  Patient does report history of cesarean section 6 weeks ago states she saw her OB GYN today with "everything being okay."  Denies history of kidney stones. On exam, left-sided CVA tenderness versus lumbar paraspinal tenderness.  Patient's workup today overall reassuring.  Labs without evidence of acute emergent process.  UA without gross hematuria, evidence of infectious etiology.  CT imaging without evidence of kidney stone or other intra-abdominal/pelvic abnormality as cause of patient's symptoms.  Low suspicion for aortic dissection; patient without abdominal pain rating to back, pulse deficits, neurodeficits, hypertension.  Symptoms could be secondary to musculoskeletal etiology.  Will trial NSAIDs, muscle relaxer in the outpatient setting for treatment of symptoms.  Recommend follow-up with PCP in the outpatient setting for reassessment.  Treatment plan discussed with the patient and she knowledge understanding was agreeable to said plan.  Patient overall well-appearing, afebrile in no acute distress. Worrisome signs and symptoms were discussed with the patient, and the patient acknowledged understanding to return to the ED if noticed. Patient was  stable upon discharge.          Final Clinical Impression(s) / ED Diagnoses Final diagnoses:  Left flank pain    Rx / DC Orders      Peter Garter, PA 07/02/23 1610    Glyn Ade, MD 07/02/23 [phone]

## 2023-07-01 NOTE — Progress Notes (Signed)
 Cardio-Obstetrics Clinic  Follow Up Note   Date:  07/01/2023   ID:  Joan Kim, DOB 2000-07-10, MRN [id number]  PCP:  [facility]   [facility] Providers Cardiologist:  Thomasene Ripple, DO  Electrophysiologist:  None        Referring MD: Reva Bores, MD   Chief Complaint: " I am ok"   History of Present Illness:    Joan Kim is a 23 y.o. female [G2P1011] who returns for follow up.   Medical history includes Asthma and GERD.   She is 6 weeks postpartum - she is doing well from a CV standpoint.    Prior CV Studies Reviewed: The following studies were reviewed today: Reviewed echo and monitor   Past Medical History:  Diagnosis Date   Asthma    Bacterial vaginitis    Dyspepsia 08/30/2019   Fatigue 08/12/2020   GERD (gastroesophageal reflux disease)    H/O bladder infections    Helicobacter pylori antibody positive 03/2019   Insomnia 09/18/2020   Migraine without aura and without status migrainosus, not intractable 04/23/2020   Nausea/vomiting in pregnancy 10/22/2022    Past Surgical History:  Procedure Laterality Date   CESAREAN SECTION N/A 05/20/2023   Procedure: CESAREAN SECTION;  Surgeon: Kathrynn Running, MD;  Location: [facility];  Service: Obstetrics;  Laterality: N/A;      OB History     Gravida  2   Para  1   Term  1   Preterm  0   AB  1   Living  1      SAB  1   IAB  0   Ectopic  0   Multiple  0   Live Births  1               Current Medications: Current Meds  Medication Sig   acetaminophen (TYLENOL) 325 MG tablet Take 650 mg by mouth every 6 (six) hours as needed.   albuterol (VENTOLIN HFA) 108 (90 Base) MCG/ACT inhaler INHALE 2 PUFFS INTO THE LUNGS EVERY 4 HOURS AS NEEDED FOR WHEEZING OR SHORTNESS OF BREATH.   busPIRone (BUSPAR) 5 MG tablet Take 5 mg by mouth 3 (three) times daily.   glucose blood test strip Use as instructed   sertraline (ZOLOFT) 100 MG tablet  Take 1 tablet (100 mg total) by mouth daily.     Allergies:   Latex   Social History   Socioeconomic History   Marital status: Married    Spouse name: Not on file   Number of children: Not on file   Years of education: Not on file   Highest education level: Not on file  Occupational History   Occupation: Student  Tobacco Use   Smoking status: Never    Passive exposure: Yes   Smokeless tobacco: Never  Vaping Use   Vaping status: Never Used  Substance and Sexual Activity   Alcohol use: Not Currently   Drug use: Never   Sexual activity: Yes    Partners: Male  Other Topics Concern   Not on file  Social History Narrative   Single lives with her parents splits time between each layer divorced.   Graduate [place] 2020   Working for [employer]   No EtOH, tobacco, caffeine, drugs   Social Drivers of Corporate investment banker Strain: Not on file  Food Insecurity: No Food Insecurity (06/04/2023)   Hunger Vital Sign  Worried About Programme researcher, broadcasting/film/video in the Last Year: Never true    Ran Out of Food in the Last Year: Never true  Transportation Needs: No Transportation Needs (06/04/2023)   PRAPARE - Administrator, Civil Service (Medical): No    Lack of Transportation (Non-Medical): No  Physical Activity: Not on file  Stress: Not on file  Social Connections: Not on file      Family History  Problem Relation Age of Onset   Emphysema Maternal Uncle    Hypertension Maternal Grandmother    Diabetes Paternal Grandmother    Hypertension Paternal Grandfather    Heart disease Paternal Grandfather    Bladder Cancer Paternal Grandfather    Irritable bowel syndrome Other    Colon cancer Neg Hx    Esophageal cancer Neg Hx    Rectal cancer Neg Hx    Stomach cancer Neg Hx       ROS:   Please see the history of present illness.     All other systems reviewed and are negative.   Labs/EKG Reviewed:    EKG:   EKG was not  ordered today.    Recent  Labs: 04/07/2023: TSH 0.609 06/04/2023: ALT 21; BUN 7; Creatinine, Ser 0.71; Magnesium 2.2; Potassium 4.1; Sodium 137 06/06/2023: Hemoglobin 8.5; Platelets 303   Recent Lipid Panel No results found for: "CHOL", "TRIG", "HDL", "CHOLHDL", "LDLCALC", "LDLDIRECT"  Physical Exam:    VS:  BP 122/84 (BP Location: Left Arm, Patient Position: Sitting, Cuff Size: Normal)   Pulse (!) 105   Ht 5\' 6"  (1.676 m)   Wt 177 lb (80.3 kg)   LMP 08/19/2022   BMI 28.57 kg/m     Wt Readings from Last 3 Encounters:  07/01/23 177 lb (80.3 kg)  06/06/23 165 lb 2 oz (74.9 kg)  05/27/23 177 lb (80.3 kg)     GEN:  Well nourished, well developed in no acute distress HEENT: Normal NECK: No JVD; No carotid bruits LYMPHATICS: No lymphadenopathy CARDIAC: RRR, no murmurs, rubs, gallops RESPIRATORY:  Clear to auscultation without rales, wheezing or rhonchi  ABDOMEN: Soft, non-tender, non-distended MUSCULOSKELETAL:  No edema; No deformity  SKIN: Warm and dry NEUROLOGIC:  Alert and oriented x 3 PSYCHIATRIC:  Normal affect    Risk Assessment/Risk Calculators:                  ASSESSMENT & PLAN:    Postpartum Visit patient is doing well from her medical standpoint.  No complaints at this time.  Total symptom resolution. She will follow-up with me as needed    Patient Instructions  Medication Instructions:  Your physician recommends that you continue on your current medications as directed. Please refer to the Current Medication list given to you today.  *If you need a refill on your cardiac medications before your next appointment, please call your pharmacy*  Follow-Up: At [facility], you and your health needs are our priority.  As part of our continuing mission to provide you with exceptional heart care, our providers are all part of one team.  This team includes your primary Cardiologist (physician) and Advanced Practice Providers or APPs (Physician Assistants and Nurse Practitioners) who  all work together to provide you with the care you need, when you need it.  Your next appointment:    As needed  Provider:   Thomasene Ripple, DO    Other Instructions:    1st Floor: - Lobby - Registration  - Pharmacy  -  Lab - Cafe  2nd Floor: - PV Lab - Diagnostic Testing (echo, CT, nuclear med)  3rd Floor: - Vacant  4th Floor: - TCTS (cardiothoracic surgery) - AFib Clinic - Structural Heart Clinic - Vascular Surgery  - Vascular Ultrasound  5th Floor: - HeartCare Cardiology (general and EP) - Clinical Pharmacy for coumadin, hypertension, lipid, weight-loss medications, and med management appointments    Valet parking services will be available as well.      Dispo:  No follow-ups on file.   Medication Adjustments/Labs and Tests Ordered: Current medicines are reviewed at length with the patient today.  Concerns regarding medicines are outlined above.  Tests Ordered: No orders of the defined types were placed in this encounter.  Medication Changes: No orders of the defined types were placed in this encounter.

## 2023-07-04 ENCOUNTER — Other Ambulatory Visit

## 2023-07-08 ENCOUNTER — Other Ambulatory Visit

## 2023-07-08 DIAGNOSIS — Z8632 Personal history of gestational diabetes: Secondary | ICD-10-CM

## 2023-07-09 LAB — GLUCOSE TOLERANCE, 2 HOURS
Glucose, 2 hour: 77 mg/dL (ref 70–139)
Glucose, GTT - Fasting: 83 mg/dL (ref 70–99)

## 2023-07-11 ENCOUNTER — Encounter: Payer: Self-pay | Admitting: Obstetrics & Gynecology

## 2023-07-14 DIAGNOSIS — E063 Autoimmune thyroiditis: Secondary | ICD-10-CM | POA: Diagnosis not present

## 2023-07-31 ENCOUNTER — Encounter: Payer: Self-pay | Admitting: Family Medicine

## 2023-08-02 ENCOUNTER — Ambulatory Visit (INDEPENDENT_AMBULATORY_CARE_PROVIDER_SITE_OTHER)

## 2023-08-02 DIAGNOSIS — N390 Urinary tract infection, site not specified: Secondary | ICD-10-CM

## 2023-08-02 LAB — POCT URINALYSIS DIPSTICK

## 2023-08-02 MED ORDER — NITROFURANTOIN MONOHYD MACRO 100 MG PO CAPS: 100.0000 mg | ORAL_CAPSULE | Freq: Two times a day (BID) | ORAL | 1 refills | Status: DC

## 2023-08-02 NOTE — Addendum Note (Signed)
 Addended by: Farris Hong on: 08/02/2023 03:44 PM   Modules accepted: Orders

## 2023-08-02 NOTE — Progress Notes (Signed)
 SUBJECTIVE:  23 y.o. female complains of UTI symptoms 1 week(s) dysuria, urinary urgency, and urinary frequency Denies abnormal vaginal bleeding or significant pelvic pain or fever.Denies history of known exposure to STD.  No LMP recorded.  OBJECTIVE:  She appears alert, well appearing, in no apparent distress Urine dipstick: positive for WBC's and positive for leukocytes.  ASSESSMENT:  Dysuria  Urinary frequency Urinary urgency    PLAN:  urine culture sent to lab. Treatment: Macrobid  100 mg bid x 7 days sent into pharmacy  ROV prn if symptoms persist or worsen.

## 2023-08-02 NOTE — Addendum Note (Signed)
 Addended by: Farris Hong on: 08/02/2023 11:45 AM   Modules accepted: Orders

## 2023-08-04 ENCOUNTER — Encounter: Payer: Self-pay | Admitting: Obstetrics & Gynecology

## 2023-08-04 LAB — URINE CULTURE

## 2023-08-10 ENCOUNTER — Ambulatory Visit: Admitting: Obstetrics & Gynecology

## 2023-08-10 ENCOUNTER — Encounter: Payer: Self-pay | Admitting: Obstetrics & Gynecology

## 2023-08-10 VITALS — BP 120/79 | HR 82 | Wt 168.0 lb

## 2023-08-10 DIAGNOSIS — R3914 Feeling of incomplete bladder emptying: Secondary | ICD-10-CM | POA: Diagnosis not present

## 2023-08-10 DIAGNOSIS — N812 Incomplete uterovaginal prolapse: Secondary | ICD-10-CM | POA: Diagnosis not present

## 2023-08-10 NOTE — Progress Notes (Signed)
 GYNECOLOGY OFFICE VISIT NOTE  History:    Joan Kim is a 23 y.o. G2P1011 here today for evaluation of bulge felt in vagina and feeling like she is incompletely emptying her bladder for a few weeks now.  Recently treated for a UTI.  Recent pregnancy, had cesarean section on 05/20/23 after 4 hours of pushing. Feels the bulge more when she is using the toilet and lying down,  She denies any abnormal vaginal discharge, bleeding, pelvic pain or other concerns.    Past Medical History:  Diagnosis Date   Acute pyelonephritis 06/04/2023   Asthma    Bacterial vaginitis    Dyspepsia 08/30/2019   Fatigue 08/12/2020   GERD (gastroesophageal reflux disease)    Gestational diabetes mellitus (GDM) affecting pregnancy, antepartum 03/11/2023   Normal 2 hr postpartum GTT   H/O bladder infections    Hashimoto's thyroiditis 08/30/2019   Helicobacter pylori antibody positive 03/2019   Insomnia 09/18/2020   Migraine without aura and without status migrainosus, not intractable 04/23/2020   Mild preeclampsia 05/18/2023   Nausea/vomiting in pregnancy 10/22/2022   Near syncope 11/03/2022    Past Surgical History:  Procedure Laterality Date   CESAREAN SECTION N/A 05/20/2023   Procedure: CESAREAN SECTION;  Surgeon: Janeane Mealy, MD;  Location: [facility] LD [facility]S;  Service: Obstetrics;  Laterality: N/A;    The following portions of the patient's history were reviewed and updated as appropriate: allergies, current medications, past family history, past medical history, past social history, past surgical history and problem list.   Health Maintenance:  Normal pap on 11/18/22.   Review of Systems:  Pertinent items noted in HPI and remainder of comprehensive ROS otherwise negative.  Physical Exam:  There were no vitals taken for this visit. CONSTITUTIONAL: Well-developed, well-nourished female in no acute distress.  HEENT:  Normocephalic, atraumatic. External right and left ear normal. No scleral  icterus.  NECK: Normal range of motion, supple, no masses noted on observation SKIN: No rash noted. Not diaphoretic. No erythema. No pallor. MUSCULOSKELETAL: Normal range of motion. No edema noted. NEUROLOGIC: Alert and oriented to person, place, and time. Normal muscle tone coordination. No cranial nerve deficit noted on observation. PSYCHIATRIC: Normal mood and affect. Normal behavior. Normal judgment and thought content. CARDIOVASCULAR: Normal heart rate noted RESPIRATORY: Effort and breath sounds normal, no problems with respiration noted ABDOMEN: No masses or other overt distention noted on observation. No tenderness.   PELVIC: Normal appearing external genitalia; normal urethral meatus; normal appearing vaginal mucosa and cervix.  Grade 1 uterine prolapse noted on exam, with Valsalva. Currently on menstrual period.   Performed in the presence of a chaperone     Assessment and Plan:     1. Feeling of incomplete bladder emptying 2. First degree uterine prolapse (Primary) Patient was referred for pelvic floor physical therapy and also Urogynecology for further evaluation. Patient was assured that her symptoms could be due to being postpartum, they may resolve/improve on their own but physical therapy can help.   - Ambulatory referral to Physical Therapy - Ambulatory referral to Urogynecology  Routine preventative health maintenance measures emphasized. Please refer to After Visit Summary for other counseling recommendations.   Return for any gynecologic concerns.    I spent 25 minutes dedicated to the care of this patient including pre-visit review of records, face to face time with the patient discussing her conditions and treatments, post visit ordering of medications and appropriate tests or procedures, coordinating care and documenting this visit encounter.  Lenoard Rad, MD, FACOG Obstetrician & Gynecologist, [facility] for [employer], [facility]

## 2023-10-11 ENCOUNTER — Other Ambulatory Visit: Payer: Self-pay

## 2023-10-11 ENCOUNTER — Encounter: Payer: Self-pay | Admitting: Physical Therapy

## 2023-10-11 ENCOUNTER — Ambulatory Visit: Attending: Obstetrics & Gynecology | Admitting: Physical Therapy

## 2023-10-11 DIAGNOSIS — N812 Incomplete uterovaginal prolapse: Secondary | ICD-10-CM | POA: Diagnosis not present

## 2023-10-11 DIAGNOSIS — R3914 Feeling of incomplete bladder emptying: Secondary | ICD-10-CM | POA: Diagnosis not present

## 2023-10-11 DIAGNOSIS — R293 Abnormal posture: Secondary | ICD-10-CM | POA: Diagnosis present

## 2023-10-11 DIAGNOSIS — M6281 Muscle weakness (generalized): Secondary | ICD-10-CM | POA: Insufficient documentation

## 2023-10-11 NOTE — Therapy (Signed)
 OUTPATIENT PHYSICAL THERAPY FEMALE PELVIC EVALUATION   Patient Name: Joan Kim MRN: [id number] DOB:2000/09/30, 23 y.o., female Today's Date: 10/11/2023  END OF SESSION:  PT End of Session - 10/11/23 1511     Visit Number 1    Date for PT Re-Evaluation 04/12/23    Authorization Type [place] MEDICAID [employer]    PT Start Time 1447    PT Stop Time 1536    PT Time Calculation (min) 49 min    Activity Tolerance Patient tolerated treatment well    Behavior During Therapy WFL for tasks assessed/performed          Past Medical History:  Diagnosis Date   Acute pyelonephritis 06/04/2023   Asthma    Bacterial vaginitis    Dyspepsia 08/30/2019   Fatigue 08/12/2020   GERD (gastroesophageal reflux disease)    Gestational diabetes mellitus (GDM) affecting pregnancy, antepartum 03/11/2023   Normal 2 hr postpartum GTT   H/O bladder infections    Hashimoto's thyroiditis 08/30/2019   Helicobacter pylori antibody positive 03/2019   Insomnia 09/18/2020   Migraine without aura and without status migrainosus, not intractable 04/23/2020   Mild preeclampsia 05/18/2023   Nausea/vomiting in pregnancy 10/22/2022   Near syncope 11/03/2022   Past Surgical History:  Procedure Laterality Date   CESAREAN SECTION N/A 05/20/2023   Procedure: CESAREAN SECTION;  Surgeon: Kandis Devaughn Sayres, MD;  Location: [facility] LD [facility];  Service: Obstetrics;  Laterality: N/A;   Patient Active Problem List   Diagnosis Date Noted   Fever 06/04/2023   Leukocytosis 06/04/2023   Hypokalemia 06/04/2023   GAD (generalized anxiety disorder) 06/04/2023   Chronic anemia 06/04/2023   Family history of spina bifida 11/18/2022   Mild intermittent asthma 11/18/2022   Iron  deficiency 12/26/2019   Anxiety and depression 10/04/2019   Hashimoto's thyroiditis 08/30/2019   Vitamin D deficiency 08/30/2019    PCP: [facility]  REFERRING PROVIDER: Herchel Gloris LABOR,  MD  REFERRING DIAG: R39.14 (ICD-10-CM) - Feeling of incomplete bladder emptying N81.2 (ICD-10-CM) - First degree uterine prolapse THERAPY DIAG:  Muscle weakness (generalized)  Abnormal posture  Rationale for Evaluation and Treatment: Rehabilitation  ONSET DATE: 04/2023  SUBJECTIVE:                                                                                                                                                                                           SUBJECTIVE STATEMENT: Patient had  a baby 5 months ago, she got induced, she pushed for 4 hours, in labor for 36 hours, ended up with a C section. Feels fine now, does something and then pees  herself. It's not a whole lot, changes underwear. Sometimes she feels like she has to pee, and only a little about it.  Has had PT for her knees.  She hemorrhaged after the C section, she could have died, she had kidney infection and sepsis, foley catheter, a green thing for 2 day. She got weak.  Feels good now, does not like that she is peeing herself.  Has common UTIs, every year.  Speculum always uncomfortable, does not use tampons.  Had really painful periods Fluid intake: water   PAIN:  Are you having pain? Yes scar- abdominal scar NPRS scale: up to 5/10   PRECAUTIONS: None  RED FLAGS: None   WEIGHT BEARING RESTRICTIONS: No  FALLS:  Has patient fallen in last 6 months? 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  ACTIVITY LEVEL : active, window shops, gets out of the house, does not exercise  PLOF: Independent  PATIENT GOALS: stay dry  PERTINENT HISTORY:  C section 04/2023 Sexual abuse: No  BOWEL MOVEMENT: sometimes constipation- every 2 days or so Pain with bowel movement: Yes a lot of times, has pain with BMs, normal for her, stuck a finger  Type of bowel movement:Type (Bristol Stool Scale) 3-4 Fully empty rectum: No Leakage: No Pads: No Fiber supplement/laxative Yes   URINATION: Pain with urination: No Fully empty  bladder: No- does a full pee and has to go back Stream: Strong Urgency: Yes  Frequency: yes Leakage: Urge to void, Coughing, Sneezing, Laughing, Exercise, and Lifting Bowling, Skating Pads: Yes:    INTERCOURSE:  Ability to have vaginal penetration Yes  Pain with intercourse: Initial Penetration, During Penetration, Deep Penetration, and After Intercourse- worse now, always hurt even before baby DrynessNo Climax: no- never had one Marinoff Scale: 1/3 Laxative:  PREGNANCY: Vaginal deliveries 0 Tearing No Episiotomy No C-section deliveries 1 Currently pregnant No  PROLAPSE: None   OBJECTIVE:  Note: Objective measures were completed at Evaluation unless otherwise noted.    PFIQ-7: 24  COGNITION: Overall cognitive status: Within functional limits for tasks assessed     SENSATION: Light touch: Appears intact  LUMBAR SPECIAL TESTS:  Straight leg raise test: Positive for compensation with rotation of abdomen and pelvis      POSTURE: rounded shoulders, forward head, and increased lumbar lordosis   LUMBARAROM/PROM: full   LOWER EXTREMITY ROM: full   LOWER EXTREMITY MMT: weak hips and knees bilateral  4/5  PALPATION:   General: upper chest breathing strategies  Pelvic Alignment: even  Abdominal: tight and hypersensitive abdominal scar                External Perineal Exam: deferred to next visit due to time                             Internal Pelvic Floor: deferred to next visit due to time  Patient confirms identification and approves PT to assess internal pelvic floor and treatment Yes  PELVIC MMT:   MMT eval  Vaginal   Internal Anal Sphincter   External Anal Sphincter   Puborectalis   Diastasis Recti 2 fingers at and above umbilicus  (Blank rows = not tested)        TONE: deferred to next visit due to time  PROLAPSE: deferred to next visit due to time  TODAY'S TREATMENT:  DATE: 10/11/2023  EVAL EVAL Examination completed, findings reviewed, pt educated on POC, HEP, and female pelvic floor anatomy, reasoning with pelvic floor assessment internally with pt consent. Pt motivated to participate in PT and agreeable to attempt recommendations.    Manual- abdominal scar massage and cupping  PATIENT EDUCATION:  Education details: Pt was educated on relevant anatomy, exam findings, HEP, expectations of PT, cupping and scar massage   Person educated: Patient Education method: Explanation, Demonstration, Tactile cues, Verbal cues, and Handouts Education comprehension: verbalized understanding, returned demonstration, verbal cues required, tactile cues required, and needs further education  HOME EXERCISE PROGRAM: Access Code: [email or web address] URL: [email or web address] Date: 10/11/2023 Prepared by: Cori Sharetha Newson  Exercises - Supine Hip Adduction Isometric with Ball  - 1 x daily - 7 x weekly - 2 sets - 10 reps - Standing Shoulder Diagonal Horizontal Abduction 60/120 Degrees with Resistance  - 1 x daily - 7 x weekly - 2 sets - 10 reps  ASSESSMENT:  CLINICAL IMPRESSION: Patient is a 23 y.o. F who was seen today for physical therapy evaluation and treatment for mixed incontinence and dyspareunia. Exam findings are notable for upper chest  breathing strategies, abdominal weakness and diastasis, tight and hypersensitive abdominal scar. Patient demonstrates bilateral hip weakness, decreased strength in hips and knees, abnormal posture and pain. It is difficult for patient to have intercourse, she has never had orgasm due to likely high tone pelvic floor ( will be assessed next visit) . Discussed findings with patient, recommended cupping and abdominal massage  today, patient was educated on relevant anatomy, exam findings  and HEP was initiated. Patient's quality of life has been affected, patient will benefit from  physical therapy to address deficits, reduce SUI and improve abdominal strength and pain with pelvic exams and intercourse.    OBJECTIVE IMPAIRMENTS: decreased ROM, decreased strength, increased fascial restrictions, increased muscle spasms, impaired tone, and pain.   ACTIVITY LIMITATIONS: carrying, lifting, continence, and toileting  PARTICIPATION LIMITATIONS: interpersonal relationship and community activity  PERSONAL FACTORS: Age and Time since onset of injury/illness/exacerbation are also affecting patient's functional outcome.   REHAB POTENTIAL: Good  CLINICAL DECISION MAKING: Evolving/moderate complexity  EVALUATION COMPLEXITY: Moderate   GOALS: Goals reviewed with patient? Yes  SHORT TERM GOALS: Target date: 11/08/2023     Pt will be I and consistent with her HEP and demonstrate all exercises correctly  Baseline: Goal status: INITIAL  2.  Patient will be educated on dilators to reduce pelvic pain with intercourse and pelvic exams  Baseline:  Goal status: INITIAL  3.  Patient will be educated on cupping and scar massage Baseline:  Goal status: INITIAL   LONG TERM GOALS: Target date: 04/12/2023  Patient able to demonstrate correct pressure management to reduce strain on her pelvic floor.   Baseline:  Goal status: INITIAL  2.   Pt will be independent with dilator program and progressions to decrease vaginal sensitization and return to pain free vaginal penetration. Baseline:  Goal status: INITIAL  3.  Pt will soak 0 pads/ day in order to run errands and not have to interrupt daily tasks.  Baseline: changes liners several times/day Goal status: INITIAL  4.  Pt will demonstrate 20 point improvement in PFIQ-7 score in order to show functional improvement in urinary incontinence.   Baseline: 24 Goal status: INITIAL  5.  Patient will report 0/10 pain in abdominal scar Baseline:  Goal status: INITIAL  6.  Patient will demonstrate improved abdominal strength and  bilateral hip strength to  4/5 at least.              Baseline:  Goal status: INITIAL  PLAN:  PT FREQUENCY: 1-2x/week  PT DURATION: 6 months  PLANNED INTERVENTIONS: 97110-Therapeutic exercises, 97530- Therapeutic activity, 97112- Neuromuscular re-education, 97535- Self Care, [id number]- Manual therapy, [phone]- Electrical stimulation (manual), [phone] (1-2 muscles), [phone] (3+ muscles)- Dry Needling, Patient/Family education, Taping, Joint mobilization, Joint manipulation, Spinal manipulation, Spinal mobilization, Manual lymph drainage, Scar mobilization, Cryotherapy, Moist heat, and Biofeedback  PLAN FOR NEXT SESSION: internal muscle assessment, education on dilators, start strengthening and down training   Trapper Meech, PT 10/11/2023, 4:45 PM

## 2023-10-24 ENCOUNTER — Encounter: Payer: Self-pay | Admitting: Obstetrics

## 2023-10-24 ENCOUNTER — Ambulatory Visit: Admitting: Obstetrics

## 2023-10-24 ENCOUNTER — Ambulatory Visit: Payer: Self-pay | Admitting: Obstetrics

## 2023-10-24 ENCOUNTER — Other Ambulatory Visit: Payer: Self-pay | Admitting: Obstetrics

## 2023-10-24 VITALS — BP 119/85 | HR 90 | Ht 67.0 in | Wt 166.2 lb

## 2023-10-24 DIAGNOSIS — R102 Pelvic and perineal pain: Secondary | ICD-10-CM

## 2023-10-24 DIAGNOSIS — R351 Nocturia: Secondary | ICD-10-CM | POA: Diagnosis not present

## 2023-10-24 DIAGNOSIS — R3914 Feeling of incomplete bladder emptying: Secondary | ICD-10-CM

## 2023-10-24 DIAGNOSIS — R8290 Unspecified abnormal findings in urine: Secondary | ICD-10-CM | POA: Diagnosis not present

## 2023-10-24 DIAGNOSIS — N390 Urinary tract infection, site not specified: Secondary | ICD-10-CM | POA: Diagnosis not present

## 2023-10-24 DIAGNOSIS — N8110 Cystocele, unspecified: Secondary | ICD-10-CM | POA: Diagnosis not present

## 2023-10-24 DIAGNOSIS — N3946 Mixed incontinence: Secondary | ICD-10-CM | POA: Diagnosis not present

## 2023-10-24 DIAGNOSIS — N941 Unspecified dyspareunia: Secondary | ICD-10-CM | POA: Insufficient documentation

## 2023-10-24 DIAGNOSIS — N94819 Vulvodynia, unspecified: Secondary | ICD-10-CM

## 2023-10-24 DIAGNOSIS — M6281 Muscle weakness (generalized): Secondary | ICD-10-CM | POA: Diagnosis not present

## 2023-10-24 LAB — POCT URINALYSIS DIP (CLINITEK)
Bilirubin, UA: NEGATIVE
Glucose, UA: NEGATIVE mg/dL
Glucose, UA: NEGATIVE mg/dL
Ketones, POC UA: NEGATIVE mg/dL
Ketones, POC UA: NEGATIVE mg/dL
Leukocytes, UA: NEGATIVE
Nitrite, UA: NEGATIVE
Nitrite, UA: NEGATIVE
POC PROTEIN,UA: 30 — AB
POC PROTEIN,UA: 30 — AB
Spec Grav, UA: 1.025 (ref 1.010–1.025)
Spec Grav, UA: 1.025 (ref 1.010–1.025)
Urobilinogen, UA: 0.2 U/dL
Urobilinogen, UA: 0.2 U/dL
pH, UA: 6 (ref 5.0–8.0)
pH, UA: 5.5 (ref 5.0–8.0)

## 2023-10-24 LAB — URINALYSIS, COMPLETE (UACMP) WITH MICROSCOPIC
Bilirubin Urine: NEGATIVE
Glucose, UA: NEGATIVE mg/dL
Hgb urine dipstick: NEGATIVE
Ketones, ur: NEGATIVE mg/dL
Leukocytes,Ua: NEGATIVE
Nitrite: NEGATIVE
Protein, ur: 30 mg/dL — AB
Specific Gravity, Urine: 1.028 (ref 1.005–1.030)
pH: 5 (ref 5.0–8.0)

## 2023-10-24 MED ORDER — LIDOCAINE 5 % EX OINT: TOPICAL_OINTMENT | CUTANEOUS | 2 refills | Status: AC

## 2023-10-24 MED ORDER — DICLOFENAC SODIUM 1 % EX GEL
2.0000 g | Freq: Four times a day (QID) | CUTANEOUS | 1 refills | Status: AC
Start: 2023-10-24 — End: ?

## 2023-10-24 NOTE — Assessment & Plan Note (Addendum)
-   POCT UA + leuk/heme, catheterized UA + heme only. Pending UA microscopy - denies urology workup - history of UTI 1x/year since teenage years

## 2023-10-24 NOTE — Progress Notes (Signed)
 New Patient Evaluation and Consultation  Referring Provider: Herchel Gloris LABOR, MD PCP: Program, [facility] Date of Service: 10/24/2023  SUBJECTIVE Chief Complaint: New Patient (Initial Visit) (Joan Kim is a 23 y.o. female here today for female organ prolapse.)  History of Present Illness: Joan Kim is a 23 y.o. White or Caucasian female seen in consultation at the request of Dr Herchel for evaluation of mixed urinary incontinence, first degree uterine prolapse and sensation of incomplete bladder emptying.    Reports constipation with blood in stool since 2021, evaluated by Dr. Avram with EGD for dyspepsia. Recommended fiber supplementation. Reports around 10-15g of dietary fiber/day History of recurrent UTIs 1x/year since childhood, denies workup.  Underwent cesarean section 05/20/23 by Dr. Kandis complicated PPH with [facility] placement. Referred to pelvic floor PT, evaluated on 10/11/23.  Recent urine testing: - 08/02/23 UA + leuk/heme, culture >100K E. Coli resistant to ampicillin . Rx macrobid  - 07/01/23 ED visit for left flank pain, UA + leuk/protein/ketones. Rx norco and zofran .  - 06/03/23 ED evaluation for fever, chills, dysuria, suprapubic discomfort. Urine culture >100K E. Coli resistant to ampicillin  and intermediate to augmentin. Admitted for pyelonephritis and started IV antibiotics. Discharged with Keflex   Reports dyspareunia since she became sexually active Denies breastfeeding Reports history of back pain prior to pregnancy Prior GI evaluation due to dyspepsia, N/V, and epigastric pain. Started protonix , s/p H. + Pylori Ab test, HIDA scan. Recommended Benefiber and PPI. EGD 07/09/19 with reflux esophagitis.  Review of records significant for: H/o preeclampsia, GAD, anemia, Hashimoto's disease, A1GDM  CT Renal Stone Study Result Date: 07/01/2023 CLINICAL DATA:  Flank pain EXAM: CT ABDOMEN AND PELVIS WITHOUT CONTRAST TECHNIQUE: Multidetector CT  imaging of the abdomen and pelvis was performed following the standard protocol without IV contrast. RADIATION DOSE REDUCTION: This exam was performed according to the departmental dose-optimization program which includes automated exposure control, adjustment of the mA and/or kV according to patient size and/or use of iterative reconstruction technique. COMPARISON:  05/06/2023 FINDINGS: Lower chest: No acute abnormality. Hepatobiliary: No focal liver abnormality is seen. No gallstones, gallbladder wall thickening, or biliary dilatation. Pancreas: Unremarkable. No pancreatic ductal dilatation or surrounding inflammatory changes. Spleen: Normal in size without focal abnormality. Adrenals/Urinary Tract: Adrenal glands are within normal limits. Kidneys appear within normal limits. No calculi or obstructive changes are noted. Bladder is decompressed. Stomach/Bowel: Appendix is within normal limits. No obstructive or inflammatory changes of the colon are seen. Small bowel stomach are unremarkable. Vascular/Lymphatic: No significant vascular findings are present. No enlarged abdominal or pelvic lymph nodes. Reproductive: Uterus and bilateral adnexa are unremarkable. Other: No abdominal wall hernia or abnormality. No abdominopelvic ascites. Musculoskeletal: No acute or significant osseous findings. IMPRESSION: No acute abnormality noted. Electronically Signed   By: Oneil Devonshire M.D.   On: 07/01/2023 22:57  CT ABDOMEN PELVIS W CONTRAST Result Date: 06/03/2023 CLINICAL DATA:  Fever, back pain, chills and shortness of breath. EXAM: CT ABDOMEN AND PELVIS WITH CONTRAST TECHNIQUE: Multidetector CT imaging of the abdomen and pelvis was performed using the standard protocol following bolus administration of intravenous contrast. RADIATION DOSE REDUCTION: This exam was performed according to the departmental dose-optimization program which includes automated exposure control, adjustment of the mA and/or kV according to patient  size and/or use of iterative reconstruction technique. CONTRAST:  OMNIPAQUE  IOHEXOL  300 MG/ML  SOLN COMPARISON:  December 28, 2018 FINDINGS: Lower chest: No acute abnormality. Hepatobiliary: No focal liver abnormality is seen. No gallstones, gallbladder wall thickening,  or biliary dilatation. Pancreas: Unremarkable. No pancreatic ductal dilatation or surrounding inflammatory changes. Spleen: Normal in size without focal abnormality. Adrenals/Urinary Tract: Adrenal glands are unremarkable. Kidneys are normal in size, without renal calculi or hydronephrosis. Moderate to marked severity right-sided hydroureter is seen involving the proximal and mid portions of the right ureter. Very mild right-sided periureteral inflammatory fat stranding is noted. An ill-defined, approximately 8 mm area of heterogeneous low attenuation is seen within the mid right kidney. This represents a new finding. The urinary bladder is poorly distended. Moderate severity inflammatory fat stranding is seen surrounding the urinary bladder. Stomach/Bowel: Stomach is within normal limits. The appendix is not clearly identified. No evidence of bowel wall thickening, distention, or inflammatory changes. Vascular/Lymphatic: No significant vascular findings are present. No enlarged abdominal or pelvic lymph nodes. Reproductive: Uterus and bilateral adnexa are unremarkable. Other: No abdominal wall hernia or abnormality. No abdominopelvic ascites. Musculoskeletal: No acute or significant osseous findings. IMPRESSION: Findings consistent with moderate to marked severity right-sided pyelonephritis and cystitis. Electronically Signed   By: Suzen Dials M.D.   On: 06/03/2023 23:55   Urinary Symptoms: Leaks urine with cough/ sneeze, laughing, exercise, going from sitting to standing, with movement to the bathroom, and with urgency Leaks 2-4 time(s) per days with coughing/sneezing Leaks x 1 with urgency Pad use: 1 liners/ mini-pads per day.    Patient is bothered by UI symptoms.  Day time voids 6-8.  Nocturia: 2 times per night to void. Takes a sip of water  at bedtime Denies leg swelling or sleep apnea Reports snoring  Voiding dysfunction:  does not empty bladder well.  Patient does not use a catheter to empty bladder.  When urinating, patient feels dribbling after finishing and the need to urinate multiple times in a row Drinks: 64oz water  per day with ice, 12oz of soda  UTIs: 2 UTI's in the last year.   Denies history of blood in urine, kidney or bladder stones, bladder cancer, and kidney cancer No results found for the last 90 days.   Pelvic Organ Prolapse Symptoms:                  Patient Admits to a feeling of a bulge in the vaginal area due to sensation of pressure after void. It has been present for 5 months.  Patient Denies seeing a bulge.  This bulge is bothersome.  Bowel Symptom: Bowel movements: 3 time(s) per week with pain Stool consistency: hard Type III-IV bristol stool scale Straining: yes, occurs with urination Splinting: no.  Incomplete evacuation: yes.  Patient Denies accidental bowel leakage / fecal incontinence Bowel regimen: diet Last colonoscopy: Has not started age based screening HM Colonoscopy   This patient has no relevant Health Maintenance data.     Sexual Function Sexually active: yes.  Sexual orientation: Straight Pain with sex: Yes, at the vaginal opening, deep in the pelvis, has discomfort due to prolapse Uses lubrication on condoms or position changes without relief  Pelvic Pain Admits to pelvic pain only during intercourse Location: vaginal opening, deep in pelvis  Pain occurs: after sex and exercise Prior pain treatment: heating pad over suprapubic region Improved by: heat Worsened by: intercourse   Past Medical History:  Past Medical History:  Diagnosis Date   Acute pyelonephritis 06/04/2023   Asthma    Bacterial vaginitis    Dyspepsia 08/30/2019   Fatigue  08/12/2020   GERD (gastroesophageal reflux disease)    Gestational diabetes mellitus (GDM) affecting pregnancy, antepartum 03/11/2023  Normal 2 hr postpartum GTT   H/O bladder infections    Hashimoto's thyroiditis 08/30/2019   Helicobacter pylori antibody positive 03/2019   Insomnia 09/18/2020   Migraine without aura and without status migrainosus, not intractable 04/23/2020   Mild preeclampsia 05/18/2023   Nausea/vomiting in pregnancy 10/22/2022   Near syncope 11/03/2022     Past Surgical History:   Past Surgical History:  Procedure Laterality Date   CESAREAN SECTION N/A 05/20/2023   Procedure: CESAREAN SECTION;  Surgeon: Kandis Devaughn Sayres, MD;  Location: [facility] LD ORS;  Service: Obstetrics;  Laterality: N/A;   WISDOM TOOTH EXTRACTION       Past OB/GYN History: OB History  Gravida Para Term Preterm AB Living  2 1 1  0 1 1  SAB IAB Ectopic Multiple Live Births  1 0 0 0 1    # Outcome Date GA Lbr Len/2nd Weight Sex Type Anes PTL Lv  2 Term 05/20/23 [redacted]w[redacted]d 30:53 / 07:40 8 lb 5.3 oz (3.78 kg) F CS-LTranv EPI  LIV  1 SAB 07/2022            Vaginal deliveries: 0,  Forceps/ Vacuum deliveries: 0, Cesarean section: 1 Menopausal: No, LMP No LMP recorded.09/30/23 Contraception: none. Last pap smear.  Any history of abnormal pap smears: no.    Component Value Date/Time   DIAGPAP  11/18/2022 0844    - Negative for intraepithelial lesion or malignancy (NILM)   ADEQPAP Satisfactory for evaluation.  The absence of an 11/18/2022 0844   ADEQPAP  11/18/2022 0844    endocervical/transformation zone component is not uncommon in pregnant   ADEQPAP patients. 11/18/2022 0844    Medications: Patient has a current medication list which includes the following prescription(s): acetaminophen , buspirone , diclofenac  sodium, lidocaine , sertraline , ventolin  hfa, and nitrofurantoin  (macrocrystal-monohydrate).   Allergies: Patient is allergic to latex.   Social History:  Social History   Tobacco Use    Smoking status: Never    Passive exposure: Yes   Smokeless tobacco: Never  Vaping Use   Vaping status: Never Used  Substance Use Topics   Alcohol use: Not Currently   Drug use: Never    Relationship status: married Patient lives with her husband and daughter.   Patient is not employed . Regular exercise: Yes: walking History of abuse: No  Family History:   Family History  Problem Relation Age of Onset   Hypertension Maternal Grandmother    Diabetes Paternal Grandmother    Hypertension Paternal Grandfather    Heart disease Paternal Grandfather    Bladder Cancer Paternal Grandfather    Renal cancer Paternal Grandfather    Emphysema Maternal Uncle    Irritable bowel syndrome Other    Colon cancer Neg Hx    Esophageal cancer Neg Hx    Rectal cancer Neg Hx    Stomach cancer Neg Hx    Uterine cancer Neg Hx      Review of Systems: Review of Systems  Constitutional:  Negative for fever, malaise/fatigue and weight loss.  Respiratory:  Negative for cough, shortness of breath and wheezing.   Cardiovascular:  Negative for chest pain, palpitations and leg swelling.  Gastrointestinal:  Positive for abdominal pain and constipation. Negative for blood in stool.  Genitourinary:  Positive for frequency and urgency. Negative for dysuria and hematuria.       Leakage  Skin:  Negative for rash.  Neurological:  Positive for headaches. Negative for dizziness and weakness.  Endo/Heme/Allergies:  Does not bruise/bleed easily.  Psychiatric/Behavioral:  Negative for  depression. The patient is not nervous/anxious.      OBJECTIVE Physical Exam: Vitals:   10/24/23 1000 10/24/23 1009  BP: 119/85   Pulse: 90   Weight:  166 lb 3.2 oz (75.4 kg)  Height:  5' 7 (1.702 m)    Physical Exam Constitutional:      General: She is not in acute distress.    Appearance: Normal appearance.  Genitourinary:     Bladder and urethral meatus normal.     No lesions in the vagina.     Right Labia: No  rash, tenderness, lesions, skin changes or Bartholin's cyst.    Left Labia: No tenderness, lesions, skin changes, Bartholin's cyst or rash.       No vaginal discharge, erythema, tenderness, bleeding, ulceration or granulation tissue.     No vaginal prolapse present.    No vaginal atrophy present.     Right Adnexa: not tender, not full and no mass present.    Left Adnexa: not tender, not full and no mass present.    No cervical motion tenderness, discharge, friability, lesion, polyp or nabothian cyst.     Uterus is not enlarged, fixed, tender, irregular or prolapsed.     No uterine mass detected.    Urethral meatus caruncle not present.    No urethral prolapse, tenderness, mass, hypermobility, discharge or stress urinary incontinence with cough stress test present.     Bladder is not tender, urgency on palpation not present and masses not present.      Pelvic Floor: Levator muscle strength is 3/5.    Levator ani is tender and obturator internus is tender.     No asymmetrical contractions present and no pelvic spasms present.    Symmetrical pelvic sensation, anal wink present and BC reflex present. Cardiovascular:     Rate and Rhythm: Normal rate.  Pulmonary:     Effort: Pulmonary effort is normal. No respiratory distress.  Abdominal:     General: There is no distension.     Palpations: Abdomen is soft. There is no mass.     Tenderness: There is abdominal tenderness. There is no right CVA tenderness or left CVA tenderness.     Hernia: No hernia is present.   Musculoskeletal:       Legs:  Neurological:     Mental Status: She is alert.  Vitals reviewed. Exam conducted with a chaperone present.      POP-Q:   POP-Q  -3                                            Aa   -3                                           Ba  -6                                              C   1  Gh  2                                            Pb  9                                             tvl   -3                                            Ap  -3                                            Bp  -8                                              D    Post-Void Residual (PVR) by Bladder Scan: In order to evaluate bladder emptying, we discussed obtaining a postvoid residual and patient agreed to this procedure.  Procedure: The ultrasound unit was placed on the patient's abdomen in the suprapubic region after the patient had voided.    Post Void Residual - 10/24/23 1016       Post Void Residual   Post Void Residual 4 mL         Straight Catheterization Procedure for PVR: After verbal consent was obtained from the patient for catheterization to assess bladder emptying and residual volume the urethra and surrounding tissues were prepped with betadine and an in and out catheterization was performed.  PVR was 10mL.  Urine appeared clear yellow. The patient tolerated the procedure well.  Repeat UA + heme only from catheterized sample  Laboratory Results: Lab Results  Component Value Date   COLORU yellow 10/24/2023   CLARITYU clear 10/24/2023   GLUCOSEUR negative 10/24/2023   BILIRUBINUR small (A) 10/24/2023   SPECGRAV 1.025 10/24/2023   RBCUR trace-lysed (A) 10/24/2023   PHUR 6.0 10/24/2023   PROTEINUR 30 (A) 07/01/2023   UROBILINOGEN 0.2 10/24/2023   LEUKOCYTESUR Small (1+) (A) 10/24/2023    Lab Results  Component Value Date   CREATININE 0.71 07/01/2023   CREATININE 0.71 06/04/2023   CREATININE 0.73 06/03/2023    Lab Results  Component Value Date   HGBA1C 4.6 06/11/2020    Lab Results  Component Value Date   HGB 10.8 (L) 07/01/2023     ASSESSMENT AND PLAN Ms. Fariss is a 23 y.o. with:  1. Feeling of incomplete bladder emptying   2. Urinary incontinence, mixed   3. Dyspareunia in female   4. Nocturia   5. Pelvic pain   6. Abnormal urinalysis   7. Vulvodynia   8. Frequent UTI   9. Pelvic organ prolapse  quantification stage 1 cystocele     Feeling of incomplete bladder emptying Assessment & Plan: - PVR 4mL, catheterized for 10mL - likely due to pelvic floor myofascial pain - repeat PVR with clinical change  Orders: -  POCT URINALYSIS DIP (CLINITEK)  Urinary incontinence, mixed Assessment & Plan: - POCT UA + leuk/heme/bili, pending UA microscopy - stress > urgency - For treatment of stress urinary incontinence,  non-surgical options include expectant management, weight loss, physical therapy, as well as a pessary.  Surgical options include a midurethral sling, Burch urethropexy, and transurethral injection of a bulking agent. - continue pelvic floor PT, optimize stool consistency - We discussed the symptoms of overactive bladder (OAB), which include urinary urgency, urinary frequency, nocturia, with or without urge incontinence.  While we do not know the exact etiology of OAB, several treatment options exist. We discussed management including behavioral therapy (decreasing bladder irritants, urge suppression strategies, timed voids, bladder retraining), physical therapy, medication; for refractory cases posterior tibial nerve stimulation, sacral neuromodulation, and intravesical botulinum toxin injection.  For anticholinergic medications, we discussed the potential side effects of anticholinergics including dry eyes, dry mouth, constipation, cognitive impairment and urinary retention. For Beta-3 agonist medication, we discussed the potential side effect of elevated blood pressure which is more likely to occur in individuals with uncontrolled hypertension. 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  We also discussed using pyridium and similar over-the-counter medications for  pain relief as needed.   Orders: -     POCT URINALYSIS DIP (CLINITEK)  Dyspareunia in female Assessment & Plan: - pain since prior to pregnancy - pelvic girdle pain, bilateral ilioinguinal and pelvic floor myofascial pain on exam bilaterally with vulvodynia - The origin of pelvic floor muscle spasm can be multifactorial, including primary, reactive to a different pain source, trauma, or even part of a centralized pain syndrome.Treatment options include pelvic floor physical therapy, local (vaginal) or oral  muscle relaxants, pelvic muscle trigger point injections or centrally acting pain medications.   - continue pelvic floor PT  - encouraged position change, lubrication use and topical lidocaine  prior to intercourse   Nocturia Assessment & Plan: - avoid fluid intake 3 hours before bedtime - due to snoring, consider sleep study to r/o sleep apnea if refractory after fluid management and pelvic floor PT - consider anti-histamines   Pelvic pain Assessment & Plan: - pelvic girdle pain and ilioinguinal nerve irritation bilaterally at the apex of Pfannenstiel incision bilaterally - Rx Voltaren  gel PRN pain up to 4x/day due to avoidance of NSAIDs with history of reflux esophagitis - encouraged heat/cold compress, stretching, and pelvic floor PT for pain relief - encouraged pelvic binder for SI joint pain  Orders: -     Diclofenac  Sodium; Apply 2 g topically 4 (four) times daily.  Dispense: 100 g; Refill: 1  Abnormal urinalysis Assessment & Plan: - POCT UA + leuk/heme, catheterized UA + heme only. Pending UA microscopy - denies urology workup - history of UTI 1x/year since teenage years  Orders: -     Urine Microscopic; Future  Vulvodynia Assessment & Plan: - reviewed comfort measures and treatment options.  - Rx topical lidocaine  1g PRN pain up to 3x/day - consider Rx Amitriptyline 2.5%/ gabapentin 2.5%/ baclofen 2.5% in vaginal cream to compounding pharmacy if refractory -  continue pelvic floor PT - discussed conservative management options with cold compress after intercourse  - lubrication use during intercourse  Orders: -     Lidocaine ; Use 0.5g (peasize) at vaginal opening around 10-14min prior to intercourse  Dispense: 35.44  g; Refill: 2  Frequent UTI Assessment & Plan: - pending UA microscopy - 06/03/23 ED evaluation for fever, chills, dysuria, suprapubic discomfort. Urine culture >100K E. Coli resistant to ampicillin  and intermediate to augmentin. Admitted for pyelonephritis and started IV antibiotics. Discharged with Keflex   - For treatment of recurrent urinary tract infections, we discussed management of recurrent UTIs including prophylaxis with a daily low dose antibiotic, transvaginal estrogen therapy, D-mannose, and cranberry supplements.  We discussed the role of diagnostic testing such as cystoscopy and upper tract imaging.      Pelvic organ prolapse quantification stage 1 cystocele Assessment & Plan: - asymptomatic - For treatment of pelvic organ prolapse, we discussed options for management including expectant management, conservative management, and surgical management, such as Kegels, a pessary, pelvic floor physical therapy, and specific surgical procedures. - continue pelvic floor PT   Time spent: I spent 60 minutes dedicated to the care of this patient on the date of this encounter to include pre-visit review of records, face-to-face time with the patient discussing mixed urinary incontinence, nocturia, frequent UTI, vulvodynia, stage I pelvic organ prolapse, pelvic pain, dyspareunia, and post visit documentation and ordering medication/ testing.    Lianne ONEIDA Gillis, MD

## 2023-10-24 NOTE — Patient Instructions (Addendum)
 For treatment of stress urinary incontinence, which is leakage with physical activity/movement/strainging/coughing, we discussed expectant management versus nonsurgical options versus surgery. Nonsurgical options include weight loss, physical therapy, as well as a pessary.  Surgical options include a midurethral sling, which is a synthetic mesh sling that acts like a hammock under the urethra to prevent leakage of urine, a Burch urethropexy, and transurethral injection of a bulking agent.   Constipation: Our goal is to achieve formed bowel movements daily or every-other-day.  You may need to try different combinations of the following options to find what works best for you - everybody's body works differently so feel free to adjust the dosages as needed.  Some options to help maintain bowel health include:  Dietary changes (more leafy greens, vegetables and fruits; less processed foods) Fiber supplementation (Benefiber, FiberCon, Metamucil or Psyllium). Start slow and increase gradually to full dose. Over-the-counter agents such as: stool softeners (Docusate or Colace) and/or laxatives (Miralax , milk of magnesia)  Power Pudding is a natural mixture that may help your constipation.  To make blend 1 cup applesauce, 1 cup wheat bran, and 3/4 cup prune juice, refrigerate and then take 1 tablespoon daily with a large glass of water  as needed.   Women should try to eat at least 21 to 25 grams of fiber a day, while men should aim for 30 to 38 grams a day. You can add fiber to your diet with food or a fiber supplement such as psyllium (metamucil), benefiber, or fibercon.   Here's a look at how much dietary fiber is found in some common foods. When buying packaged foods, check the Nutrition Facts label for fiber content. It can vary among brands.  Fruits Serving size Total fiber (grams)*  Raspberries 1 cup 8.0  Pear 1 medium 5.5  Apple, with skin 1 medium 4.5  Banana 1 medium 3.0  Orange 1 medium 3.0   Strawberries 1 cup 3.0   Vegetables Serving size Total fiber (grams)*  Green peas, boiled 1 cup 9.0  Broccoli, boiled 1 cup chopped 5.0  Turnip greens, boiled 1 cup 5.0  Brussels sprouts, boiled 1 cup 4.0  Potato, with skin, baked 1 medium 4.0  Sweet corn, boiled 1 cup 3.5  Cauliflower, raw 1 cup chopped 2.0  Carrot, raw 1 medium 1.5   Grains Serving size Total fiber (grams)*  Spaghetti, whole-wheat, cooked 1 cup 6.0  Barley, pearled, cooked 1 cup 6.0  Bran flakes 3/4 cup 5.5  Quinoa, cooked 1 cup 5.0  Oat bran muffin 1 medium 5.0  Oatmeal, instant, cooked 1 cup 5.0  Popcorn, air-popped 3 cups 3.5  Brown rice, cooked 1 cup 3.5  Bread, whole-wheat 1 slice 2.0  Bread, rye 1 slice 2.0   Legumes, nuts and seeds Serving size Total fiber (grams)*  Split peas, boiled 1 cup 16.0  Lentils, boiled 1 cup 15.5  Black beans, boiled 1 cup 15.0  Baked beans, canned 1 cup 10.0  Chia seeds 1 ounce 10.0  Almonds 1 ounce (23 nuts) 3.5  Pistachios 1 ounce (49 nuts) 3.0  Sunflower kernels 1 ounce 3.0  *Rounded to nearest 0.5 gram. Source: [employer] for Standard Reference, Legacy Release    The origin of pelvic floor muscle spasm can be multifactorial, including primary, reactive to a different pain source, trauma, or even part of a centralized pain syndrome.Treatment options include pelvic floor physical therapy, local (vaginal) or oral  muscle relaxants, pelvic muscle trigger point injections or centrally acting pain  medications.     For night time frequency: - avoid fluid intake 3 hours before bedtime

## 2023-10-24 NOTE — Assessment & Plan Note (Signed)
-   PVR 4mL, catheterized for 10mL - likely due to pelvic floor myofascial pain - repeat PVR with clinical change

## 2023-10-24 NOTE — Assessment & Plan Note (Addendum)
-   POCT UA + leuk/heme/bili, pending UA microscopy - stress > urgency - For treatment of stress urinary incontinence,  non-surgical options include expectant management, weight loss, physical therapy, as well as a pessary.  Surgical options include a midurethral sling, Burch urethropexy, and transurethral injection of a bulking agent. - continue pelvic floor PT, optimize stool consistency - We discussed the symptoms of overactive bladder (OAB), which include urinary urgency, urinary frequency, nocturia, with or without urge incontinence.  While we do not know the exact etiology of OAB, several treatment options exist. We discussed management including behavioral therapy (decreasing bladder irritants, urge suppression strategies, timed voids, bladder retraining), physical therapy, medication; for refractory cases posterior tibial nerve stimulation, sacral neuromodulation, and intravesical botulinum toxin injection.  For anticholinergic medications, we discussed the potential side effects of anticholinergics including dry eyes, dry mouth, constipation, cognitive impairment and urinary retention. For Beta-3 agonist medication, we discussed the potential side effect of elevated blood pressure which is more likely to occur in individuals with uncontrolled hypertension. 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  We also discussed using pyridium and similar over-the-counter medications for pain relief as needed.

## 2023-10-24 NOTE — Assessment & Plan Note (Signed)
-   pending UA microscopy - 06/03/23 ED evaluation for fever, chills, dysuria, suprapubic discomfort. Urine culture >100K E. Coli resistant to ampicillin  and intermediate to augmentin. Admitted for pyelonephritis and started IV antibiotics. Discharged with Keflex   - For treatment of recurrent urinary tract infections, we discussed management of recurrent UTIs including prophylaxis with a daily low dose antibiotic, transvaginal estrogen therapy, D-mannose, and cranberry supplements.  We discussed the role of diagnostic testing such as cystoscopy and upper tract imaging.

## 2023-10-24 NOTE — Assessment & Plan Note (Signed)
-   reviewed comfort measures and treatment options.  - Rx topical lidocaine  1g PRN pain up to 3x/day - consider Rx Amitriptyline 2.5%/ gabapentin 2.5%/ baclofen 2.5% in vaginal cream to compounding pharmacy if refractory - continue pelvic floor PT - discussed conservative management options with cold compress after intercourse  - lubrication use during intercourse

## 2023-10-24 NOTE — Assessment & Plan Note (Signed)
-   pelvic girdle pain and ilioinguinal nerve irritation bilaterally at the apex of Pfannenstiel incision bilaterally - Rx Voltaren  gel PRN pain up to 4x/day due to avoidance of NSAIDs with history of reflux esophagitis - encouraged heat/cold compress, stretching, and pelvic floor PT for pain relief - encouraged pelvic binder for SI joint pain

## 2023-10-24 NOTE — Assessment & Plan Note (Signed)
-   asymptomatic - For treatment of pelvic organ prolapse, we discussed options for management including expectant management, conservative management, and surgical management, such as Kegels, a pessary, pelvic floor physical therapy, and specific surgical procedures. - continue pelvic floor PT

## 2023-10-24 NOTE — Assessment & Plan Note (Signed)
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   - continue pelvic floor PT  - encouraged position change, lubrication use and topical lidocaine  prior to intercourse

## 2023-10-24 NOTE — Addendum Note (Signed)
 Addended by: KRYSTAL ANDREE GAILS on: 10/24/2023 01:10 PM   Modules accepted: Orders

## 2023-10-24 NOTE — Assessment & Plan Note (Addendum)
-   avoid fluid intake 3 hours before bedtime - due to snoring, consider sleep study to r/o sleep apnea if refractory after fluid management and pelvic floor PT - consider anti-histamines

## 2023-10-30 ENCOUNTER — Other Ambulatory Visit: Payer: Self-pay | Admitting: Family Medicine

## 2023-10-31 ENCOUNTER — Other Ambulatory Visit: Payer: Self-pay

## 2023-10-31 MED ORDER — BUSPIRONE HCL 5 MG PO TABS: 5.0000 mg | ORAL_TABLET | Freq: Three times a day (TID) | ORAL | 0 refills | Status: DC

## 2023-10-31 NOTE — Telephone Encounter (Signed)
 Chart reviewed. Temporary rx bridge refilled until new PCP appt on 11/09/23.

## 2023-10-31 NOTE — Telephone Encounter (Signed)
 Patient calls nurse line requesting a refill on Buspar .   She reports she takes 5mg  TID PRN.   Patient scheduled with new PCP for 8/13.   Patient is requesting a partial refill to get her to this apt.   Advised will forward to PCP.

## 2023-11-09 ENCOUNTER — Ambulatory Visit: Admitting: Family Medicine

## 2023-11-09 ENCOUNTER — Other Ambulatory Visit (HOSPITAL_COMMUNITY)
Admission: RE | Admit: 2023-11-09 | Discharge: 2023-11-09 | Disposition: A | Discharge status: Home / Self Care | Source: Ambulatory Visit | Attending: Family Medicine | Admitting: Family Medicine

## 2023-11-09 ENCOUNTER — Encounter: Payer: Self-pay | Admitting: Family Medicine

## 2023-11-09 VITALS — BP 110/62 | HR 106 | Ht 67.0 in | Wt 165.6 lb

## 2023-11-09 DIAGNOSIS — N898 Other specified noninflammatory disorders of vagina: Secondary | ICD-10-CM | POA: Diagnosis not present

## 2023-11-09 DIAGNOSIS — F419 Anxiety disorder, unspecified: Secondary | ICD-10-CM | POA: Diagnosis present

## 2023-11-09 DIAGNOSIS — F32A Depression, unspecified: Secondary | ICD-10-CM

## 2023-11-09 DIAGNOSIS — R350 Frequency of micturition: Secondary | ICD-10-CM

## 2023-11-09 DIAGNOSIS — E611 Iron deficiency: Secondary | ICD-10-CM

## 2023-11-09 LAB — POCT UA - MICROSCOPIC ONLY
Epithelial cells, urine per micros: >20
WBC, Ur, HPF, POC: >20 (ref 0–5)

## 2023-11-09 LAB — POCT URINALYSIS DIP (MANUAL ENTRY)
Glucose, UA: NEGATIVE mg/dL
Nitrite, UA: NEGATIVE
Spec Grav, UA: 1.025 (ref 1.010–1.025)
Urobilinogen, UA: 0.2 U/dL
pH, UA: 6 (ref 5.0–8.0)

## 2023-11-09 MED ORDER — PROPRANOLOL HCL 10 MG PO TABS: 10.0000 mg | ORAL_TABLET | ORAL | 0 refills | Status: AC | PRN

## 2023-11-09 MED ORDER — ALBUTEROL SULFATE HFA 108 (90 BASE) MCG/ACT IN AERS: 2.0000 | INHALATION_SPRAY | RESPIRATORY_TRACT | 3 refills | Status: AC | PRN

## 2023-11-09 NOTE — Assessment & Plan Note (Signed)
 Stable PHQ-9 today of 4. No SI/HI.  - Discussed Propanolol as needed prior to inciting events, discussed possible side effects including dizziness, lightheadedness, low BP and practicing caution while using medication  -Provided therapy resources in AVS

## 2023-11-09 NOTE — Assessment & Plan Note (Addendum)
 While present today, patient would like to check iron  levels. Has not been taking supplements recently. Last ferritin of 8 in Jan 2025 during pregnancy. Anemia during pregnancy with Hgb improvement to 10.8 postpartum.  -Ferritin ordered

## 2023-11-09 NOTE — Progress Notes (Addendum)
    SUBJECTIVE:   CHIEF COMPLAINT / HPI:   Anxiety  -Previously given buspar , would like a prn medication only   -Wants to only take prn around 3-4 times a year prior to specific anxiety producing events, prefers non-drowsy option -Stopped sertraline  February, didn't like it, doesn't want long term med  -Overall feels mood has been better since delivery  -Not sure about therapy at this time  -No SI/HI  Discharge  Urinary frequency -Clear, sticky, odorous vaginal discharge started after delivery - has remained ever since  -Follows witrh urogyn and obgyn  -No pain at baseline, given cream for intercourse pain  -No dysuria, no gross hematuria  -Increased frequency & urge for years   PERTINENT  PMH / PSH: Pelvic prolapse, mixed urinary incontinence, pelvic pain, dyspareunia, vulvodynia, frequent UTI  OBJECTIVE:   BP 110/62   Pulse (!) 106   Ht 5' 7 (1.702 m)   Wt 165 lb 9.6 oz (75.1 kg)   SpO2 97%   BMI 25.94 kg/m   General: Well-appearing. Resting comfortably in room. CV: Normal S1/S2. No extra heart sounds. Warm and well-perfused. Pulm: Breathing comfortably on room air. CTAB. No increased WOB. Abd: Soft, non-tender, non-distended. Skin:  Warm, dry. Psych: Pleasant and appropriate.   Flowsheet Row Office Visit from [facility]/09/2023 in [facility] [place]  PHQ-9 Total Score 4      ASSESSMENT/PLAN:   Assessment & Plan Anxiety and depression Stable PHQ-9 today of 4. No SI/HI.  - Discussed Propanolol as needed prior to inciting events, discussed possible side effects including dizziness, lightheadedness, low BP and practicing caution while using medication  -Provided therapy resources in AVS Vaginal discharge Urinary frequency Follows with Urogyn for pelvic organ prolapse, mixed urinary incontinence, pelvic pain, frequent UTI. Chronic vaginal discharge since delivery (Feb 2025). Recent UA unremarkable. Defer pelvic exam  today given known vulvodynia and pelvic pain.  -Self-collect G/C/T/BV testing -HIV, RPR -UA and urine culture  -Cont to follow with Urogyn   Iron  deficiency While present today, patient would like to check iron  levels. Has not been taking supplements recently. Last ferritin of 8 in Jan 2025 during pregnancy. Anemia during pregnancy with Hgb improvement to 10.8 postpartum.  -Ferritin ordered   RTC as needed.   Damien Cassis, MD [facility]

## 2023-11-09 NOTE — Patient Instructions (Addendum)
 Thank you for visiting clinic today and allowing us  to participate in your care!  We are collecting a few test today and will let you know of the results.   Prior to anxiety-producing events, you may take propanolol. This medication may decrease your blood pressure and cause you to feel lightheaded or dizzy so please practice caution while using.   Please see blow for a list of therapy resources if you become interested.   Please schedule an appointment as needed.   Reach out any time with any questions or concerns you may have - we are here for you!  Damien Cassis, MD [facility] [phone]    Therapy and Counseling Resources Most providers on this list will take Medicaid. Patients with commercial insurance or Medicare should contact their insurance company to get a list of in network providers.  [facility] [address] [place], [place] [address] [phone]  [facility]  [address] [place], [place] [address]      [phone]  [facility] [address]  [place], [place] [address] [phone]  [facility] [address], [address]  [place], [place] [address]       [phone]     [facility] (virtual only) [phone]  [facility] [address], [place], [place] [phone]  [facility]:  [address] [place] [phone]  [facility]:  [address] [place] [phone]  [facility] (under & uninsured) [address]   [place] [phone]    [email or web address]    [facility] [address]  [place]    [phone]  [facility] -[address]     Phone:  [phone]     [facility]-  [address]  [phone]   [facility] [address]       [place], [place] [phone] ext [phone]  [facility]: [address] [place], [place], [place]  [phone]   [facility] (Spanish therapist) [email or web address]  [address] [place]  [address]   [place] [address]    [phone]    [facility]   [address],  [place], [place]  [phone]   [facility]  [address] [place]  [phone]  [facility]  [address] [place], [place]        [phone]  Open Access/Walk In Clinic under & uninsured  [facility]  [address] [place], [place] [phone] Crisis [phone]  [facility] [address],  (Spanish)   [address] , [place]: [phone]) 8:30 - 12; 1 - 2:30  [facility] [employer],  [address] [place], [place]    [phone]):8:30 - 12; 2 - 3PM  [facility] [employer],  [address]  [place]; [phone]):   Mon - Fri 8 AM - 5 PM  [facility] [address] [place]  MWF 12:30 to 3:00 or call to schedule an appointment  [phone]  Specific Provider options Psychology Today  [email or web address]/us  click on find a therapist  enter your zip code left side and select or tailor a therapist for your specific need.   [facility] Provider Directory [email or web address]  (Medicaid)   Follow all  drop down to find a provider  Social Support program [facility] [place] [phone] or [employer] [address], [place], [place] Recovery support and educational   24- Hour Availability:   [facility]  [address] [place], [place] [phone] Crisis [phone]  [facility] [phone]  [facility]  [phone]   [facility]  [phone] (after hours)  Therapeutic  Alternative/Mobile Crisis   [phone]  [place]  National Suicide Hotline  661 167 1482 MERRILYN)  Call 911 or go to emergency room  [facility]  [phone]);  [place] and [employer]  [phone]); [place], [place], [place], [place], [place], [place], [place]

## 2023-11-10 ENCOUNTER — Ambulatory Visit: Payer: Self-pay | Admitting: Family Medicine

## 2023-11-10 LAB — HIV ANTIBODY (ROUTINE TESTING W REFLEX): HIV Screen 4th Generation wRfx: NONREACTIVE

## 2023-11-10 LAB — RPR: RPR Ser Ql: NONREACTIVE

## 2023-11-10 LAB — FERRITIN: Ferritin: 11 ng/mL — ABNORMAL LOW (ref 15–150)

## 2023-11-10 MED ORDER — FERROUS SULFATE 325 (65 FE) MG PO TABS: 325.0000 mg | ORAL_TABLET | ORAL | 0 refills | Status: AC

## 2023-11-10 MED ORDER — NITROFURANTOIN MONOHYD MACRO 100 MG PO CAPS: 100.0000 mg | ORAL_CAPSULE | Freq: Two times a day (BID) | ORAL | 0 refills | Status: AC

## 2023-11-11 LAB — CERVICOVAGINAL ANCILLARY ONLY
Bacterial Vaginitis (gardnerella): POSITIVE — AB
Chlamydia: NEGATIVE
Comment: NEGATIVE
Comment: NEGATIVE
Comment: NEGATIVE
Comment: NORMAL
Neisseria Gonorrhea: NEGATIVE
Trichomonas: NEGATIVE

## 2023-11-11 LAB — URINE CULTURE

## 2023-11-11 MED ORDER — METRONIDAZOLE 500 MG PO TABS
500.0000 mg | ORAL_TABLET | Freq: Two times a day (BID) | ORAL | 0 refills | Status: AC
Start: 2023-11-11 — End: ?

## 2023-11-11 NOTE — Addendum Note (Signed)
 Addended by: DIONA PERKINS on: 11/11/2023 09:49 PM   Modules accepted: Orders

## 2023-11-22 ENCOUNTER — Ambulatory Visit: Admitting: Physical Therapy

## 2023-11-30 ENCOUNTER — Ambulatory Visit: Attending: Obstetrics & Gynecology | Admitting: Physical Therapy

## 2023-11-30 ENCOUNTER — Encounter: Payer: Self-pay | Admitting: Physical Therapy

## 2023-11-30 DIAGNOSIS — M6281 Muscle weakness (generalized): Secondary | ICD-10-CM | POA: Diagnosis present

## 2023-11-30 DIAGNOSIS — R293 Abnormal posture: Secondary | ICD-10-CM | POA: Insufficient documentation

## 2023-11-30 NOTE — Therapy (Signed)
 OUTPATIENT PHYSICAL THERAPY FEMALE PELVIC TREATMENT   Patient Name: Joan Kim MRN: [id number] DOB:2000/10/08, 23 y.o., female Today's Date: 11/30/2023  END OF SESSION:  PT End of Session - 11/30/23 1530     Visit Number 2    Date for PT Re-Evaluation 04/12/23    Authorization Type [place] MEDICAID [employer]    PT Start Time 1530    PT Stop Time 1610    PT Time Calculation (min) 40 min    Activity Tolerance Patient tolerated treatment well    Behavior During Therapy [facility] for tasks assessed/performed          Past Medical History:  Diagnosis Date   Acute pyelonephritis 06/04/2023   Asthma    Bacterial vaginitis    Dyspepsia 08/30/2019   Fatigue 08/12/2020   GERD (gastroesophageal reflux disease)    Gestational diabetes mellitus (GDM) affecting pregnancy, antepartum 03/11/2023   Normal 2 hr postpartum GTT   H/O bladder infections    Hashimoto's thyroiditis 08/30/2019   Helicobacter pylori antibody positive 03/2019   Insomnia 09/18/2020   Migraine without aura and without status migrainosus, not intractable 04/23/2020   Mild preeclampsia 05/18/2023   Nausea/vomiting in pregnancy 10/22/2022   Near syncope 11/03/2022   Past Surgical History:  Procedure Laterality Date   CESAREAN SECTION N/A 05/20/2023   Procedure: CESAREAN SECTION;  Surgeon: Kandis Devaughn Sayres, MD;  Location: [facility] LD ORS;  Service: Obstetrics;  Laterality: N/A;   WISDOM TOOTH EXTRACTION     Patient Active Problem List   Diagnosis Date Noted   Feeling of incomplete bladder emptying 10/24/2023   Urinary incontinence, mixed 10/24/2023   Dyspareunia in female 10/24/2023   Nocturia 10/24/2023   Frequent UTI 10/24/2023   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   Pelvic pain 10/24/2023   Abnormal urinalysis 10/24/2023   Pelvic organ prolapse quantification stage 1 cystocele 10/24/2023   Fever 06/04/2023   Leukocytosis 06/04/2023   Hypokalemia 06/04/2023   GAD (generalized anxiety disorder) 06/04/2023    Chronic anemia 06/04/2023   Family history of spina bifida 11/18/2022   Mild intermittent asthma 11/18/2022   Iron  deficiency 12/26/2019   Anxiety and depression 10/04/2019   Hashimoto's thyroiditis 08/30/2019   Vitamin D deficiency 08/30/2019    PCP: Program, [facility]  REFERRING PROVIDER: Herchel Gloris LABOR, MD  REFERRING DIAG: R39.14 (ICD-10-CM) - Feeling of incomplete bladder emptying N81.2 (ICD-10-CM) - First degree uterine prolapse THERAPY DIAG:  Muscle weakness (generalized)  Abnormal posture  Rationale for Evaluation and Treatment: Rehabilitation  ONSET DATE: 04/2023  SUBJECTIVE:  SUBJECTIVE STATEMENT: 11/30/23 I went to the urologist and not to do Kegels.   Patient had  a baby 5 months ago, she got induced, she pushed for 4 hours, in labor for 36 hours, ended up with a C section. Feels fine now, does something and then pees herself. It's not a whole lot, changes underwear. Sometimes she feels like she has to pee, and only a little about it.  Has had PT for her knees.  She hemorrhaged after the C section, she could have died, she had kidney infection and sepsis, foley catheter, a green thing for 2 day. She got weak.  Feels good now, does not like that she is peeing herself.  Has common UTIs, every year.  Speculum always uncomfortable, does not use tampons.  Had really painful periods Fluid intake: water   PAIN:  Are you having pain? Yes scar- abdominal scar NPRS scale: up to 5/10   PRECAUTIONS: None  RED FLAGS: None   WEIGHT BEARING RESTRICTIONS: No  FALLS:  Has patient fallen in last 6 months? 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  ACTIVITY LEVEL : active, window shops, gets out of the house, does not exercise  PLOF: Independent  PATIENT GOALS: stay  dry  PERTINENT HISTORY:  C section 04/2023 Sexual abuse: No  BOWEL MOVEMENT: sometimes constipation- every 2 days or so Pain with bowel movement: Yes a lot of times, has pain with BMs, normal for her, stuck a finger  Type of bowel movement:Type (Bristol Stool Scale) 3-4 Fully empty rectum: No Leakage: No Pads: No Fiber supplement/laxative Yes   URINATION: Pain with urination: No Fully empty bladder: No- does a full pee and has to go back Stream: Strong Urgency: Yes  Frequency: yes Leakage: Urge to void, Coughing, Sneezing, Laughing, Exercise, and Lifting Bowling, Skating Pads: Yes:    INTERCOURSE:  Ability to have vaginal penetration Yes  Pain with intercourse: Initial Penetration, During Penetration, Deep Penetration, and After Intercourse- worse now, always hurt even before baby DrynessNo Climax: no- never had one Marinoff Scale: 1/3 Laxative:  PREGNANCY: Vaginal deliveries 0 Tearing No Episiotomy No C-section deliveries 1 Currently pregnant No  PROLAPSE: None   OBJECTIVE:  Note: Objective measures were completed at Evaluation unless otherwise noted.    PFIQ-7: 24  COGNITION: Overall cognitive status: Within functional limits for tasks assessed     SENSATION: Light touch: Appears intact  LUMBAR SPECIAL TESTS:  Straight leg raise test: Positive for compensation with rotation of abdomen and pelvis      POSTURE: rounded shoulders, forward head, and increased lumbar lordosis   LUMBARAROM/PROM: full   LOWER EXTREMITY ROM: full   LOWER EXTREMITY MMT: weak hips and knees bilateral  4/5  PALPATION:   General: upper chest breathing strategies  Pelvic Alignment: even 11/30/23: left ilium rotated posteriorly, positive compression of the left ilium  Abdominal: tight and hypersensitive abdominal scar                External Perineal Exam: deferred to next visit due to time                             Internal Pelvic Floor: deferred to next visit due  to time  Patient confirms identification and approves PT to assess internal pelvic floor and treatment Yes  PELVIC MMT:   MMT eval  Vaginal   Internal Anal Sphincter   External Anal Sphincter   Puborectalis   Diastasis Recti 2  fingers at and above umbilicus  (Blank rows = not tested)        TONE: deferred to next visit due to time  PROLAPSE: deferred to next visit due to time  TODAY'S TREATMENT:     11/30/23 Manual: Scar tissue mobilization: Manual work to the c-section scar to improve mobility and educated patient on how to mobilize at home Myofascial release: Tissue rolling around the lower rib cage and abdomen Fascial release around the urachus ligament and lower abdomen Spinal mobilization: PA and rotational mobilization to T8-L5 grade 3 PA mobilization to bilateral SI joint Neuromuscular re-education: Core retraining: Supine diaphragmatic breathing and as she breaths out to contract her abdominals with tactile cues to contract the abdominals and not lift the upper rib cage Exercises: Stretches/mobility: Supine piriformis stretch holding 30 sec bil.  Quadruped rock back and forth with hips internally rotated Frog stretch but hurt her inner thigh.  Sitting v stretch holding 30 sec  Happy baby holding 30 sec                                                                                                                              DATE: 10/11/2023  EVAL EVAL Examination completed, findings reviewed, pt educated on POC, HEP, and female pelvic floor anatomy, reasoning with pelvic floor assessment internally with pt consent. Pt motivated to participate in PT and agreeable to attempt recommendations.    Manual- abdominal scar massage and cupping  PATIENT EDUCATION:  11/30/23 Education details:   Access Code: [email or web address]; education on scar massage Person educated: Patient Education method: Explanation, Demonstration, Tactile cues, Verbal cues, and Handouts Education  comprehension: verbalized understanding, returned demonstration, verbal cues required, tactile cues required, and needs further education  HOME EXERCISE PROGRAM: 11/30/23 Access Code: [email or web address] URL: [email or web address] Date: 11/30/2023 Prepared by: [name]  Exercises - Supine Hip Adduction Isometric with Ball  - 1 x daily - 7 x weekly - 2 sets - 10 reps - Standing Shoulder Diagonal Horizontal Abduction 60/120 Degrees with Resistance  - 1 x daily - 7 x weekly - 2 sets - 10 reps - Hooklying Transversus Abdominis Palpation  - 1 x daily - 7 x weekly - 1 sets - 10 reps - Supine Piriformis Stretch  - 1 x daily - 7 x weekly - 1 sets - 1 reps - 30 sec hold - V Sit Hip Adductor Hamstring Stretch  - 1 x daily - 7 x weekly - 1 sets - 1 reps - 30 sec hold - Happy Baby with Pelvic Floor Lengthening  - 1 x daily - 7 x weekly - 1 sets - 1 reps - 30 sec hold   ASSESSMENT:  CLINICAL IMPRESSION: Patient is a 23 y.o. F who was seen today for physical therapy  treatment for mixed incontinence and dyspareunia. Patient has pelvis in correct alignment after manual work. She understands how to perform scar work on her lower abdomen. Her c-section scar is sensitive.  She was able to contract her abdominals by the end of treatment. She has tightness in the thoracic region. Patient's quality of life has been affected, patient will benefit from physical therapy to address deficits, reduce SUI and improve abdominal strength and pain with pelvic exams and intercourse.    OBJECTIVE IMPAIRMENTS: decreased ROM, decreased strength, increased fascial restrictions, increased muscle spasms, impaired tone, and pain.   ACTIVITY LIMITATIONS: carrying, lifting, continence, and toileting  PARTICIPATION LIMITATIONS: interpersonal relationship and community activity  PERSONAL FACTORS: Age and Time since onset of injury/illness/exacerbation are also affecting patient's functional outcome.   REHAB POTENTIAL:  Good  CLINICAL DECISION MAKING: Evolving/moderate complexity  EVALUATION COMPLEXITY: Moderate   GOALS: Goals reviewed with patient? Yes  SHORT TERM GOALS: Target date: 11/08/2023     Pt will be I and consistent with her HEP and demonstrate all exercises correctly  Baseline: Goal status: INITIAL  2.  Patient will be educated on dilators to reduce pelvic pain with intercourse and pelvic exams  Baseline:  Goal status: INITIAL  3.  Patient will be educated on cupping and scar massage Baseline:  Goal status: Met 11/30/23   LONG TERM GOALS: Target date: 04/12/2023  Patient able to demonstrate correct pressure management to reduce strain on her pelvic floor.   Baseline:  Goal status: INITIAL  2.   Pt will be independent with dilator program and progressions to decrease vaginal sensitization and return to pain free vaginal penetration. Baseline:  Goal status: INITIAL  3.  Pt will soak 0 pads/ day in order to run errands and not have to interrupt daily tasks.  Baseline: changes liners several times/day Goal status: INITIAL  4.  Pt will demonstrate 20 point improvement in PFIQ-7 score in order to show functional improvement in urinary incontinence.   Baseline: 24 Goal status: INITIAL  5.  Patient will report 0/10 pain in abdominal scar Baseline:  Goal status: INITIAL  6.  Patient will demonstrate improved abdominal strength and bilateral hip strength to 4/5 at least.              Baseline:  Goal status: INITIAL  PLAN:  PT FREQUENCY: 1-2x/week  PT DURATION: 6 months  PLANNED INTERVENTIONS: 97110-Therapeutic exercises, 97530- Therapeutic activity, 97112- Neuromuscular re-education, 97535- Self Care, [id number]- Manual therapy, [phone]- Electrical stimulation (manual), [phone] (1-2 muscles), 20561 (3+ muscles)- Dry Needling, Patient/Family education, Taping, Joint mobilization, Joint manipulation, Spinal manipulation, Spinal mobilization, Manual lymph drainage, Scar mobilization,  Cryotherapy, Moist heat, and Biofeedback  PLAN FOR NEXT SESSION: internal muscle assessment of the pelvic floor, education on dilators, start strengthening of the core and back and down training of pelvic floor  [name], PT 11/30/23 4:13 PM

## 2023-12-05 ENCOUNTER — Ambulatory Visit: Admitting: Physical Therapy

## 2023-12-05 ENCOUNTER — Telehealth: Payer: Self-pay | Admitting: Physical Therapy

## 2023-12-05 NOTE — Telephone Encounter (Signed)
 Called patient about her missed appointment today at 15:30 and left a message.  [name], PT @9 /8/25@ 3:51 PM

## 2023-12-20 ENCOUNTER — Ambulatory Visit: Admitting: Physical Therapy

## 2023-12-20 ENCOUNTER — Encounter: Payer: Self-pay | Admitting: Physical Therapy

## 2023-12-20 DIAGNOSIS — M6281 Muscle weakness (generalized): Secondary | ICD-10-CM | POA: Diagnosis not present

## 2023-12-20 DIAGNOSIS — R293 Abnormal posture: Secondary | ICD-10-CM

## 2023-12-20 NOTE — Therapy (Addendum)
 OUTPATIENT PHYSICAL THERAPY FEMALE PELVIC TREATMENT   Patient Name: Joan Kim MRN: [id number] DOB:October 13, 2000, 23 y.o., female Today's Date: 12/20/2023  END OF SESSION:  PT End of Session - 12/20/23 1449     Visit Number 3    Date for Recertification  04/12/23    Authorization Type [place] MEDICAID [employer]    PT Start Time 1450    PT Stop Time 1530    PT Time Calculation (min) 40 min    Activity Tolerance Patient tolerated treatment well    Behavior During Therapy [facility] for tasks assessed/performed          Past Medical History:  Diagnosis Date   Acute pyelonephritis 06/04/2023   Asthma    Bacterial vaginitis    Dyspepsia 08/30/2019   Fatigue 08/12/2020   GERD (gastroesophageal reflux disease)    Gestational diabetes mellitus (GDM) affecting pregnancy, antepartum 03/11/2023   Normal 2 hr postpartum GTT   H/O bladder infections    Hashimoto's thyroiditis 08/30/2019   Helicobacter pylori antibody positive 03/2019   Insomnia 09/18/2020   Migraine without aura and without status migrainosus, not intractable 04/23/2020   Mild preeclampsia 05/18/2023   Nausea/vomiting in pregnancy 10/22/2022   Near syncope 11/03/2022   Past Surgical History:  Procedure Laterality Date   CESAREAN SECTION N/A 05/20/2023   Procedure: CESAREAN SECTION;  Surgeon: Kandis Devaughn Sayres, MD;  Location: [facility] LD ORS;  Service: Obstetrics;  Laterality: N/A;   WISDOM TOOTH EXTRACTION     Patient Active Problem List   Diagnosis Date Noted   Feeling of incomplete bladder emptying 10/24/2023   Urinary incontinence, mixed 10/24/2023   Dyspareunia in female 10/24/2023   Nocturia 10/24/2023   Frequent UTI 10/24/2023   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   Pelvic pain 10/24/2023   Abnormal urinalysis 10/24/2023   Pelvic organ prolapse quantification stage 1 cystocele 10/24/2023   Fever 06/04/2023   Leukocytosis 06/04/2023   Hypokalemia 06/04/2023   GAD (generalized anxiety disorder) 06/04/2023    Chronic anemia 06/04/2023   Family history of spina bifida 11/18/2022   Mild intermittent asthma 11/18/2022   Iron  deficiency 12/26/2019   Anxiety and depression 10/04/2019   Hashimoto's thyroiditis 08/30/2019   Vitamin D deficiency 08/30/2019    PCP: Program, [facility]  REFERRING PROVIDER: Herchel Gloris LABOR, MD  REFERRING DIAG: R39.14 (ICD-10-CM) - Feeling of incomplete bladder emptying N81.2 (ICD-10-CM) - First degree uterine prolapse THERAPY DIAG:  Muscle weakness (generalized)  Abnormal posture  Rationale for Evaluation and Treatment: Rehabilitation  ONSET DATE: 04/2023  SUBJECTIVE:  SUBJECTIVE STATEMENT: Patient reports that she is doing well.  Felt good after last visit. Her back is really starting to hurt.  Had a UTI when she went to her Dr.  Ezella did not sleep and she had to take a nap last visit Returning to work soon.    11/30/23 I went to the urologist and not to do Kegels.   Patient had  a baby 5 months ago, she got induced, she pushed for 4 hours, in labor for 36 hours, ended up with a C section. Feels fine now, does something and then pees herself. It's not a whole lot, changes underwear. Sometimes she feels like she has to pee, and only a little about it.  Has had PT for her knees.  She hemorrhaged after the C section, she could have died, she had kidney infection and sepsis, foley catheter, a green thing for 2 day. She got weak.  Feels good now, does not like that she is peeing herself.  Has common UTIs, every year.  Speculum always uncomfortable, does not use tampons.  Had really painful periods Fluid intake: water   PAIN:  Are you having pain? Yes scar- abdominal scar NPRS scale: up to 5/10   PRECAUTIONS: None  RED  FLAGS: None   WEIGHT BEARING RESTRICTIONS: No  FALLS:  Has patient fallen in last 6 months? 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  ACTIVITY LEVEL : active, window shops, gets out of the house, does not exercise  PLOF: Independent  PATIENT GOALS: stay dry  PERTINENT HISTORY:  C section 04/2023 Sexual abuse: No  BOWEL MOVEMENT: sometimes constipation- every 2 days or so Pain with bowel movement: Yes a lot of times, has pain with BMs, normal for her, stuck a finger  Type of bowel movement:Type (Bristol Stool Scale) 3-4 Fully empty rectum: No Leakage: No Pads: No Fiber supplement/laxative Yes   URINATION: Pain with urination: No Fully empty bladder: No- does a full pee and has to go back Stream: Strong Urgency: Yes  Frequency: yes Leakage: Urge to void, Coughing, Sneezing, Laughing, Exercise, and Lifting Bowling, Skating Pads: Yes:    INTERCOURSE:  Ability to have vaginal penetration Yes  Pain with intercourse: Initial Penetration, During Penetration, Deep Penetration, and After Intercourse- worse now, always hurt even before baby DrynessNo Climax: no- never had one Marinoff Scale: 1/3 Laxative:  PREGNANCY: Vaginal deliveries 0 Tearing No Episiotomy No C-section deliveries 1 Currently pregnant No  PROLAPSE: None   OBJECTIVE:  Note: Objective measures were completed at Evaluation unless otherwise noted.    PFIQ-7: 24  COGNITION: Overall cognitive status: Within functional limits for tasks assessed     SENSATION: Light touch: Appears intact  LUMBAR SPECIAL TESTS:  Straight leg raise test: Positive for compensation with rotation of abdomen and pelvis      POSTURE: rounded shoulders, forward head, and increased lumbar lordosis   LUMBARAROM/PROM: full   LOWER EXTREMITY ROM: full   LOWER EXTREMITY MMT: weak hips and knees bilateral  4/5  PALPATION:   General: upper chest breathing strategies  Pelvic Alignment: even 11/30/23: left ilium rotated  posteriorly, positive compression of the left ilium  Abdominal: tight and hypersensitive abdominal scar                External Perineal Exam: mild dryness present                             Internal Pelvic Floor: tends to  push therapists finger out when wanting to engage  Patient confirms identification and approves PT to assess internal pelvic floor and treatment Yes  PELVIC MMT:   MMT eval  Vaginal 0/5  Internal Anal Sphincter   External Anal Sphincter   Puborectalis   Diastasis Recti 2 fingers at and above umbilicus  (Blank rows = not tested)        TONE:    high  PROLAPSE: None noticed in hook lying   TODAY'S TREATMENT:   12/20/2023 Manual:  Internal pelvic floor muscle assessment Supine hip flexion muscle energy treatment for leg length discrepancy/ lower extremity unevenness Review and update of exercise program C section scar massage performed and patient educated on        11/30/23 Manual: Scar tissue mobilization: Manual work to the c-section scar to improve mobility and educated patient on how to mobilize at home Myofascial release: Tissue rolling around the lower rib cage and abdomen Fascial release around the urachus ligament and lower abdomen Spinal mobilization: PA and rotational mobilization to T8-L5 grade 3 PA mobilization to bilateral SI joint Neuromuscular re-education: Core retraining: Supine diaphragmatic breathing and as she breaths out to contract her abdominals with tactile cues to contract the abdominals and not lift the upper rib cage Exercises: Stretches/mobility: Supine piriformis stretch holding 30 sec bil.  Quadruped rock back and forth with hips internally rotated Frog stretch but hurt her inner thigh.  Sitting v stretch holding 30 sec  Happy baby holding 30 sec                                                                                                                              DATE: 10/11/2023   EVAL Examination  completed, findings reviewed, pt educated on POC, HEP, and female pelvic floor anatomy, reasoning with pelvic floor assessment internally with pt consent. Pt motivated to participate in PT and agreeable to attempt recommendations.    Manual- abdominal scar massage and cupping  PATIENT EDUCATION:  11/30/23 Education details:   Access Code: [email or web address]; education on scar massage Person educated: Patient Education method: Explanation, Demonstration, Tactile cues, Verbal cues, and Handouts Education comprehension: verbalized understanding, returned demonstration, verbal cues required, tactile cues required, and needs further education  HOME EXERCISE PROGRAM: 11/30/23 Access Code: [email or web address] URL: [email or web address] Date: 11/30/2023 Prepared by: [name]  Exercises - Supine Hip Adduction Isometric with Ball  - 1 x daily - 7 x weekly - 2 sets - 10 reps - Standing Shoulder Diagonal Horizontal Abduction 60/120 Degrees with Resistance  - 1 x daily - 7 x weekly - 2 sets - 10 reps - Hooklying Transversus Abdominis Palpation  - 1 x daily - 7 x weekly - 1 sets - 10 reps - Supine Piriformis Stretch  - 1 x daily - 7 x weekly - 1 sets - 1 reps - 30 sec hold - V Sit Hip Adductor Hamstring Stretch  - 1 x daily - 7 x weekly -  1 sets - 1 reps - 30 sec hold - Happy Baby with Pelvic Floor Lengthening  - 1 x daily - 7 x weekly - 1 sets - 1 reps - 30 sec hold   ASSESSMENT:  CLINICAL IMPRESSION: Patient is a 23 y.o. F who was seen today for physical therapy  treatment for mixed incontinence and dyspareunia. Patient with high tone pelvic floor with no contraction present and wants to bulge. Some leg length discrepancy present. Abdominal scar still sensitive but improved.  Patient's quality of life has been affected, patient will benefit from physical therapy to address deficits, reduce SUI and improve abdominal strength and pain with pelvic exams and intercourse.    OBJECTIVE IMPAIRMENTS: decreased  ROM, decreased strength, increased fascial restrictions, increased muscle spasms, impaired tone, and pain.   ACTIVITY LIMITATIONS: carrying, lifting, continence, and toileting  PARTICIPATION LIMITATIONS: interpersonal relationship and community activity  PERSONAL FACTORS: Age and Time since onset of injury/illness/exacerbation are also affecting patient's functional outcome.   REHAB POTENTIAL: Good  CLINICAL DECISION MAKING: Evolving/moderate complexity  EVALUATION COMPLEXITY: Moderate   GOALS: Goals reviewed with patient? Yes  SHORT TERM GOALS: Target date: 11/08/2023     Pt will be I and consistent with her HEP and demonstrate all exercises correctly  Baseline: Goal status: INITIAL  2.  Patient will be educated on dilators to reduce pelvic pain with intercourse and pelvic exams  Baseline:  Goal status: INITIAL  3.  Patient will be educated on cupping and scar massage Baseline:  Goal status: Met 11/30/23   LONG TERM GOALS: Target date: 04/12/2023  Patient able to demonstrate correct pressure management to reduce strain on her pelvic floor.   Baseline:  Goal status: INITIAL  2.   Pt will be independent with dilator program and progressions to decrease vaginal sensitization and return to pain free vaginal penetration. Baseline:  Goal status: INITIAL  3.  Pt will soak 0 pads/ day in order to run errands and not have to interrupt daily tasks.  Baseline: changes liners several times/day Goal status: INITIAL  4.  Pt will demonstrate 20 point improvement in PFIQ-7 score in order to show functional improvement in urinary incontinence.   Baseline: 24 Goal status: INITIAL  5.  Patient will report 0/10 pain in abdominal scar Baseline:  Goal status: INITIAL  6.  Patient will demonstrate improved abdominal strength and bilateral hip strength to 4/5 at least.              Baseline:  Goal status: INITIAL  PLAN:  PT FREQUENCY: 1-2x/week  PT DURATION: 6 months  PLANNED  INTERVENTIONS: 97110-Therapeutic exercises, 97530- Therapeutic activity, 97112- Neuromuscular re-education, 97535- Self Care, 02859- Manual therapy, [phone]- Electrical stimulation (manual), [phone] (1-2 muscles), 20561 (3+ muscles)- Dry Needling, Patient/Family education, Taping, Joint mobilization, Joint manipulation, Spinal manipulation, Spinal mobilization, Manual lymph drainage, Scar mobilization, Cryotherapy, Moist heat, and Biofeedback  PLAN FOR NEXT SESSION: education on dilators, start strengthening of the core and back and down training of pelvic floor, diaphragmatic breathing with activities, stretches   Cori [place], PT, DPT  [facility] [address] [place], [place] [address] Phone # [phone] Fax [phone]   12/20/23 2:53 PM

## 2023-12-27 ENCOUNTER — Encounter: Payer: Self-pay | Admitting: Physical Therapy

## 2023-12-27 ENCOUNTER — Ambulatory Visit: Admitting: Physical Therapy

## 2023-12-27 DIAGNOSIS — M6281 Muscle weakness (generalized): Secondary | ICD-10-CM

## 2023-12-27 DIAGNOSIS — R293 Abnormal posture: Secondary | ICD-10-CM

## 2023-12-27 NOTE — Therapy (Signed)
 OUTPATIENT PHYSICAL THERAPY FEMALE PELVIC TREATMENT   Patient Name: Joan Kim MRN: [id number] DOB:2000-11-22, 23 y.o., female Today's Date: 12/27/2023  END OF SESSION:  PT End of Session - 12/27/23 1444     Visit Number 4    Date for Recertification  04/12/23    Authorization Type [place] MEDICAID [employer]    PT Start Time 1445    PT Stop Time 1530    PT Time Calculation (min) 45 min    Activity Tolerance Patient tolerated treatment well    Behavior During Therapy [facility] for tasks assessed/performed          Past Medical History:  Diagnosis Date   Acute pyelonephritis 06/04/2023   Asthma    Bacterial vaginitis    Dyspepsia 08/30/2019   Fatigue 08/12/2020   GERD (gastroesophageal reflux disease)    Gestational diabetes mellitus (GDM) affecting pregnancy, antepartum 03/11/2023   Normal 2 hr postpartum GTT   H/O bladder infections    Hashimoto's thyroiditis 08/30/2019   Helicobacter pylori antibody positive 03/2019   Insomnia 09/18/2020   Migraine without aura and without status migrainosus, not intractable 04/23/2020   Mild preeclampsia 05/18/2023   Nausea/vomiting in pregnancy 10/22/2022   Near syncope 11/03/2022   Past Surgical History:  Procedure Laterality Date   CESAREAN SECTION N/A 05/20/2023   Procedure: CESAREAN SECTION;  Surgeon: Kandis Devaughn Sayres, MD;  Location: [facility] LD ORS;  Service: Obstetrics;  Laterality: N/A;   WISDOM TOOTH EXTRACTION     Patient Active Problem List   Diagnosis Date Noted   Feeling of incomplete bladder emptying 10/24/2023   Urinary incontinence, mixed 10/24/2023   Dyspareunia in female 10/24/2023   Nocturia 10/24/2023   Frequent UTI 10/24/2023   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   Pelvic pain 10/24/2023   Abnormal urinalysis 10/24/2023   Pelvic organ prolapse quantification stage 1 cystocele 10/24/2023   Fever 06/04/2023   Leukocytosis 06/04/2023   Hypokalemia 06/04/2023   GAD (generalized anxiety disorder) 06/04/2023    Chronic anemia 06/04/2023   Family history of spina bifida 11/18/2022   Mild intermittent asthma 11/18/2022   Iron  deficiency 12/26/2019   Anxiety and depression 10/04/2019   Hashimoto's thyroiditis 08/30/2019   Vitamin D deficiency 08/30/2019    PCP: Program, [facility]  REFERRING PROVIDER: Herchel Gloris LABOR, MD  REFERRING DIAG: R39.14 (ICD-10-CM) - Feeling of incomplete bladder emptying N81.2 (ICD-10-CM) - First degree uterine prolapse THERAPY DIAG:  Muscle weakness (generalized)  Abnormal posture  Rationale for Evaluation and Treatment: Rehabilitation  ONSET DATE: 04/2023  SUBJECTIVE:  SUBJECTIVE STATEMENT: Patient reports that  her back hurts. Has been doing a lot of standing at work- went to work today for the first time Urinary incontinence feels ok Could not poop, peeing is OK Feels constipated ( taking iron  supplement) Her biggest thing is pain with intercourse and low back pain     Patient reports that she is doing well.  Felt good after last visit. Her back is really starting to hurt.  Had a UTI when she went to her Dr.  Ezella did not sleep and she had to take a nap last visit Returning to work soon.    11/30/23 I went to the urologist and not to do Kegels.   Patient had  a baby 5 months ago, she got induced, she pushed for 4 hours, in labor for 36 hours, ended up with a C section. Feels fine now, does something and then pees herself. It's not a whole lot, changes underwear. Sometimes she feels like she has to pee, and only a little about it.  Has had PT for her knees.  She hemorrhaged after the C section, she could have died, she had kidney infection and sepsis, foley catheter, a green thing for 2 day. She got weak.  Feels good now, does not like  that she is peeing herself.  Has common UTIs, every year.  Speculum always uncomfortable, does not use tampons.  Had really painful periods Fluid intake: water   PAIN:  Are you having pain? Yes scar- abdominal scar NPRS scale: up to 5/10   PRECAUTIONS: None  RED FLAGS: None   WEIGHT BEARING RESTRICTIONS: No  FALLS:  Has patient fallen in last 6 months? 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  ACTIVITY LEVEL : active, window shops, gets out of the house, does not exercise  PLOF: Independent  PATIENT GOALS: stay dry  PERTINENT HISTORY:  C section 04/2023 Sexual abuse: No  BOWEL MOVEMENT: sometimes constipation- every 2 days or so Pain with bowel movement: Yes a lot of times, has pain with BMs, normal for her, stuck a finger  Type of bowel movement:Type (Bristol Stool Scale) 3-4 Fully empty rectum: No Leakage: No Pads: No Fiber supplement/laxative Yes   URINATION: Pain with urination: No Fully empty bladder: No- does a full pee and has to go back Stream: Strong Urgency: Yes  Frequency: yes Leakage: Urge to void, Coughing, Sneezing, Laughing, Exercise, and Lifting Bowling, Skating Pads: Yes:    INTERCOURSE:  Ability to have vaginal penetration Yes  Pain with intercourse: Initial Penetration, During Penetration, Deep Penetration, and After Intercourse- worse now, always hurt even before baby DrynessNo Climax: no- never had one Marinoff Scale: 1/3 Laxative:  PREGNANCY: Vaginal deliveries 0 Tearing No Episiotomy No C-section deliveries 1 Currently pregnant No  PROLAPSE: None   OBJECTIVE:  Note: Objective measures were completed at Evaluation unless otherwise noted.    PFIQ-7: 24  COGNITION: Overall cognitive status: Within functional limits for tasks assessed     SENSATION: Light touch: Appears intact  LUMBAR SPECIAL TESTS:  Straight leg raise test: Positive for compensation with rotation of abdomen and pelvis      POSTURE: rounded shoulders,  forward head, and increased lumbar lordosis   LUMBARAROM/PROM: full   LOWER EXTREMITY ROM: full   LOWER EXTREMITY MMT: weak hips and knees bilateral  4/5  PALPATION:   General: upper chest breathing strategies  Pelvic Alignment: even 11/30/23: left ilium rotated posteriorly, positive compression of the left ilium  Abdominal: tight and hypersensitive abdominal  scar                External Perineal Exam: mild dryness present                             Internal Pelvic Floor: tends to push therapists finger out when wanting to engage  Patient confirms identification and approves PT to assess internal pelvic floor and treatment Yes  PELVIC MMT:   MMT eval  Vaginal 0/5  Internal Anal Sphincter   External Anal Sphincter   Puborectalis   Diastasis Recti 2 fingers at and above umbilicus  (Blank rows = not tested)        TONE:    high  PROLAPSE: None noticed in hook lying   TODAY'S TREATMENT:   Manual- addaday left paraspinals, bilateral glutes Abdominal massage for constipation  Neuro reed- single knee to chest stretch                             Diaphragmatic breathing                        Cat/ cow with diaphragmatic breathing  10 reps                          Bridging with transverse abdominis breath  10 reps                              Seated horizontal abduction with green thera band with transverse abdominis breath for pelvic floor contraction  Self care - healthy bowel habits    12/20/2023 Manual:  Internal pelvic floor muscle assessment Supine hip flexion muscle energy treatment for leg length discrepancy/ lower extremity unevenness Review and update of exercise program C section scar massage performed and patient educated on        11/30/23 Manual: Scar tissue mobilization: Manual work to the c-section scar to improve mobility and educated patient on how to mobilize at home Myofascial release: Tissue rolling around the lower rib cage and  abdomen Fascial release around the urachus ligament and lower abdomen Spinal mobilization: PA and rotational mobilization to T8-L5 grade 3 PA mobilization to bilateral SI joint Neuromuscular re-education: Core retraining: Supine diaphragmatic breathing and as she breaths out to contract her abdominals with tactile cues to contract the abdominals and not lift the upper rib cage Exercises: Stretches/mobility: Supine piriformis stretch holding 30 sec bil.  Quadruped rock back and forth with hips internally rotated Frog stretch but hurt her inner thigh.  Sitting v stretch holding 30 sec  Happy baby holding 30 sec  DATE: 10/11/2023   EVAL Examination completed, findings reviewed, pt educated on POC, HEP, and female pelvic floor anatomy, reasoning with pelvic floor assessment internally with pt consent. Pt motivated to participate in PT and agreeable to attempt recommendations.    Manual- abdominal scar massage and cupping  PATIENT EDUCATION:  11/30/23 Education details:   Access Code: [email or web address]; education on scar massage Person educated: Patient Education method: Explanation, Demonstration, Tactile cues, Verbal cues, and Handouts Education comprehension: verbalized understanding, returned demonstration, verbal cues required, tactile cues required, and needs further education  HOME EXERCISE PROGRAM: 11/30/23 Access Code: [email or web address] URL: [email or web address] Date: 11/30/2023 Prepared by: [name]  Exercises - Supine Hip Adduction Isometric with Ball  - 1 x daily - 7 x weekly - 2 sets - 10 reps - Standing Shoulder Diagonal Horizontal Abduction 60/120 Degrees with Resistance  - 1 x daily - 7 x weekly - 2 sets - 10 reps - Hooklying Transversus Abdominis Palpation  - 1 x daily - 7 x weekly - 1 sets - 10 reps - Supine Piriformis Stretch  - 1 x daily - 7 x  weekly - 1 sets - 1 reps - 30 sec hold - V Sit Hip Adductor Hamstring Stretch  - 1 x daily - 7 x weekly - 1 sets - 1 reps - 30 sec hold - Happy Baby with Pelvic Floor Lengthening  - 1 x daily - 7 x weekly - 1 sets - 1 reps - 30 sec hold   ASSESSMENT:  CLINICAL IMPRESSION: Patient is a 23 y.o. F who was seen today for physical therapy  treatment for mixed incontinence and dyspareunia. Patient with high tone pelvic floor with no contraction present and wants to bulge. Some leg length discrepancy present. Abdominal scar still sensitive but improved. Patient did well with manual therapy for low back pain and cor constipation. Bridging was hard. She still has postpartum core weakness. Discussed importance of HEP. Patient's quality of life has been affected, patient will benefit from physical therapy to address deficits, reduce SUI and improve abdominal strength and pain with pelvic exams and intercourse.    OBJECTIVE IMPAIRMENTS: decreased ROM, decreased strength, increased fascial restrictions, increased muscle spasms, impaired tone, and pain.   ACTIVITY LIMITATIONS: carrying, lifting, continence, and toileting  PARTICIPATION LIMITATIONS: interpersonal relationship and community activity  PERSONAL FACTORS: Age and Time since onset of injury/illness/exacerbation are also affecting patient's functional outcome.   REHAB POTENTIAL: Good  CLINICAL DECISION MAKING: Evolving/moderate complexity  EVALUATION COMPLEXITY: Moderate   GOALS: Goals reviewed with patient? Yes  SHORT TERM GOALS: Target date: 11/08/2023     Pt will be I and consistent with her HEP and demonstrate all exercises correctly  Baseline: Goal status: INITIAL  2.  Patient will be educated on dilators to reduce pelvic pain with intercourse and pelvic exams  Baseline:  Goal status: INITIAL  3.  Patient will be educated on cupping and scar massage Baseline:  Goal status: Met 11/30/23   LONG TERM GOALS: Target date:  04/12/2023  Patient able to demonstrate correct pressure management to reduce strain on her pelvic floor.   Baseline:  Goal status: INITIAL  2.   Pt will be independent with dilator program and progressions to decrease vaginal sensitization and return to pain free vaginal penetration. Baseline:  Goal status: INITIAL  3.  Pt will soak 0 pads/ day in order to run errands and not have to interrupt daily tasks.  Baseline: changes liners several times/day Goal status: INITIAL  4.  Pt will demonstrate 20 point improvement in PFIQ-7 score in order to show functional improvement in urinary incontinence.   Baseline: 24 Goal status: INITIAL  5.  Patient will report 0/10 pain in abdominal scar Baseline:  Goal status: INITIAL  6.  Patient will demonstrate improved abdominal strength and bilateral hip strength to 4/5 at least.              Baseline:  Goal status: INITIAL  PLAN:  PT FREQUENCY: 1-2x/week  PT DURATION: 6 months  PLANNED INTERVENTIONS: 97110-Therapeutic exercises, 97530- Therapeutic activity, 97112- Neuromuscular re-education, 97535- Self Care, [id number]- Manual therapy, [phone]- Electrical stimulation (manual), [phone] (1-2 muscles), [id number] (3+ muscles)- Dry Needling, Patient/Family education, Taping, Joint mobilization, Joint manipulation, Spinal manipulation, Spinal mobilization, Manual lymph drainage, Scar mobilization, Cryotherapy, Moist heat, and Biofeedback  PLAN FOR NEXT SESSION: education on dilators, start strengthening of the core and back and down training of pelvic floor, diaphragmatic breathing with activities, stretches   Cori Florida, PT, DPT  [facility] [address] [place], [place] [address] Phone # [phone] Fax [phone]   12/27/23 2:44 PM

## 2024-01-03 ENCOUNTER — Ambulatory Visit: Attending: Obstetrics & Gynecology | Admitting: Physical Therapy

## 2024-01-03 ENCOUNTER — Encounter: Payer: Self-pay | Admitting: Physical Therapy

## 2024-01-03 DIAGNOSIS — M6281 Muscle weakness (generalized): Secondary | ICD-10-CM | POA: Diagnosis present

## 2024-01-03 DIAGNOSIS — R293 Abnormal posture: Secondary | ICD-10-CM | POA: Diagnosis present

## 2024-01-03 NOTE — Therapy (Addendum)
 OUTPATIENT PHYSICAL THERAPY FEMALE PELVIC TREATMENT   Patient Name: Joan Kim MRN: [id number] DOB:[birth year], 23 y.o., female Today's Date: 01/03/2024  END OF SESSION:  PT End of Session - 01/03/24 1455     Visit Number 5    Date for Recertification  04/12/23    Authorization Type [place] MEDICAID [employer]    PT Start Time 1454    PT Stop Time 1532    PT Time Calculation (min) 38 min    Activity Tolerance Patient tolerated treatment well    Behavior During Therapy [facility] for tasks assessed/performed          Past Medical History:  Diagnosis Date   Acute pyelonephritis 06/04/2023   Asthma    Bacterial vaginitis    Dyspepsia 08/30/2019   Fatigue 08/12/2020   GERD (gastroesophageal reflux disease)    Gestational diabetes mellitus (GDM) affecting pregnancy, antepartum 03/11/2023   Normal 2 hr postpartum GTT   H/O bladder infections    Hashimoto's thyroiditis 08/30/2019   Helicobacter pylori antibody positive 03/2019   Insomnia 09/18/2020   Migraine without aura and without status migrainosus, not intractable 04/23/2020   Mild preeclampsia 05/18/2023   Nausea/vomiting in pregnancy 10/22/2022   Near syncope 11/03/2022   Past Surgical History:  Procedure Laterality Date   CESAREAN SECTION N/A 05/20/2023   Procedure: CESAREAN SECTION;  Surgeon: Kandis Devaughn Sayres, MD;  Location: [facility] LD ORS;  Service: Obstetrics;  Laterality: N/A;   WISDOM TOOTH EXTRACTION     Patient Active Problem List   Diagnosis Date Noted   Feeling of incomplete bladder emptying 10/24/2023   Urinary incontinence, mixed 10/24/2023   Dyspareunia in female 10/24/2023   Nocturia 10/24/2023   Frequent UTI 10/24/2023   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   Pelvic pain 10/24/2023   Abnormal urinalysis 10/24/2023   Pelvic organ prolapse quantification stage 1 cystocele 10/24/2023   Fever 06/04/2023   Leukocytosis 06/04/2023   Hypokalemia 06/04/2023   GAD (generalized anxiety disorder) 06/04/2023    Chronic anemia 06/04/2023   Family history of spina bifida 11/18/2022   Mild intermittent asthma 11/18/2022   Iron  deficiency 12/26/2019   Anxiety and depression 10/04/2019   Hashimoto's thyroiditis 08/30/2019   Vitamin D deficiency 08/30/2019    PCP: Program,  Family Medicine Residency  REFERRING PROVIDER: Herchel Gloris LABOR, MD  REFERRING DIAG: R39.14 (ICD-10-CM) - Feeling of incomplete bladder emptying N81.2 (ICD-10-CM) - First degree uterine prolapse THERAPY DIAG:  Muscle weakness (generalized)  Abnormal posture  Rationale for Evaluation and Treatment: Rehabilitation  ONSET DATE: 04/2023  SUBJECTIVE:  SUBJECTIVE STATEMENT: Patient reports that she back felt good after last visit. Back hurts a little at bra line.  Leaking has been good.  Has some discharge. It is very uncomfortable. Will reach out to her OB gyn Work is going ok, lifting boxes getting easier.  Constipation is good  Partner massages her scar, she likes that. Cupping was too intense, she has not been doing that Does 4 hour shifts at work, once/ week 8 hr shift. Max she lifts is 20 lbs Feels weak after her shift, depends on the day.        11/30/23 I went to the urologist and not to do Kegels.   Patient had  a baby 5 months ago, she got induced, she pushed for 4 hours, in labor for 36 hours, ended up with a C section. Feels fine now, does something and then pees herself. It's not a whole lot, changes underwear. Sometimes she feels like she has to pee, and only a little about it.  Has had PT for her knees.  She hemorrhaged after the C section, she could have died, she had kidney infection and sepsis, foley catheter, a green thing for 2 day. She got weak.  Feels good now, does not like that she is peeing herself.   Has common UTIs, every year.  Speculum always uncomfortable, does not use tampons.  Had really painful periods Fluid intake: water   PAIN:  Are you having pain? Yes scar- abdominal scar NPRS scale: up to 5/10   PRECAUTIONS: None  RED FLAGS: None   WEIGHT BEARING RESTRICTIONS: No  FALLS:  Has patient fallen in last 6 months? 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  ACTIVITY LEVEL : active, window shops, gets out of the house, does not exercise  PLOF: Independent  PATIENT GOALS: stay dry  PERTINENT HISTORY:  C section 04/2023 Sexual abuse: No  BOWEL MOVEMENT: sometimes constipation- every 2 days or so Pain with bowel movement: Yes a lot of times, has pain with BMs, normal for her, stuck a finger  Type of bowel movement:Type (Bristol Stool Scale) 3-4 Fully empty rectum: No Leakage: No Pads: No Fiber supplement/laxative Yes   URINATION: Pain with urination: No Fully empty bladder: No- does a full pee and has to go back Stream: Strong Urgency: Yes  Frequency: yes Leakage: Urge to void, Coughing, Sneezing, Laughing, Exercise, and Lifting Bowling, Skating Pads: Yes:    INTERCOURSE:  Ability to have vaginal penetration Yes  Pain with intercourse: Initial Penetration, During Penetration, Deep Penetration, and After Intercourse- worse now, always hurt even before baby DrynessNo Climax: no- never had one Marinoff Scale: 1/3 Laxative:  PREGNANCY: Vaginal deliveries 0 Tearing No Episiotomy No C-section deliveries 1 Currently pregnant No  PROLAPSE: None   OBJECTIVE:  Note: Objective measures were completed at Evaluation unless otherwise noted.    PFIQ-7: 24  COGNITION: Overall cognitive status: Within functional limits for tasks assessed     SENSATION: Light touch: Appears intact  LUMBAR SPECIAL TESTS:  Straight leg raise test: Positive for compensation with rotation of abdomen and pelvis      POSTURE: rounded shoulders, forward head, and increased  lumbar lordosis   LUMBARAROM/PROM: full   LOWER EXTREMITY ROM: full   LOWER EXTREMITY MMT: weak hips and knees bilateral  4/5  PALPATION:   General: upper chest breathing strategies  Pelvic Alignment: even 11/30/23: left ilium rotated posteriorly, positive compression of the left ilium  Abdominal: tight and hypersensitive abdominal scar  External Perineal Exam: mild dryness present                             Internal Pelvic Floor: tends to push therapists finger out when wanting to engage  Patient confirms identification and approves PT to assess internal pelvic floor and treatment Yes  PELVIC MMT:   MMT eval  Vaginal 0/5  Internal Anal Sphincter   External Anal Sphincter   Puborectalis   Diastasis Recti 2 fingers at and above umbilicus  (Blank rows = not tested)        TONE:    high  PROLAPSE: None noticed in hook lying   TODAY'S TREATMENT:  01/03/2024 Foam roller routine 10 reps each Foam roller prayer stretch Open books with foam roller 10 reps Addaday- Th, lumbar  paraspinals and glutes Ergonomic lifting reed #10 kb  12/27/2023 Manual- addaday left paraspinals, bilateral glutes Abdominal massage for constipation  Neuro reed- single knee to chest stretch                             Diaphragmatic breathing                        Cat/ cow with diaphragmatic breathing  10 reps                          Bridging with transverse abdominis breath  10 reps                              Seated horizontal abduction with green thera band with transverse abdominis breath for pelvic floor contraction  Self care - healthy bowel habits    12/20/2023 Manual:  Internal pelvic floor muscle assessment Supine hip flexion muscle energy treatment for leg length discrepancy/ lower extremity unevenness Review and update of exercise program C section scar massage performed and patient educated on        11/30/23 Manual: Scar tissue mobilization: Manual  work to the c-section scar to improve mobility and educated patient on how to mobilize at home Myofascial release: Tissue rolling around the lower rib cage and abdomen Fascial release around the urachus ligament and lower abdomen Spinal mobilization: PA and rotational mobilization to T8-L5 grade 3 PA mobilization to bilateral SI joint Neuromuscular re-education: Core retraining: Supine diaphragmatic breathing and as she breaths out to contract her abdominals with tactile cues to contract the abdominals and not lift the upper rib cage Exercises: Stretches/mobility: Supine piriformis stretch holding 30 sec bil.  Quadruped rock back and forth with hips internally rotated Frog stretch but hurt her inner thigh.  Sitting v stretch holding 30 sec  Happy baby holding 30 sec  DATE: 10/11/2023   EVAL Examination completed, findings reviewed, pt educated on POC, HEP, and female pelvic floor anatomy, reasoning with pelvic floor assessment internally with pt consent. Pt motivated to participate in PT and agreeable to attempt recommendations.    Manual- abdominal scar massage and cupping  PATIENT EDUCATION:  11/30/23 Education details:   Access Code: [email or web address]; education on scar massage Person educated: Patient Education method: Explanation, Demonstration, Tactile cues, Verbal cues, and Handouts Education comprehension: verbalized understanding, returned demonstration, verbal cues required, tactile cues required, and needs further education  HOME EXERCISE PROGRAM: 11/30/23 Access Code: [email or web address] URL: [email or web address] Date: 11/30/2023 Prepared by: [name]  Exercises - Supine Hip Adduction Isometric with Ball  - 1 x daily - 7 x weekly - 2 sets - 10 reps - Standing Shoulder Diagonal Horizontal Abduction 60/120 Degrees with Resistance  - 1 x daily - 7 x  weekly - 2 sets - 10 reps - Hooklying Transversus Abdominis Palpation  - 1 x daily - 7 x weekly - 1 sets - 10 reps - Supine Piriformis Stretch  - 1 x daily - 7 x weekly - 1 sets - 1 reps - 30 sec hold - V Sit Hip Adductor Hamstring Stretch  - 1 x daily - 7 x weekly - 1 sets - 1 reps - 30 sec hold - Happy Baby with Pelvic Floor Lengthening  - 1 x daily - 7 x weekly - 1 sets - 1 reps - 30 sec hold   ASSESSMENT:  CLINICAL IMPRESSION: Patient is a 23 y.o. F who was seen today for physical therapy  treatment for mixed incontinence and dyspareunia. Patient with high tone pelvic floor with no contraction present and wants to bulge. Some leg length discrepancy present. Abdominal scar still sensitive but improved. Patient did well with manual therapy for mid back pain. She still has postpartum core weakness. Had difficulty with foam roller routine- weak arms as well. Discussed importance of HEP. Patient's quality of life has been affected, patient will benefit from physical therapy to address deficits, reduce SUI and improve abdominal strength and pain with pelvic exams and intercourse.    OBJECTIVE IMPAIRMENTS: decreased ROM, decreased strength, increased fascial restrictions, increased muscle spasms, impaired tone, and pain.   ACTIVITY LIMITATIONS: carrying, lifting, continence, and toileting  PARTICIPATION LIMITATIONS: interpersonal relationship and community activity  PERSONAL FACTORS: Age and Time since onset of injury/illness/exacerbation are also affecting patient's functional outcome.   REHAB POTENTIAL: Good  CLINICAL DECISION MAKING: Evolving/moderate complexity  EVALUATION COMPLEXITY: Moderate   GOALS: Goals reviewed with patient? Yes  SHORT TERM GOALS: Target date: 11/08/2023     Pt will be I and consistent with her HEP and demonstrate all exercises correctly  Baseline: Goal status: ongoing 02/06/24  2.  Patient will be educated on dilators to reduce pelvic pain with intercourse  and pelvic exams  Baseline:  Goal status: met 02/06/24  3.  Patient will be educated on cupping and scar massage Baseline:  Goal status: Met 11/30/23   LONG TERM GOALS: Target date: 04/12/2023  Patient able to demonstrate correct pressure management to reduce strain on her pelvic floor.   Baseline:  Goal status: INITIAL  2.   Pt will be independent with dilator program and progressions to decrease vaginal sensitization and return to pain free vaginal penetration. Baseline:  Goal status: INITIAL  3.  Pt will soak 0 pads/ day in order to run errands and not have to interrupt daily tasks.  Baseline: changes liners several times/day  Goal status: INITIAL  4.  Pt will demonstrate 20 point improvement in PFIQ-7 score in order to show functional improvement in urinary incontinence.   Baseline: 24 Goal status: INITIAL  5.  Patient will report 0/10 pain in abdominal scar Baseline:  Goal status: INITIAL  6.  Patient will demonstrate improved abdominal strength and bilateral hip strength to 4/5 at least.              Baseline:  Goal status: INITIAL  PLAN:  PT FREQUENCY: 1-2x/week  PT DURATION: 6 months  PLANNED INTERVENTIONS: 97110-Therapeutic exercises, 97530- Therapeutic activity, 97112- Neuromuscular re-education, 97535- Self Care, [id number]- Manual therapy, [phone]- Electrical stimulation (manual), [phone] (1-2 muscles), [phone] (3+ muscles)- Dry Needling, Patient/Family education, Taping, Joint mobilization, Joint manipulation, Spinal manipulation, Spinal mobilization, Manual lymph drainage, Scar mobilization, Cryotherapy, Moist heat, and Biofeedback  PLAN FOR NEXT SESSION: education on dilators, start strengthening of the core and back and down training of pelvic floor, diaphragmatic breathing with activities, stretches   Cori Florida, PT, DPT  [facility] [address], [address] [place], [place] [address] Phone # [phone] Fax [phone]   01/03/24  2:56 PM

## 2024-01-03 NOTE — Progress Notes (Signed)
    Assessment & Plan:  1. SOB (shortness of breath) (Primary) - DG Chest 2 View; Future - ECHOCARDIOGRAM COMPLETE; Future   Patient Instructions  We are going to get a chest x-ray today.  We are going to get echocardiogram, this will be scheduled.  This will allow us  to see what is causing her murmur.  Remind your primary physician that we need to take a look at your iron  levels.  We are going to give you a trial of an inhaler called Advair (you may get the generic Wixela) this will be 1 puff twice a day.  You may review you the use of the inhaler in YouTube.  They have instructional videos.  We will see you in follow-up in 4 to 6 weeks time.  We will call you sooner should any of the lab tests need urgent attention.  Please note: late entry documentation due to logistical difficulties during COVID-19 pandemic. This note is filed for information purposes only, and is not intended to be used for billing, nor does it represent the full scope/nature of the visit in question. Please see any associated scanned media linked to date of encounter for additional pertinent information.  Subjective:    HPI: Joan Kim is a 23 y.o. female presenting to the pulmonology clinic on 11/06/2019 with report of: Follow-up (PFT 10/12/2019--pt states breathing has worsen since last OV. c/o increased wheezing mainly at night, fatigue and sob with exertion)     Outpatient Encounter Medications as of 11/06/2019  Medication Sig   [DISCONTINUED] albuterol  (VENTOLIN  HFA) 108 (90 Base) MCG/ACT inhaler Inhale 2 puffs into the lungs every 6 (six) hours as needed for wheezing or shortness of breath.   [DISCONTINUED] ergocalciferol (VITAMIN D2) 1.25 MG (50000 UT) capsule Take 50,000 Units by mouth once a week.   [DISCONTINUED] famotidine  (PEPCID ) 20 MG tablet Take 1 tablet (20 mg total) by mouth daily.   [DISCONTINUED] pantoprazole  (PROTONIX ) 40 MG tablet Take 40 mg by mouth daily. (Patient not taking:  Reported on 12/25/2019)   [DISCONTINUED] sertraline  (ZOLOFT ) 25 MG tablet TAKE 1 TABLET BY MOUTH EVERY DAY   [DISCONTINUED] VITAMIN D PO Take by mouth daily. (Patient not taking: Reported on 02/04/2023)   [DISCONTINUED] Fluticasone -Salmeterol (ADVAIR DISKUS) 250-50 MCG/DOSE AEPB Inhale 1 puff into the lungs in the morning and at bedtime.   No facility-administered encounter medications on file as of 11/06/2019.      Objective:   Vitals:   11/06/19 0935  BP: 122/70  Pulse: 84  Temp: 97.8 F (36.6 C)  Height: 5' 6 (1.676 m)  Weight: 161 lb 3.2 oz (73.1 kg)  SpO2: 99%  TempSrc: Temporal  BMI (Calculated): 26.03     Physical exam documentation is limited by delayed entry of information.

## 2024-01-10 ENCOUNTER — Encounter: Admitting: Physical Therapy

## 2024-01-13 DIAGNOSIS — E063 Autoimmune thyroiditis: Secondary | ICD-10-CM | POA: Diagnosis not present

## 2024-01-17 ENCOUNTER — Ambulatory Visit: Payer: Self-pay | Admitting: Physical Therapy

## 2024-01-24 ENCOUNTER — Ambulatory Visit: Admitting: Obstetrics

## 2024-02-07 ENCOUNTER — Ambulatory Visit: Admitting: Physical Therapy

## 2024-03-08 ENCOUNTER — Ambulatory Visit: Admitting: Physical Therapy
